# Patient Record
Sex: Male | Born: 1945 | Hispanic: No | Marital: Married | Smoking: Former smoker
Health system: Southern US, Community
[De-identification: ages and names within clinical notes are randomized; demographics above are authoritative.]

## PROBLEM LIST (undated history)

## (undated) DIAGNOSIS — Z8719 Personal history of other diseases of the digestive system: Secondary | ICD-10-CM

## (undated) DIAGNOSIS — R55 Syncope and collapse: Secondary | ICD-10-CM

## (undated) DIAGNOSIS — R509 Fever, unspecified: Secondary | ICD-10-CM

## (undated) DIAGNOSIS — N419 Inflammatory disease of prostate, unspecified: Secondary | ICD-10-CM

## (undated) DIAGNOSIS — Z973 Presence of spectacles and contact lenses: Secondary | ICD-10-CM

## (undated) DIAGNOSIS — R251 Tremor, unspecified: Secondary | ICD-10-CM

## (undated) DIAGNOSIS — I10 Essential (primary) hypertension: Secondary | ICD-10-CM

## (undated) DIAGNOSIS — W19XXXA Unspecified fall, initial encounter: Secondary | ICD-10-CM

## (undated) DIAGNOSIS — G4730 Sleep apnea, unspecified: Secondary | ICD-10-CM

## (undated) DIAGNOSIS — K219 Gastro-esophageal reflux disease without esophagitis: Secondary | ICD-10-CM

## (undated) HISTORY — PX: HIATAL HERNIA REPAIR: SHX195

## (undated) HISTORY — DX: Inflammatory disease of prostate, unspecified: N41.9

## (undated) HISTORY — DX: Fever, unspecified: R50.9

## (undated) HISTORY — PX: EYE SURGERY: SHX253

## (undated) HISTORY — PX: BACK SURGERY: SHX140

## (undated) HISTORY — PX: KNEE ARTHROSCOPY: SHX127

## (undated) HISTORY — PX: PATELLA FRACTURE SURGERY: SHX735

---

## 2001-07-16 ENCOUNTER — Encounter: Admission: RE | Admit: 2001-07-16 | Discharge: 2001-07-16 | Payer: Self-pay | Admitting: Orthopaedic Surgery

## 2001-07-16 ENCOUNTER — Encounter: Payer: Self-pay | Admitting: Orthopaedic Surgery

## 2002-05-24 ENCOUNTER — Encounter: Payer: Self-pay | Admitting: Cardiology

## 2002-05-24 ENCOUNTER — Inpatient Hospital Stay (HOSPITAL_COMMUNITY): Admission: EM | Admit: 2002-05-24 | Discharge: 2002-05-27 | Payer: Self-pay

## 2002-05-27 ENCOUNTER — Encounter: Payer: Self-pay | Admitting: Cardiology

## 2002-09-03 ENCOUNTER — Ambulatory Visit (HOSPITAL_COMMUNITY): Admission: RE | Admit: 2002-09-03 | Discharge: 2002-09-03 | Payer: Self-pay | Admitting: Gastroenterology

## 2004-03-23 ENCOUNTER — Encounter: Admission: RE | Admit: 2004-03-23 | Discharge: 2004-03-23 | Payer: Self-pay | Admitting: Family Medicine

## 2004-06-13 ENCOUNTER — Ambulatory Visit (HOSPITAL_COMMUNITY): Admission: RE | Admit: 2004-06-13 | Discharge: 2004-06-13 | Payer: Self-pay | Admitting: Internal Medicine

## 2005-01-24 ENCOUNTER — Ambulatory Visit: Payer: Self-pay | Admitting: Internal Medicine

## 2005-01-31 ENCOUNTER — Ambulatory Visit (HOSPITAL_COMMUNITY): Admission: RE | Admit: 2005-01-31 | Discharge: 2005-01-31 | Payer: Self-pay | Admitting: Internal Medicine

## 2005-02-08 ENCOUNTER — Ambulatory Visit: Payer: Self-pay | Admitting: Internal Medicine

## 2005-03-22 ENCOUNTER — Ambulatory Visit: Payer: Self-pay | Admitting: Internal Medicine

## 2005-08-12 ENCOUNTER — Encounter: Admission: RE | Admit: 2005-08-12 | Discharge: 2005-08-12 | Payer: Self-pay | Admitting: Internal Medicine

## 2005-10-05 ENCOUNTER — Ambulatory Visit: Payer: Self-pay | Admitting: Internal Medicine

## 2008-07-14 ENCOUNTER — Encounter: Payer: Self-pay | Admitting: Internal Medicine

## 2008-09-03 DIAGNOSIS — I712 Thoracic aortic aneurysm, without rupture, unspecified: Secondary | ICD-10-CM | POA: Insufficient documentation

## 2008-09-03 DIAGNOSIS — E119 Type 2 diabetes mellitus without complications: Secondary | ICD-10-CM

## 2008-09-03 DIAGNOSIS — M129 Arthropathy, unspecified: Secondary | ICD-10-CM | POA: Insufficient documentation

## 2008-09-03 DIAGNOSIS — E1165 Type 2 diabetes mellitus with hyperglycemia: Secondary | ICD-10-CM | POA: Insufficient documentation

## 2008-09-03 DIAGNOSIS — E669 Obesity, unspecified: Secondary | ICD-10-CM | POA: Insufficient documentation

## 2008-09-03 DIAGNOSIS — E785 Hyperlipidemia, unspecified: Secondary | ICD-10-CM | POA: Insufficient documentation

## 2008-09-04 ENCOUNTER — Ambulatory Visit: Payer: Self-pay | Admitting: Internal Medicine

## 2008-09-04 DIAGNOSIS — J309 Allergic rhinitis, unspecified: Secondary | ICD-10-CM | POA: Insufficient documentation

## 2008-09-04 LAB — CONVERTED CEMR LAB
Basophils Absolute: 0 10*3/uL (ref 0.0–0.1)
Basophils Relative: 0.3 % (ref 0.0–3.0)
Eosinophils Absolute: 0.2 10*3/uL (ref 0.0–0.7)
Eosinophils Relative: 4.3 % (ref 0.0–5.0)
HCT: 40.9 % (ref 39.0–52.0)
Hemoglobin: 13.4 g/dL (ref 13.0–17.0)
IgE (Immunoglobulin E), Serum: 523.6 intl units/mL — ABNORMAL HIGH (ref 0.0–180.0)
Lymphocytes Relative: 26.3 % (ref 12.0–46.0)
MCHC: 32.9 g/dL (ref 30.0–36.0)
MCV: 85.1 fL (ref 78.0–100.0)
Monocytes Absolute: 0.4 10*3/uL (ref 0.1–1.0)
Monocytes Relative: 7 % (ref 3.0–12.0)
Neutro Abs: 3.2 10*3/uL (ref 1.4–7.7)
Neutrophils Relative %: 62.1 % (ref 43.0–77.0)
Platelets: 227 10*3/uL (ref 150–400)
RBC: 4.8 M/uL (ref 4.22–5.81)
RDW: 15.1 % — ABNORMAL HIGH (ref 11.5–14.6)
WBC: 5.1 10*3/uL (ref 4.5–10.5)

## 2008-09-12 DIAGNOSIS — I10 Essential (primary) hypertension: Secondary | ICD-10-CM | POA: Insufficient documentation

## 2008-09-12 DIAGNOSIS — G4730 Sleep apnea, unspecified: Secondary | ICD-10-CM | POA: Insufficient documentation

## 2008-09-12 DIAGNOSIS — R05 Cough: Secondary | ICD-10-CM

## 2008-09-12 DIAGNOSIS — R059 Cough, unspecified: Secondary | ICD-10-CM | POA: Insufficient documentation

## 2008-09-18 ENCOUNTER — Encounter: Payer: Self-pay | Admitting: Internal Medicine

## 2008-09-30 ENCOUNTER — Ambulatory Visit: Payer: Self-pay | Admitting: Internal Medicine

## 2008-10-09 DIAGNOSIS — J45991 Cough variant asthma: Secondary | ICD-10-CM | POA: Insufficient documentation

## 2008-10-23 ENCOUNTER — Ambulatory Visit: Payer: Self-pay | Admitting: Internal Medicine

## 2009-11-15 ENCOUNTER — Encounter: Admission: RE | Admit: 2009-11-15 | Discharge: 2009-11-15 | Payer: Self-pay | Admitting: Internal Medicine

## 2009-11-29 ENCOUNTER — Ambulatory Visit (HOSPITAL_COMMUNITY): Admission: RE | Admit: 2009-11-29 | Discharge: 2009-11-30 | Payer: Self-pay | Admitting: General Surgery

## 2009-12-25 HISTORY — PX: OTHER SURGICAL HISTORY: SHX169

## 2010-03-17 ENCOUNTER — Ambulatory Visit (HOSPITAL_COMMUNITY): Admission: RE | Admit: 2010-03-17 | Discharge: 2010-03-17 | Payer: Self-pay | Admitting: Gastroenterology

## 2010-12-20 ENCOUNTER — Observation Stay (HOSPITAL_COMMUNITY)
Admission: RE | Admit: 2010-12-20 | Discharge: 2010-12-21 | Payer: Self-pay | Source: Home / Self Care | Attending: General Surgery | Admitting: General Surgery

## 2011-02-13 NOTE — Discharge Summary (Signed)
  NAME:  Kevin [name], HANNIS NO.:  [id number]  MEDICAL RECORD NO.:  [id number]          PATIENT TYPE:  OBV  LOCATION:  5126                         FACILITY:  [facility]  PHYSICIAN:  Cherylynn Ridges, M.D.    DATE OF BIRTH:  [birth year]  DATE OF ADMISSION:  12/20/2010 DATE OF DISCHARGE:  12/21/2010                              DISCHARGE SUMMARY   DISCHARGE DIAGNOSES:  Ventral hernia, comorbidities, non-insulin- dependent diabetes mellitus.  PRINCIPAL PROCEDURE:  Laparoscopic ventral hernia repair with Physiomesh.  SURGEON:  Cherylynn Ridges, MD.  ASSISTANT:  Dr. Janee Morn.  ANESTHESIA:  General endotracheal anesthesia.  He was discharged to home in care of his family.  DIET ON DISCHARGE:  Carbohydrate modified.  CONDITION:  Stable.  He had a postoperative med reconciliation sheet performed.  BRIEF SUMMARY AND HOSPITAL COURSE:  The patient was admitted just overnight after a laparoscopic ventral hernia repair, which went very well.  A piece of artificial mesh and Physiomesh was used to cover the defect.  He was secured in with a SecureStrap stapler.  Postoperatively, he did well with a binder in place, and was discharged home on a binder, and tolerating diabetic modified diet.  He will return to see me in 2 weeks.     Cherylynn Ridges, M.D.     JOW/MEDQ  D:  02/01/2011  T:  02/02/2011  Job:  [id number]  Electronically Signed by Jimmye Norman M.D. on 02/13/2011 12:44:13 PM

## 2011-03-06 LAB — DIFFERENTIAL
Basophils Absolute: 0.1 10*3/uL (ref 0.0–0.1)
Basophils Relative: 1 % (ref 0–1)
Eosinophils Absolute: 0.3 10*3/uL (ref 0.0–0.7)
Eosinophils Relative: 4 % (ref 0–5)
Lymphocytes Relative: 30 % (ref 12–46)
Lymphs Abs: 1.8 10*3/uL (ref 0.7–4.0)
Monocytes Absolute: 0.5 10*3/uL (ref 0.1–1.0)
Monocytes Relative: 7 % (ref 3–12)
Neutro Abs: 3.5 10*3/uL (ref 1.7–7.7)
Neutrophils Relative %: 57 % (ref 43–77)

## 2011-03-06 LAB — CBC
HCT: 40.8 % (ref 39.0–52.0)
Hemoglobin: 13 g/dL (ref 13.0–17.0)
MCH: 26.6 pg (ref 26.0–34.0)
MCHC: 31.9 g/dL (ref 30.0–36.0)
MCV: 83.6 fL (ref 78.0–100.0)
Platelets: 218 10*3/uL (ref 150–400)
RBC: 4.88 MIL/uL (ref 4.22–5.81)
RDW: 15.6 % — ABNORMAL HIGH (ref 11.5–15.5)
WBC: 6.1 10*3/uL (ref 4.0–10.5)

## 2011-03-06 LAB — GLUCOSE, CAPILLARY
Glucose-Capillary: 147 mg/dL — ABNORMAL HIGH (ref 70–99)
Glucose-Capillary: 184 mg/dL — ABNORMAL HIGH (ref 70–99)
Glucose-Capillary: 210 mg/dL — ABNORMAL HIGH (ref 70–99)
Glucose-Capillary: 253 mg/dL — ABNORMAL HIGH (ref 70–99)
Glucose-Capillary: 257 mg/dL — ABNORMAL HIGH (ref 70–99)
Glucose-Capillary: 257 mg/dL — ABNORMAL HIGH (ref 70–99)

## 2011-03-06 LAB — BASIC METABOLIC PANEL
BUN: 9 mg/dL (ref 6–23)
CO2: 28 mEq/L (ref 19–32)
Calcium: 9.4 mg/dL (ref 8.4–10.5)
Chloride: 103 mEq/L (ref 96–112)
Creatinine, Ser: 1.05 mg/dL (ref 0.4–1.5)
GFR calc Af Amer: >60 mL/min (ref >60)
GFR calc non Af Amer: >60 mL/min (ref >60)
Glucose, Bld: 177 mg/dL — ABNORMAL HIGH (ref 70–99)
Potassium: 4.5 mEq/L (ref 3.5–5.1)
Sodium: 138 mEq/L (ref 135–145)

## 2011-03-06 LAB — SURGICAL PCR SCREEN
MRSA, PCR: NEGATIVE
Staphylococcus aureus: POSITIVE — AB

## 2011-03-20 ENCOUNTER — Other Ambulatory Visit: Payer: Self-pay | Admitting: Orthopaedic Surgery

## 2011-03-20 DIAGNOSIS — M545 Low back pain, unspecified: Secondary | ICD-10-CM

## 2011-03-20 LAB — GLUCOSE, CAPILLARY: Glucose-Capillary: 124 mg/dL — ABNORMAL HIGH (ref 70–99)

## 2011-03-27 ENCOUNTER — Ambulatory Visit
Admission: RE | Admit: 2011-03-27 | Discharge: 2011-03-27 | Disposition: A | Discharge status: Home / Self Care | Payer: Medicare Other | Source: Ambulatory Visit | Attending: Orthopaedic Surgery | Admitting: Orthopaedic Surgery

## 2011-03-27 DIAGNOSIS — M545 Low back pain, unspecified: Secondary | ICD-10-CM

## 2011-03-28 LAB — DIFFERENTIAL
Basophils Absolute: 0.1 10*3/uL (ref 0.0–0.1)
Basophils Relative: 1 % (ref 0–1)
Eosinophils Absolute: 0.3 10*3/uL (ref 0.0–0.7)
Eosinophils Relative: 5 % (ref 0–5)
Lymphocytes Relative: 24 % (ref 12–46)
Lymphs Abs: 1.4 10*3/uL (ref 0.7–4.0)
Monocytes Absolute: 0.6 10*3/uL (ref 0.1–1.0)
Monocytes Relative: 10 % (ref 3–12)
Neutro Abs: 3.4 10*3/uL (ref 1.7–7.7)
Neutrophils Relative %: 60 % (ref 43–77)

## 2011-03-28 LAB — GLUCOSE, CAPILLARY
Glucose-Capillary: 126 mg/dL — ABNORMAL HIGH (ref 70–99)
Glucose-Capillary: 138 mg/dL — ABNORMAL HIGH (ref 70–99)
Glucose-Capillary: 147 mg/dL — ABNORMAL HIGH (ref 70–99)
Glucose-Capillary: 148 mg/dL — ABNORMAL HIGH (ref 70–99)
Glucose-Capillary: 151 mg/dL — ABNORMAL HIGH (ref 70–99)
Glucose-Capillary: 98 mg/dL (ref 70–99)

## 2011-03-28 LAB — COMPREHENSIVE METABOLIC PANEL
ALT: 22 U/L (ref 0–53)
AST: 21 U/L (ref 0–37)
Albumin: 3.4 g/dL — ABNORMAL LOW (ref 3.5–5.2)
Alkaline Phosphatase: 61 U/L (ref 39–117)
BUN: 11 mg/dL (ref 6–23)
CO2: 29 mEq/L (ref 19–32)
Calcium: 9.1 mg/dL (ref 8.4–10.5)
Chloride: 104 mEq/L (ref 96–112)
Creatinine, Ser: 1.02 mg/dL (ref 0.4–1.5)
GFR calc Af Amer: >60 mL/min (ref >60)
GFR calc non Af Amer: >60 mL/min (ref >60)
Glucose, Bld: 165 mg/dL — ABNORMAL HIGH (ref 70–99)
Potassium: 4.5 mEq/L (ref 3.5–5.1)
Sodium: 139 mEq/L (ref 135–145)
Total Bilirubin: 0.6 mg/dL (ref 0.3–1.2)
Total Protein: 7.2 g/dL (ref 6.0–8.3)

## 2011-03-28 LAB — CBC
HCT: 35.9 % — ABNORMAL LOW (ref 39.0–52.0)
Hemoglobin: 11.7 g/dL — ABNORMAL LOW (ref 13.0–17.0)
MCHC: 32.6 g/dL (ref 30.0–36.0)
MCV: 78.4 fL (ref 78.0–100.0)
Platelets: 281 10*3/uL (ref 150–400)
RBC: 4.58 MIL/uL (ref 4.22–5.81)
RDW: 16.7 % — ABNORMAL HIGH (ref 11.5–15.5)
WBC: 5.7 10*3/uL (ref 4.0–10.5)

## 2011-05-12 NOTE — H&P (Signed)
[place]  Patient:    Kevin [name], Kevin [name] Visit Number: [id number] MRN: [id number]          Service Type: MED Location: 5500 5524 02 Attending Physician:  Mirian Mo Dictated by:   Jesse Sans Wall, M.D. [facility] Admit Date:  05/24/2002 Discharge Date: 05/27/2002   CC:         Thora Lance, M.D.   History and Physical  CHIEF COMPLAINT:  "When I first got up this morning, I felt some tightness across my chest and some left arm numbness."  HISTORY OF PRESENT ILLNESS:  Mr. Kevin [name] is a delightful 65 year old African-American married male, who used to be a Producer, television/film/video, but now works for [employer].  He has been having episodic discomfort over the past week.  This morning, he noted the above complaint while working on a vacuum cleaner. It lasted for about 30 minutes.  He came to the emergency room where he was found to be mildly hypertensive, but no acute distress.  His EKG is essentially normal.  Initial enzymes were negative.  PAST MEDICAL HISTORY:  He has multiple cardiac risk factors including age, sex, type 2 diabetes, hypertension, and hyperlipidemia.  These are all being treated.  He does not smoke.  He is also overweight.  ALLERGIES:  He has no known drug allergies.  CURRENT MEDICATIONS: 1. Glucophage 1000 mg p.o. b.i.d. 2. Actos 45 mg p.o. q.d. 3. Amaryl 4 mg p.o. q.d. 4. Lotrel 5/20 mg one p.o. q.d. 5. Pravachol 20 mg p.o. q.h.s. 6. Aspirin 325 a day.  PAST SURGICAL HISTORY:  He has had previous right knee arthroscopy in September 2002.  SOCIAL HISTORY:  He lives in [place] with his wife.  He works at [employer].  He has not smoked in 30 years.  He rarely drinks alcohol.  He does not exercise regularly.  FAMILY HISTORY:  His mother had coronary disease and angina and died of cancer at age 71.  His father had coronary disease with an MI and died at age 59.  He has no siblings.  REVIEW OF  SYSTEMS:  Other than the above HPI is unremarkable.  PHYSICAL EXAMINATION:  GENERAL:  He is a very pleasant and intelligent male in no acute distress.  VITAL SIGNS:  Blood pressure 143/93, his pulse was 91 and regular.  He is in sinus rhythm.  Temperature is 99.3, his respiratory rate is 20 and unlabored. O2 saturations are adequate.  SKIN:  Warm and dry.  Exam is unremarkable.  HEENT:  Reveals him to be normocephalic, atraumatic.  Pupils equally responsive, reactive to light and accommodation.  Extraocular movements are intact.  Sclerae clear.  He has good dentition.  NECK:  Shows no JVD.  Carotid upstrokes are equal bilaterally without bruits. There is no thyromegaly.  Trachea is midline.  LUNGS:  Clear to auscultation and percussion.  HEART:  Reveals regular rate and rhythm without gallop, rub, or murmur.  ABDOMEN:  Soft, good bowel sounds.  There is no midline bruit.  There is no obvious tenderness or hepatomegaly.  EXTREMITIES:  There is no cyanosis, clubbing, or edema.  Pulses were brisk bilaterally.  LABORATORY:  His chest x-ray shows mild cardiomegaly, but is a portable film. He has a questionable left lower lobe nodule.  Two view chest is recommended.  EKG shows normal sinus rhythm and no ST segment changes.  Remarkable laboratory data shows negative CPK-MB and troponin x1.  His blood  sugar is 103, his creatinine is 1.0.  ASSESSMENT/PLAN: 1. Chest discomfort with left arm numbness, worrisome for angina or ischemic    symptoms.  The patient has multiple cardiac risk factors, and I suspect he    has obstructive coronary artery disease. 2. Type 2 diabetes. 3. Hypertension. 4. Hyperlipidemia. 5. Family history of coronary disease. 6. Questionable lung nodule on chest x-ray.  PLAN: 1. Admit to telemetry. 2. Lovenox and continue his current medications.  Will also add a beta    blocker. 3. Stop Glucophage the evening before the catheterization on Monday. 4. Two  view chest x-ray to rule out left lower lobe nodule.  Indications, risks, potential benefits of the procedure, being catheterization on Monday, have been discussed with him and his wife.  They are in agreement to proceed. Dictated by:   Jesse Sans Wall, M.D. [facility] Attending Physician:  Mirian Mo DD:  05/24/02 TD:  05/25/02 Job: [id number]

## 2011-05-12 NOTE — Discharge Summary (Signed)
[place]. [facility]  Patient:    Kevin [name], Kevin [name] Visit Number: [id number] MRN: [id number]          Service Type: MED Location: 5500 5524 02 Attending Physician:  Mirian Mo Dictated by:   Jacolyn Reedy, P.A.C. Admit Date:  05/24/2002 Disc. Date: 05/27/02                    Referring Physician Discharge [place]  ADMITTING DIAGNOSIS:  Chest pain.  DISCHARGE DIAGNOSES: 1. Chest pain, question etiology.  Cardiac catheterization on May 26, 2002    revealed 40-50% proximal left anterior descending artery, normal left    ventricular function. 2. Mildly elevated D-dimer.  CT scan negative for pulmonary embolus. 3. Diabetes mellitus. 4. Hypertension. 5. Hyperlipidemia. 6. Family history of coronary artery disease with father died at 71 of a    myocardial infarction.  BRIEF HISTORY AND PHYSICAL/HOSPITAL COURSE:  Please see dictated H&P for details.  This is a 65 year old African-American married male patient who presented with chest tightness associated with left arm numbness while working on a vacuum cleaner.  The pain lasted approximately 30 minutes.  On arrival to the emergency room he was mildly hypotensive and EKG was normal with initial enzymes being normal.  He was admitted to rule out MI and cardiac catheterization was recommended because of his chest pain and multiple cardiac risk factors.  Chest x-ray also questioned a lung nodule but two-view chest x-ray said it was atelectasis.  Cardiac catheterization was performed on May 26, 2002 by Dr. Bonnee Quin, revealed a 40-50% LAD, 40% diagonal, normal RCA, normal LV function.  The patient tolerated the procedure well.  Right groin was stable without hematoma or hemorrhage.  Dr. 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  The patient was discharged home on  May 27, 2002 in stable condition.  LABORATORY VALUES:  INR 1.0.  D-dimer was slightly elevated at 0.67. Hemoglobin 13.4, hematocrit 40, white count 4.5, platelets 182.  Sodium 139, potassium 4.0, chloride 105, CO2 28, BUN 12, creatinine 1.1.  CK-MB and troponin negative.  Cholesterol 130, triglycerides 111, HDL 40, LDL 68.  TSH 1.489.  Initial chest x-ray showed question of a nodular density in the left base laterally, recommend two-view; no acute infiltrate or edema; cardiomegaly. Two-view showed low volume chest film with vascular crowding and bibasilar atelectasis.  A vague nodular density at the left lung on the previous portable film was not seen on the two-view study.  CT scan of his chest showed no evidence of pulmonary embolus with mild aneurysmal dilatation of the ascending thoracic aorta at 5.1 cm.  EKG showed normal sinus rhythm, nonspecific ST wave changes, no acute change.  DISPOSITION:  The patient is discharged home in stable condition on the following medications.  MEDICATIONS: 1. Glucophage 1000 mg b.i.d. 2. Actos 45 mg q.d. 3. Amaryl 4 mg q.d. 4. Lotrel 5/20 mg q.d. 5. Pravachol 20 mg q.h.s. 6. Aspirin once a day. 7. Nitroglycerin p.r.n.  ACTIVITY:  He is to do no heavy lifting or strenuous activity for two to three days.  DIET:  He is to follow a low salt/low fat diet.  FOLLOW-UP:  He has an appointment to see Dr. Daleen Squibb back in approximately two weeks and should see Dr. Kirby Funk - his primary M.D. - back as needed. Dictated by:  Jacolyn Reedy, P.A.C. Attending Physician:  Mirian Mo DD:  05/27/02 TD:  05/27/02 Job: [id number]

## 2011-05-12 NOTE — Op Note (Signed)
   Kevin [name], Kevin [name]                         ACCOUNT NO.:  [id number]   MEDICAL RECORD NO.:  [id number]                   PATIENT TYPE:  AMB   LOCATION:  ENDO                                 FACILITY:  [facility]   PHYSICIAN:  Charolett Bumpers, M.D.             DATE OF BIRTH:  09-12-1946   DATE OF PROCEDURE:  09/03/2002  DATE OF DISCHARGE:                                 OPERATIVE REPORT   PROCEDURE:  Screening colonoscopy.   PROCEDURE INDICATION:  The patient is a 65 year old male, born December 06, 1946.  The patient is scheduled to undergo his first screening colonoscopy  with polypectomy to prevent colon cancer.  I discussed with the patient the  complications associated with colonoscopy and polypectomy including a 15 per  1000 risk of bleeding and 1 per 1000 risk of colon perforation requiring  surgical repair.  The patient has signed the operative permit.   ENDOSCOPIST:  Charolett Bumpers, M.D.   PREMEDICATION:  Versed 10 mg, Demerol 75 mg.   ENDOSCOPE:  Olympus pediatric colonoscope.   DESCRIPTION OF PROCEDURE:  After obtaining informed consent, the patient was  placed in the left lateral decubitus position.  I administered intravenous  Demerol and intravenous Versed to achieve conscious sedation for the  procedure.  The patient's blood pressure, oxygen saturation, and cardiac  rhythm were monitored throughout the procedure and documented in the medical  record.   Anal inspection was normal.  Digital rectal exam revealed a nonnodular  prostate.  The Olympus pediatric video colonoscope was introduced into the  rectum and advanced to the cecum.  Colonic preparation for the exam today  was excellent.   RECTUM:  Normal.  SIGMOID COLON AND DESCENDING COLON:  Extensive left colonic diverticulosis.  SPLENIC FLEXURE:  Normal.  TRANSVERSE COLON:  Normal.  HEPATIC FLEXURE:  Normal.  ASCENDING COLON:  Normal.  CECUM AND ILEOCECAL VALVE:  Normal.    ASSESSMENT:  Left  colonic diverticulosis; otherwise normal proctocolonoscopy  to the cecum.  No endoscopic evidence for the presence of colorectal  neoplasia.                                                 Charolett Bumpers, M.D.    MKJ/MEDQ  D:  09/03/2002  T:  09/03/2002  Job:  [id number]   cc:   Thora Lance, M.D.

## 2011-05-12 NOTE — Cardiovascular Report (Signed)
[place]. [facility]  Patient:    Kevin [name], Kevin [name] Visit Number: [id number] MRN: [id number]          Service Type: MED Location: 5500 5524 02 Attending Physician:  Mirian Mo Dictated by:   Arturo Morton Riley Kill, M.D. [facility] Proc. Date: 05/26/02 Admit Date:  05/24/2002   CC:         Thomas C. Daleen Squibb, M.D. [facility]  Thora Lance, M.D.  CV Laboratory   Cardiac Catheterization  INDICATIONS: The patient is a 65 year old who presented with chest pain. He has multiple cardiac risk factors for coronary artery disease.  PROCEDURES: 1. Left heart catheterization. 2. Selective coronary arteriography. 3. Selective left ventriculography. 4. Proximal root aortography.  DESCRIPTION OF PROCEDURE: The procedure was performed from the right femoral artery using 6 French catheters.  He tolerated the procedure without complication.  HEMODYNAMICS: 1. Central aorta 117/82. 2. Left ventricular 110/17. 3. No gradient on pullback across the aortic valve.  ANGIOGRAPHIC DATA: 1. On plane fluoroscopy there was no obvious calcification of the coronary    arteries. 2. Ventriculography was performed in the RAO projection. Overall systolic    function was well preserved and no segmental abnormalities contraction    were identified. Ejection fraction was felt to be in excess of 55%. 3. Central aortography revealed aortic root aortography with no evidence of    significant aortic regurgitation. There was mild aortic dilatation. 4. The left main coronary artery was free of critical disease. 5. The left anterior descending artery demonstrates a slightly segmental    smooth area of narrowing that measures about 40% to possibly 50%    luminal reduction. This is just beyond the septal perforator and diagonal    branch in the LAD. The area is relatively smooth and appears to be about    a 3 mm MLD when compared to the diagnostic catheter. The remainder of the    distal LAD is  without critical narrowing. 6. The circumflex provides three marginal branches and appears to be free    of critical disease. 7. The right coronary artery is a large dominant vessel. There is minimal, if    any luminal irregularity and no focal areas of stenosis.  CONCLUSIONS: 1. Preserved overall left ventricular function. 2. A 40-50% mid left anterior descending stenosis as described in the above    test.  DISPOSITION: Continued medical therapy with lipid-lowering therapy would be recommended. Followup will be with Tom Wall. There may be a benefit in the future of exercise scintigraphy to better assess the LAD. Dictated by:   Arturo Morton Riley Kill, M.D. [facility] Attending Physician:  Mirian Mo DD:  05/26/02 TD:  05/27/02 Job: [id number]

## 2011-12-26 HISTORY — PX: LUMBAR FUSION: SHX111

## 2012-02-22 ENCOUNTER — Other Ambulatory Visit: Payer: Self-pay | Admitting: Neurosurgery

## 2012-02-22 DIAGNOSIS — M48061 Spinal stenosis, lumbar region without neurogenic claudication: Secondary | ICD-10-CM

## 2012-02-29 ENCOUNTER — Ambulatory Visit
Admission: RE | Admit: 2012-02-29 | Discharge: 2012-02-29 | Disposition: A | Discharge status: Home / Self Care | Payer: Medicare Other | Source: Ambulatory Visit | Attending: Neurosurgery | Admitting: Neurosurgery

## 2012-02-29 DIAGNOSIS — M48061 Spinal stenosis, lumbar region without neurogenic claudication: Secondary | ICD-10-CM

## 2012-08-28 ENCOUNTER — Other Ambulatory Visit: Payer: Self-pay | Admitting: Neurosurgery

## 2012-08-28 ENCOUNTER — Encounter (HOSPITAL_COMMUNITY): Payer: Self-pay | Admitting: Pharmacy Technician

## 2012-08-29 ENCOUNTER — Encounter (HOSPITAL_COMMUNITY): Payer: Self-pay

## 2012-08-29 ENCOUNTER — Ambulatory Visit (HOSPITAL_COMMUNITY)
Admission: RE | Admit: 2012-08-29 | Discharge: 2012-08-29 | Disposition: A | Discharge status: Home / Self Care | Payer: Medicare Other | Source: Ambulatory Visit | Attending: Neurosurgery | Admitting: Neurosurgery

## 2012-08-29 ENCOUNTER — Encounter (HOSPITAL_COMMUNITY)
Admission: RE | Admit: 2012-08-29 | Discharge: 2012-08-29 | Disposition: A | Discharge status: Home / Self Care | Payer: Medicare Other | Source: Ambulatory Visit | Attending: Neurosurgery | Admitting: Neurosurgery

## 2012-08-29 DIAGNOSIS — M47817 Spondylosis without myelopathy or radiculopathy, lumbosacral region: Secondary | ICD-10-CM | POA: Insufficient documentation

## 2012-08-29 DIAGNOSIS — Z01810 Encounter for preprocedural cardiovascular examination: Secondary | ICD-10-CM | POA: Insufficient documentation

## 2012-08-29 DIAGNOSIS — I517 Cardiomegaly: Secondary | ICD-10-CM | POA: Insufficient documentation

## 2012-08-29 DIAGNOSIS — G4730 Sleep apnea, unspecified: Secondary | ICD-10-CM | POA: Insufficient documentation

## 2012-08-29 DIAGNOSIS — Z01812 Encounter for preprocedural laboratory examination: Secondary | ICD-10-CM | POA: Insufficient documentation

## 2012-08-29 DIAGNOSIS — Z01818 Encounter for other preprocedural examination: Secondary | ICD-10-CM | POA: Insufficient documentation

## 2012-08-29 DIAGNOSIS — E119 Type 2 diabetes mellitus without complications: Secondary | ICD-10-CM | POA: Insufficient documentation

## 2012-08-29 DIAGNOSIS — I10 Essential (primary) hypertension: Secondary | ICD-10-CM | POA: Insufficient documentation

## 2012-08-29 HISTORY — DX: Essential (primary) hypertension: I10

## 2012-08-29 HISTORY — DX: Gastro-esophageal reflux disease without esophagitis: K21.9

## 2012-08-29 HISTORY — DX: Sleep apnea, unspecified: G47.30

## 2012-08-29 LAB — CBC
HCT: 41.5 % (ref 39.0–52.0)
Hemoglobin: 13.2 g/dL (ref 13.0–17.0)
MCH: 26 pg (ref 26.0–34.0)
MCHC: 31.8 g/dL (ref 30.0–36.0)
MCV: 81.9 fL (ref 78.0–100.0)
Platelets: 261 10*3/uL (ref 150–400)
RBC: 5.07 MIL/uL (ref 4.22–5.81)
RDW: 15.4 % (ref 11.5–15.5)
WBC: 7.1 10*3/uL (ref 4.0–10.5)

## 2012-08-29 LAB — TYPE AND SCREEN
ABO/RH(D): O POS
Antibody Screen: NEGATIVE

## 2012-08-29 LAB — BASIC METABOLIC PANEL
BUN: 9 mg/dL (ref 6–23)
CO2: 26 mEq/L (ref 19–32)
Calcium: 9.9 mg/dL (ref 8.4–10.5)
Chloride: 101 mEq/L (ref 96–112)
Creatinine, Ser: 0.83 mg/dL (ref 0.50–1.35)
GFR calc Af Amer: >90 mL/min (ref >90)
GFR calc non Af Amer: 90 mL/min — ABNORMAL LOW (ref >90)
Glucose, Bld: 47 mg/dL — ABNORMAL LOW (ref 70–99)
Potassium: 3.6 mEq/L (ref 3.5–5.1)
Sodium: 139 mEq/L (ref 135–145)

## 2012-08-29 LAB — ABO/RH: ABO/RH(D): O POS

## 2012-08-29 LAB — SURGICAL PCR SCREEN
MRSA, PCR: NEGATIVE
Staphylococcus aureus: POSITIVE — AB

## 2012-08-29 MED ORDER — DEXTROSE 5 % IV SOLN
3.0000 g | INTRAVENOUS | Status: AC
  Administered 2012-08-30 (×2): 3 g via INTRAVENOUS
  Filled 2012-08-29: qty 3000

## 2012-08-29 NOTE — Progress Notes (Addendum)
Pt was seen today for a pre op visit.  Pt had a cardiac cath in 2003, has not seen a cardiologist since then.  50%  narrowing in LAD , otherwise clear cath. He is  followed by Dr Kirby Funk medically.  Pt said he didn't need to see a cardiologist.  Pt denies chest pain , shortness of breathe.  Pt has hx of HTN, DM.  I informed Dr Krista Blue of history, no new orders were given.  I received last office notes from Dr Jone Baseman office, I called back to see if there were any EKGs on his record- none were found.

## 2012-08-29 NOTE — Pre-Procedure Instructions (Signed)
20 DOVBER ERNEST  08/29/2012   Your procedure is scheduled on: Friday, September 6th.  Report to [facility] at 5:30AM.   Call this number if you have problems the morning of surgery: [phone]   Remember:   Do not eat food or anything to drink:After Midnight    Take these medicines the morning of surgery with A SIP OF WATER: Amolodipine (Norvasc), Omeprazole (Prilosec).   Do not wear jewelry, make-up or nail polish.  Do not wear lotions, powders, or perfumes. You may wear deodorant.  Do not shave 48 hours prior to surgery. Men may shave face and neck.  Do not bring valuables to the hospital.  Contacts, dentures or bridgework may not be worn into surgery.  Leave suitcase in the car. After surgery it may be brought to your room.  For patients admitted to the hospital, checkout time is 11:00 AM the day of discharge.   Patients discharged the day of surgery will not be allowed to drive home.  Name and phone number of your driver: NA  Special Instructions: CHG Shower Use Special Wash: 1/2 bottle night before surgery and 1/2 bottle morning of surgery.   Please read over the following fact sheets that you were given: Pain Booklet, Coughing and Deep Breathing, Blood Transfusion Information and Surgical Site Infection Prevention

## 2012-08-29 NOTE — Progress Notes (Signed)
I notified Dr Krista Blue of todays XRay results- NSR with incomplete Right BBB and also about hx of Thoracic aneurysm.  No new orders.

## 2012-08-30 ENCOUNTER — Encounter (HOSPITAL_COMMUNITY): Payer: Self-pay | Admitting: Anesthesiology

## 2012-08-30 ENCOUNTER — Inpatient Hospital Stay (HOSPITAL_COMMUNITY): Payer: Medicare Other

## 2012-08-30 ENCOUNTER — Encounter (HOSPITAL_COMMUNITY): Payer: Self-pay | Admitting: *Deleted

## 2012-08-30 ENCOUNTER — Inpatient Hospital Stay (HOSPITAL_COMMUNITY): Payer: Medicare Other | Admitting: Anesthesiology

## 2012-08-30 ENCOUNTER — Inpatient Hospital Stay (HOSPITAL_COMMUNITY)
Admission: RE | Admit: 2012-08-30 | Discharge: 2012-09-04 | DRG: 460 | Disposition: A | Discharge status: Home / Self Care | Payer: Medicare Other | Source: Ambulatory Visit | Attending: Neurosurgery | Admitting: Neurosurgery

## 2012-08-30 ENCOUNTER — Encounter (HOSPITAL_COMMUNITY)
Admission: RE | Disposition: A | Discharge status: Home / Self Care | Payer: Self-pay | Source: Ambulatory Visit | Attending: Neurosurgery

## 2012-08-30 DIAGNOSIS — Z79899 Other long term (current) drug therapy: Secondary | ICD-10-CM

## 2012-08-30 DIAGNOSIS — Z794 Long term (current) use of insulin: Secondary | ICD-10-CM

## 2012-08-30 DIAGNOSIS — E119 Type 2 diabetes mellitus without complications: Secondary | ICD-10-CM | POA: Diagnosis present

## 2012-08-30 DIAGNOSIS — Z7982 Long term (current) use of aspirin: Secondary | ICD-10-CM

## 2012-08-30 DIAGNOSIS — M4316 Spondylolisthesis, lumbar region: Secondary | ICD-10-CM

## 2012-08-30 DIAGNOSIS — M51379 Other intervertebral disc degeneration, lumbosacral region without mention of lumbar back pain or lower extremity pain: Secondary | ICD-10-CM | POA: Diagnosis present

## 2012-08-30 DIAGNOSIS — M4310 Spondylolisthesis, site unspecified: Principal | ICD-10-CM | POA: Diagnosis present

## 2012-08-30 DIAGNOSIS — M5137 Other intervertebral disc degeneration, lumbosacral region: Secondary | ICD-10-CM | POA: Diagnosis present

## 2012-08-30 DIAGNOSIS — M48061 Spinal stenosis, lumbar region without neurogenic claudication: Secondary | ICD-10-CM | POA: Diagnosis present

## 2012-08-30 LAB — POCT I-STAT 4, (NA,K, GLUC, HGB,HCT)
Glucose, Bld: 103 mg/dL — ABNORMAL HIGH (ref 70–99)
Glucose, Bld: 156 mg/dL — ABNORMAL HIGH (ref 70–99)
HCT: 40 % (ref 39.0–52.0)
HCT: 39 % (ref 39.0–52.0)
Hemoglobin: 13.6 g/dL (ref 13.0–17.0)
Hemoglobin: 13.3 g/dL (ref 13.0–17.0)
Potassium: 3.8 mEq/L (ref 3.5–5.1)
Potassium: 4.6 mEq/L (ref 3.5–5.1)
Sodium: 141 mEq/L (ref 135–145)
Sodium: 140 mEq/L (ref 135–145)

## 2012-08-30 LAB — GLUCOSE, CAPILLARY
Glucose-Capillary: 167 mg/dL — ABNORMAL HIGH (ref 70–99)
Glucose-Capillary: 134 mg/dL — ABNORMAL HIGH (ref 70–99)

## 2012-08-30 SURGERY — POSTERIOR LUMBAR FUSION 2 LEVEL
Anesthesia: General | Site: Spine Lumbar | Wound class: Clean

## 2012-08-30 MED ORDER — DIAZEPAM 5 MG PO TABS
5.0000 mg | ORAL_TABLET | Freq: Four times a day (QID) | ORAL | Status: DC | PRN
  Administered 2012-09-01 – 2012-09-03 (×3): 5 mg via ORAL
  Filled 2012-08-30 (×3): qty 1

## 2012-08-30 MED ORDER — LACTATED RINGERS IV SOLN
INTRAVENOUS | Status: DC | PRN
  Administered 2012-08-30 (×3): via INTRAVENOUS

## 2012-08-30 MED ORDER — SODIUM CHLORIDE 0.9 % IJ SOLN: 3.0000 mL | INTRAMUSCULAR | Status: DC | PRN

## 2012-08-30 MED ORDER — MIDAZOLAM HCL 5 MG/5ML IJ SOLN
INTRAMUSCULAR | Status: DC | PRN
  Administered 2012-08-30: 2 mg via INTRAVENOUS

## 2012-08-30 MED ORDER — VECURONIUM BROMIDE 10 MG IV SOLR
INTRAVENOUS | Status: DC | PRN
  Administered 2012-08-30: 10 mg via INTRAVENOUS
  Administered 2012-08-30: 1 mg via INTRAVENOUS
  Administered 2012-08-30: 2 mg via INTRAVENOUS
  Administered 2012-08-30 (×2): 1 mg via INTRAVENOUS
  Administered 2012-08-30 (×3): 2 mg via INTRAVENOUS
  Administered 2012-08-30: 1 mg via INTRAVENOUS

## 2012-08-30 MED ORDER — ALBUMIN HUMAN 5 % IV SOLN
INTRAVENOUS | Status: DC | PRN
  Administered 2012-08-30: 14:00:00 via INTRAVENOUS

## 2012-08-30 MED ORDER — THROMBIN 20000 UNITS EX KIT
PACK | CUTANEOUS | Status: DC | PRN
  Administered 2012-08-30: 09:00:00 via TOPICAL

## 2012-08-30 MED ORDER — BUPIVACAINE LIPOSOME 1.3 % IJ SUSP
20.0000 mL | INTRAMUSCULAR | Status: DC
  Filled 2012-08-30: qty 20

## 2012-08-30 MED ORDER — HEPARIN SODIUM (PORCINE) 1000 UNIT/ML IJ SOLN
INTRAMUSCULAR | Status: AC
  Filled 2012-08-30: qty 1

## 2012-08-30 MED ORDER — ACETAMINOPHEN 10 MG/ML IV SOLN
INTRAVENOUS | Status: DC | PRN
  Administered 2012-08-30: 1000 mg via INTRAVENOUS

## 2012-08-30 MED ORDER — NEOSTIGMINE METHYLSULFATE 1 MG/ML IJ SOLN
INTRAMUSCULAR | Status: DC | PRN
  Administered 2012-08-30: 4 mg via INTRAVENOUS

## 2012-08-30 MED ORDER — SUFENTANIL CITRATE 50 MCG/ML IV SOLN
INTRAVENOUS | Status: DC | PRN
  Administered 2012-08-30 (×5): 10 ug via INTRAVENOUS

## 2012-08-30 MED ORDER — HYDROMORPHONE HCL PF 1 MG/ML IJ SOLN
0.2500 mg | INTRAMUSCULAR | Status: DC | PRN
  Administered 2012-08-30 (×3): 0.5 mg via INTRAVENOUS

## 2012-08-30 MED ORDER — IRBESARTAN 150 MG PO TABS
150.0000 mg | ORAL_TABLET | Freq: Every day | ORAL | Status: DC
  Administered 2012-08-31 – 2012-09-03 (×4): 150 mg via ORAL
  Filled 2012-08-30 (×6): qty 1

## 2012-08-30 MED ORDER — INSULIN GLARGINE 100 UNIT/ML ~~LOC~~ SOLN
100.0000 [IU] | Freq: Every day | SUBCUTANEOUS | Status: DC
  Administered 2012-08-30: 100 [IU] via SUBCUTANEOUS

## 2012-08-30 MED ORDER — SODIUM CHLORIDE 0.9 % IJ SOLN: 9.0000 mL | INTRAMUSCULAR | Status: DC | PRN

## 2012-08-30 MED ORDER — ACETAMINOPHEN 10 MG/ML IV SOLN
1000.0000 mg | Freq: Four times a day (QID) | INTRAVENOUS | Status: AC
  Administered 2012-08-31 (×4): 1000 mg via INTRAVENOUS
  Filled 2012-08-30 (×4): qty 100

## 2012-08-30 MED ORDER — MENTHOL 3 MG MT LOZG: 1.0000 | LOZENGE | OROMUCOSAL | Status: DC | PRN

## 2012-08-30 MED ORDER — INSULIN ASPART 100 UNIT/ML ~~LOC~~ SOLN
40.0000 [IU] | Freq: Three times a day (TID) | SUBCUTANEOUS | Status: DC
Start: 2012-08-31 — End: 2012-08-31

## 2012-08-30 MED ORDER — OXYCODONE HCL 5 MG/5ML PO SOLN: 5.0000 mg | Freq: Once | ORAL | Status: DC | PRN

## 2012-08-30 MED ORDER — POTASSIUM CHLORIDE IN NACL 20-0.9 MEQ/L-% IV SOLN
INTRAVENOUS | Status: DC
  Administered 2012-08-31 – 2012-09-01 (×2): via INTRAVENOUS
  Administered 2012-09-01 – 2012-09-02 (×2): 1000 mL via INTRAVENOUS
  Filled 2012-08-30 (×13): qty 1000

## 2012-08-30 MED ORDER — FUROSEMIDE 40 MG PO TABS
40.0000 mg | ORAL_TABLET | Freq: Every day | ORAL | Status: DC
  Administered 2012-08-30 – 2012-09-03 (×5): 40 mg via ORAL
  Filled 2012-08-30 (×6): qty 1

## 2012-08-30 MED ORDER — DIPHENHYDRAMINE HCL 12.5 MG/5ML PO ELIX: 12.5000 mg | ORAL_SOLUTION | Freq: Four times a day (QID) | ORAL | Status: DC | PRN

## 2012-08-30 MED ORDER — METFORMIN HCL 500 MG PO TABS
1000.0000 mg | ORAL_TABLET | Freq: Every day | ORAL | Status: DC
  Administered 2012-08-31 – 2012-09-04 (×5): 1000 mg via ORAL
  Filled 2012-08-30 (×6): qty 2

## 2012-08-30 MED ORDER — LIDOCAINE HCL (CARDIAC) 20 MG/ML IV SOLN
INTRAVENOUS | Status: DC | PRN
  Administered 2012-08-30: 80 mg via INTRAVENOUS

## 2012-08-30 MED ORDER — ADULT MULTIVITAMIN W/MINERALS CH
1.0000 | ORAL_TABLET | Freq: Every day | ORAL | Status: DC
  Administered 2012-08-30 – 2012-09-03 (×5): 1 via ORAL
  Filled 2012-08-30 (×6): qty 1

## 2012-08-30 MED ORDER — SIMVASTATIN 20 MG PO TABS
20.0000 mg | ORAL_TABLET | Freq: Every evening | ORAL | Status: DC
  Administered 2012-08-30 – 2012-09-03 (×5): 20 mg via ORAL
  Filled 2012-08-30 (×6): qty 1

## 2012-08-30 MED ORDER — CEFAZOLIN SODIUM 1-5 GM-% IV SOLN
INTRAVENOUS | Status: AC
  Filled 2012-08-30: qty 50

## 2012-08-30 MED ORDER — MUPIROCIN 2 % EX OINT
TOPICAL_OINTMENT | Freq: Two times a day (BID) | CUTANEOUS | Status: DC
  Administered 2012-08-30 – 2012-09-03 (×8): via NASAL
  Filled 2012-08-30 (×3): qty 22

## 2012-08-30 MED ORDER — ACETAMINOPHEN 325 MG PO TABS
650.0000 mg | ORAL_TABLET | ORAL | Status: DC | PRN
  Administered 2012-09-03 (×2): 650 mg via ORAL
  Filled 2012-08-30 (×2): qty 2

## 2012-08-30 MED ORDER — PHENOL 1.4 % MT LIQD: 1.0000 | OROMUCOSAL | Status: DC | PRN

## 2012-08-30 MED ORDER — ACETAMINOPHEN 650 MG RE SUPP: 650.0000 mg | RECTAL | Status: DC | PRN

## 2012-08-30 MED ORDER — PROPOFOL 10 MG/ML IV EMUL
INTRAVENOUS | Status: DC | PRN
  Administered 2012-08-30: 200 mg via INTRAVENOUS

## 2012-08-30 MED ORDER — DROPERIDOL 2.5 MG/ML IJ SOLN: 0.6250 mg | INTRAMUSCULAR | Status: DC | PRN

## 2012-08-30 MED ORDER — CEFAZOLIN SODIUM-DEXTROSE 2-3 GM-% IV SOLR
INTRAVENOUS | Status: AC
  Filled 2012-08-30: qty 50

## 2012-08-30 MED ORDER — LIDOCAINE-EPINEPHRINE 0.5 %-1:200000 IJ SOLN
INTRAMUSCULAR | Status: DC | PRN
  Administered 2012-08-30: 40 mL

## 2012-08-30 MED ORDER — ACETAMINOPHEN 10 MG/ML IV SOLN
INTRAVENOUS | Status: AC
  Filled 2012-08-30: qty 100

## 2012-08-30 MED ORDER — POTASSIUM CHLORIDE CRYS ER 20 MEQ PO TBCR
20.0000 meq | EXTENDED_RELEASE_TABLET | Freq: Two times a day (BID) | ORAL | Status: DC
  Administered 2012-08-30 – 2012-09-03 (×8): 20 meq via ORAL
  Filled 2012-08-30 (×11): qty 1

## 2012-08-30 MED ORDER — DIPHENHYDRAMINE HCL 50 MG/ML IJ SOLN: 12.5000 mg | Freq: Four times a day (QID) | INTRAMUSCULAR | Status: DC | PRN

## 2012-08-30 MED ORDER — ONDANSETRON HCL 4 MG/2ML IJ SOLN: 4.0000 mg | Freq: Four times a day (QID) | INTRAMUSCULAR | Status: DC | PRN

## 2012-08-30 MED ORDER — ONDANSETRON HCL 4 MG/2ML IJ SOLN
4.0000 mg | INTRAMUSCULAR | Status: DC | PRN
  Administered 2012-08-31 (×2): 4 mg via INTRAVENOUS
  Filled 2012-08-30 (×2): qty 2

## 2012-08-30 MED ORDER — OXYCODONE-ACETAMINOPHEN 5-325 MG PO TABS
1.0000 | ORAL_TABLET | ORAL | Status: DC | PRN
  Administered 2012-09-02 – 2012-09-03 (×7): 2 via ORAL
  Filled 2012-08-30 (×7): qty 2

## 2012-08-30 MED ORDER — LOSARTAN POTASSIUM 50 MG PO TABS: 100.0000 mg | ORAL_TABLET | Freq: Every day | ORAL | Status: DC

## 2012-08-30 MED ORDER — CEFAZOLIN SODIUM 1-5 GM-% IV SOLN
1.0000 g | Freq: Three times a day (TID) | INTRAVENOUS | Status: AC
  Administered 2012-08-31 (×2): 1 g via INTRAVENOUS
  Filled 2012-08-30 (×2): qty 50

## 2012-08-30 MED ORDER — HYDROMORPHONE HCL PF 1 MG/ML IJ SOLN
INTRAMUSCULAR | Status: AC
  Administered 2012-08-30: 0.5 mg via INTRAVENOUS
  Filled 2012-08-30: qty 1

## 2012-08-30 MED ORDER — HYDROMORPHONE 0.3 MG/ML IV SOLN
INTRAVENOUS | Status: DC
  Administered 2012-08-30: 23:00:00 via INTRAVENOUS
  Administered 2012-08-31: 2.1 mg via INTRAVENOUS
  Administered 2012-08-31: 10:00:00 via INTRAVENOUS
  Administered 2012-08-31: 0.9 mg via INTRAVENOUS
  Administered 2012-08-31: 0.4 mg via INTRAVENOUS
  Administered 2012-09-01: 2.02 mg via INTRAVENOUS
  Administered 2012-09-01: 0.3 mg via INTRAVENOUS
  Administered 2012-09-01: 0.9 mg via INTRAVENOUS
  Administered 2012-09-01: 0.5 mg via INTRAVENOUS
  Administered 2012-09-01: 13:00:00 via INTRAVENOUS
  Administered 2012-09-01: 1.42 mg via INTRAVENOUS
  Administered 2012-09-02: 1.5 mg via INTRAVENOUS
  Administered 2012-09-02: 0.9 mg via INTRAVENOUS
  Filled 2012-08-30 (×4): qty 25

## 2012-08-30 MED ORDER — MUPIROCIN 2 % EX OINT
TOPICAL_OINTMENT | CUTANEOUS | Status: AC
  Filled 2012-08-30: qty 22

## 2012-08-30 MED ORDER — HYDROCODONE-ACETAMINOPHEN 5-325 MG PO TABS: 1.0000 | ORAL_TABLET | ORAL | Status: DC | PRN

## 2012-08-30 MED ORDER — EPHEDRINE SULFATE 50 MG/ML IJ SOLN
INTRAMUSCULAR | Status: DC | PRN
  Administered 2012-08-30: 10 mg via INTRAVENOUS

## 2012-08-30 MED ORDER — AMLODIPINE BESYLATE 5 MG PO TABS
5.0000 mg | ORAL_TABLET | Freq: Every day | ORAL | Status: DC
  Administered 2012-08-30 – 2012-09-03 (×5): 5 mg via ORAL
  Filled 2012-08-30 (×6): qty 1

## 2012-08-30 MED ORDER — DEXTROSE 5 % IV SOLN
10.0000 mg | INTRAVENOUS | Status: DC | PRN
  Administered 2012-08-30: 10 ug/min via INTRAVENOUS

## 2012-08-30 MED ORDER — METFORMIN HCL ER 750 MG PO TB24
1500.0000 mg | ORAL_TABLET | Freq: Every evening | ORAL | Status: DC
  Administered 2012-08-30 – 2012-09-03 (×5): 1500 mg via ORAL
  Filled 2012-08-30 (×6): qty 2

## 2012-08-30 MED ORDER — PANTOPRAZOLE SODIUM 40 MG PO TBEC
80.0000 mg | DELAYED_RELEASE_TABLET | Freq: Every day | ORAL | Status: DC
  Administered 2012-08-31 – 2012-09-02 (×3): 80 mg via ORAL
  Administered 2012-09-03: 40 mg via ORAL
  Filled 2012-08-30 (×2): qty 2
  Filled 2012-08-30: qty 1

## 2012-08-30 MED ORDER — ONDANSETRON HCL 4 MG/2ML IJ SOLN
INTRAMUSCULAR | Status: DC | PRN
  Administered 2012-08-30: 4 mg via INTRAVENOUS

## 2012-08-30 MED ORDER — OXYCODONE HCL 5 MG PO TABS: 5.0000 mg | ORAL_TABLET | Freq: Once | ORAL | Status: DC | PRN

## 2012-08-30 MED ORDER — PHENYLEPHRINE HCL 10 MG/ML IJ SOLN
INTRAMUSCULAR | Status: DC | PRN
  Administered 2012-08-30: 80 ug via INTRAVENOUS

## 2012-08-30 MED ORDER — FENTANYL CITRATE 0.05 MG/ML IJ SOLN
INTRAMUSCULAR | Status: DC | PRN
  Administered 2012-08-30: 50 ug via INTRAVENOUS
  Administered 2012-08-30: 100 ug via INTRAVENOUS
  Administered 2012-08-30: 50 ug via INTRAVENOUS
  Administered 2012-08-30: 100 ug via INTRAVENOUS

## 2012-08-30 MED ORDER — SODIUM CHLORIDE 0.9 % IV SOLN
250.0000 mL | INTRAVENOUS | Status: DC
  Administered 2012-08-30: 250 mL via INTRAVENOUS

## 2012-08-30 MED ORDER — 0.9 % SODIUM CHLORIDE (POUR BTL) OPTIME
TOPICAL | Status: DC | PRN
  Administered 2012-08-30 (×3): 1000 mL

## 2012-08-30 MED ORDER — SODIUM CHLORIDE 0.9 % IV SOLN
INTRAVENOUS | Status: DC | PRN
  Administered 2012-08-30: 11:00:00 via INTRAVENOUS

## 2012-08-30 MED ORDER — SODIUM CHLORIDE 0.9 % IJ SOLN
3.0000 mL | Freq: Two times a day (BID) | INTRAMUSCULAR | Status: DC
  Administered 2012-09-02 – 2012-09-03 (×4): 3 mL via INTRAVENOUS

## 2012-08-30 MED ORDER — GLYCOPYRROLATE 0.2 MG/ML IJ SOLN
INTRAMUSCULAR | Status: DC | PRN
  Administered 2012-08-30: .8 mg via INTRAVENOUS

## 2012-08-30 MED ORDER — NALOXONE HCL 0.4 MG/ML IJ SOLN: 0.4000 mg | INTRAMUSCULAR | Status: DC | PRN

## 2012-08-30 SURGICAL SUPPLY — 84 items
ADH SKN CLS APL DERMABOND .7 (GAUZE/BANDAGES/DRESSINGS) ×1
ADH SKN CLS LQ APL DERMABOND (GAUZE/BANDAGES/DRESSINGS) ×1
APL SKNCLS STERI-STRIP NONHPOA (GAUZE/BANDAGES/DRESSINGS)
BAG DECANTER FOR FLEXI CONT (MISCELLANEOUS) ×2 IMPLANT
BENZOIN TINCTURE PRP APPL 2/3 (GAUZE/BANDAGES/DRESSINGS) IMPLANT
BLADE SURG ROTATE 9660 (MISCELLANEOUS) ×1 IMPLANT
BONE MATRIX OSTEOCEL PLUS 10CC (Bone Implant) ×1 IMPLANT
BUR MATCHSTICK NEURO 3.0 LAGG (BURR) ×2 IMPLANT
CAGE COROENT 13X9X23-4 (Cage) ×2 IMPLANT
CAGE COROENT 13X9X28-4 (Cage) ×2 IMPLANT
CANISTER SUCTION 2500CC (MISCELLANEOUS) ×2 IMPLANT
CLOTH BEACON ORANGE TIMEOUT ST (SAFETY) ×2 IMPLANT
CONT SPEC 4OZ CLIKSEAL STRL BL (MISCELLANEOUS) ×3 IMPLANT
COVER BACK TABLE 24X17X13 BIG (DRAPES) IMPLANT
DECANTER SPIKE VIAL GLASS SM (MISCELLANEOUS) ×2 IMPLANT
DERMABOND ADHESIVE PROPEN (GAUZE/BANDAGES/DRESSINGS) ×1
DERMABOND ADVANCED (GAUZE/BANDAGES/DRESSINGS) ×1
DERMABOND ADVANCED .7 DNX12 (GAUZE/BANDAGES/DRESSINGS) ×1 IMPLANT
DERMABOND ADVANCED .7 DNX6 (GAUZE/BANDAGES/DRESSINGS) IMPLANT
DRAPE C-ARM 42X72 X-RAY (DRAPES) ×4 IMPLANT
DRAPE C-ARMOR (DRAPES) ×1 IMPLANT
DRAPE LAPAROTOMY 100X72X124 (DRAPES) ×2 IMPLANT
DRAPE POUCH INSTRU U-SHP 10X18 (DRAPES) ×2 IMPLANT
DRAPE SURG 17X23 STRL (DRAPES) ×2 IMPLANT
DRESSING TELFA 8X3 (GAUZE/BANDAGES/DRESSINGS) IMPLANT
DURAPREP 26ML APPLICATOR (WOUND CARE) ×2 IMPLANT
ELECT BLADE 4.0 EZ CLEAN MEGAD (MISCELLANEOUS) ×2
ELECT CAUTERY BLADE 6.4 (BLADE) ×1 IMPLANT
ELECT REM PT RETURN 9FT ADLT (ELECTROSURGICAL) ×2
ELECTRODE BLDE 4.0 EZ CLN MEGD (MISCELLANEOUS) IMPLANT
ELECTRODE REM PT RTRN 9FT ADLT (ELECTROSURGICAL) ×1 IMPLANT
GAUZE SPONGE 4X4 16PLY XRAY LF (GAUZE/BANDAGES/DRESSINGS) ×2 IMPLANT
GLOVE BIO SURGEON STRL SZ7 (GLOVE) ×1 IMPLANT
GLOVE BIOGEL M 8.0 STRL (GLOVE) ×1 IMPLANT
GLOVE BIOGEL PI IND STRL 7.0 (GLOVE) IMPLANT
GLOVE BIOGEL PI IND STRL 7.5 (GLOVE) IMPLANT
GLOVE BIOGEL PI INDICATOR 7.0 (GLOVE) ×4
GLOVE BIOGEL PI INDICATOR 7.5 (GLOVE) ×1
GLOVE ECLIPSE 6.5 STRL STRAW (GLOVE) ×5 IMPLANT
GLOVE ECLIPSE 7.5 STRL STRAW (GLOVE) ×2 IMPLANT
GLOVE EXAM NITRILE LRG STRL (GLOVE) IMPLANT
GLOVE EXAM NITRILE MD LF STRL (GLOVE) IMPLANT
GLOVE EXAM NITRILE XL STR (GLOVE) IMPLANT
GLOVE EXAM NITRILE XS STR PU (GLOVE) IMPLANT
GLOVE SURG SS PI 6.5 STRL IVOR (GLOVE) ×7 IMPLANT
GOWN BRE IMP SLV AUR LG STRL (GOWN DISPOSABLE) ×13 IMPLANT
GOWN BRE IMP SLV AUR XL STRL (GOWN DISPOSABLE) ×3 IMPLANT
GOWN STRL REIN 2XL LVL4 (GOWN DISPOSABLE) IMPLANT
KIT BASIN OR (CUSTOM PROCEDURE TRAY) ×2 IMPLANT
KIT POSITION SURG JACKSON T1 (MISCELLANEOUS) ×2 IMPLANT
KIT ROOM TURNOVER OR (KITS) ×2 IMPLANT
LIGHT SOURCE ANGLE TIP STR 7FT (MISCELLANEOUS) ×1 IMPLANT
MILL MEDIUM DISP (BLADE) ×1 IMPLANT
NDL HYPO 21X1.5 SAFETY (NEEDLE) IMPLANT
NDL HYPO 25X1 1.5 SAFETY (NEEDLE) ×1 IMPLANT
NDL SPNL 18GX3.5 QUINCKE PK (NEEDLE) IMPLANT
NEEDLE HYPO 21X1.5 SAFETY (NEEDLE) ×2 IMPLANT
NEEDLE HYPO 25X1 1.5 SAFETY (NEEDLE) ×2 IMPLANT
NEEDLE SPNL 18GX3.5 QUINCKE PK (NEEDLE) ×2 IMPLANT
NS IRRIG 1000ML POUR BTL (IV SOLUTION) ×2 IMPLANT
PACK LAMINECTOMY NEURO (CUSTOM PROCEDURE TRAY) ×2 IMPLANT
PAD ARMBOARD 7.5X6 YLW CONV (MISCELLANEOUS) ×6 IMPLANT
PENCIL BUTTON HOLSTER BLD 10FT (ELECTRODE) ×1 IMPLANT
ROD 70MM (Rod) ×4 IMPLANT
ROD SPNL 70XPREBNT NS MAS (Rod) IMPLANT
SCREW LOCK (Screw) ×12 IMPLANT
SCREW LOCK FXNS SPNE MAS PL (Screw) IMPLANT
SCREW SHANK 5.0X35 (Screw) ×1 IMPLANT
SCREW SHANK 5.0X40MM (Screw) ×5 IMPLANT
SCREW TULIP 5.5 (Screw) ×6 IMPLANT
SPONGE GAUZE 4X4 12PLY (GAUZE/BANDAGES/DRESSINGS) IMPLANT
SPONGE LAP 4X18 X RAY DECT (DISPOSABLE) ×1 IMPLANT
SPONGE SURGIFOAM ABS GEL 100 (HEMOSTASIS) ×2 IMPLANT
STRIP CLOSURE SKIN 1/2X4 (GAUZE/BANDAGES/DRESSINGS) IMPLANT
SUT PROLENE 6 0 BV (SUTURE) IMPLANT
SUT VIC AB 0 CT1 18XCR BRD8 (SUTURE) ×1 IMPLANT
SUT VIC AB 0 CT1 8-18 (SUTURE) ×4
SUT VIC AB 2-0 CT1 18 (SUTURE) ×3 IMPLANT
SUT VIC AB 3-0 SH 8-18 (SUTURE) ×3 IMPLANT
SYR 20CC LL (SYRINGE) ×1 IMPLANT
SYR 20ML ECCENTRIC (SYRINGE) ×2 IMPLANT
TOWEL OR 17X24 6PK STRL BLUE (TOWEL DISPOSABLE) ×2 IMPLANT
TOWEL OR 17X26 10 PK STRL BLUE (TOWEL DISPOSABLE) ×2 IMPLANT
WATER STERILE IRR 1000ML POUR (IV SOLUTION) ×2 IMPLANT

## 2012-08-30 NOTE — Anesthesia Preprocedure Evaluation (Signed)
Anesthesia Evaluation  Patient identified by MRN, date of birth, ID band Patient awake    Reviewed: Allergy & Precautions, H&P , NPO status , Patient's Chart, lab work & pertinent test results  History of Anesthesia Complications Negative for: history of anesthetic complications  Airway Mallampati: I TM Distance: >3 FB Neck ROM: Full    Dental  (+) Teeth Intact and Dental Advisory Given   Pulmonary sleep apnea and Continuous Positive Airway Pressure Ventilation ,  breath sounds clear to auscultation  Pulmonary exam normal       Cardiovascular hypertension, Pt. on medications Rhythm:Regular Rate:Normal     Neuro/Psych    GI/Hepatic negative GI ROS, Neg liver ROS, hiatal hernia, GERD-  ,  Endo/Other  diabetes, Type 1, Insulin Dependent  Renal/GU negative Renal ROS     Musculoskeletal   Abdominal   Peds  Hematology   Anesthesia Other Findings   Reproductive/Obstetrics                           Anesthesia Physical Anesthesia Plan  ASA: III  Anesthesia Plan: General   Post-op Pain Management:    Induction: Intravenous  Airway Management Planned: Oral ETT  Additional Equipment:   Intra-op Plan:   Post-operative Plan: Extubation in OR  Informed Consent: I have reviewed the patients History and Physical, chart, labs and discussed the procedure including the risks, benefits and alternatives for the proposed anesthesia with the patient or authorized representative who has indicated his/her understanding and acceptance.   Dental advisory given  Plan Discussed with: CRNA, Anesthesiologist and Surgeon  Anesthesia Plan Comments:         Anesthesia Quick Evaluation

## 2012-08-30 NOTE — Progress Notes (Signed)
Patient ID: Kevin [name], male   DOB: 1946-12-17, 67 y.o.   MRN: [id number] BP 124/81  Pulse 97  Temp 97.4 F (36.3 C) (Oral)  Resp 24  Ht 6\' 1"  (1.854 m)  Wt 61.372 kg (135 lb 4.8 oz)  BMI 17.85 kg/m2  SpO2 95% Alert and oriented x4 Moving all extremities well Blister on chin will watch carefully Wound is clean, no signs of infection

## 2012-08-30 NOTE — Progress Notes (Signed)
Pt. With redness noted to forehead and chin areas, Dr. Malen Gauze aware

## 2012-08-30 NOTE — Op Note (Signed)
08/30/2012  5:09 PM  PATIENT:  Kevin [name]  66 y.o. 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  Nuvasive Peek interbody cages 2 13x54mm cages L4/5,  Nuvasive Peek 2 13x77mm cages L3/4 autograft  LumbAr decompression L3,4,and 5 roots beyond what was necessary for Plif at those levels Posterolateral arthrodesis L3-5 morselized autograft  Segmental pedicle screw fixation L3-5 Nuvasive  SURGEON:  Surgeon(s): Carmela Hurt, MD  ASSISTANTS:[place] ANESTHESIA:   general  EBL:  Total I/O In: 2570 [I.V.:2200; Blood:120; IV Piggyback:250] Out: 730 [Urine:330; Blood:400]  BLOOD ADMINISTERED:120 CC CELLSAVER  CELL SAVER GIVEN:120  COUNT:per nursing DRAINS: none   SPECIMEN:  No Specimen  DICTATION: Kevin [name] was taken to the operating room intubated and placed under a general anesthetic without difficulty. A foley catheter was placed under sterile conditions. He was positioned prone on a Jackson table with all pressure points padded. His back was prepped and draped in a sterile fashion. I infiltrated 50cc lidocaine into the lumbar region. I opened the skin with a 10 blade and took the incision down to the thoracolumbar fascia. I exposed the lamina of L2, 3, 4, 5 bilaterally. With fluoroscopic guidance I placed 5.58mm pedicle screws in a medial to lateral trajectory. I placed 2 screws at each level first by drilling a pilot hole, then by tapping. I then placed the screws after inspecting each hole with a probe. I did not appreciate cutouts in the pilot holes.  I then turned to my decompression. I performed a complete laminectomy of L4, and hemi laminectomies of L3 and L5. I performed facetectomies to decompress the lateral recesses of L3/4 and L4/5  decompressing the L3, L4 and L5 roots bilaterally. With the spinal canal and nerve roots decompressed I prepared the disc spaces for the plif. I performed discetomies bilaterally at L3/4, and 4/5. I sized the spaces and believed that 13mm cages filled with autograft from the laminectomies would work best. I used longer cages at L4/5 and shorter cages at L3/4.  I placed more bone and osteocell into the disc spaces at both levels, and also completed Posterolateral arthrodesis at both levels.  I connected the rods to the screw heads and secured them.  Dr. Jeral Fruit assisted with the arthrodesis and final screw construct. I irrigated the wound then closed in a layered fashion.  I approximated the thoraclumbar fascia, subcutaneous, and subcuticular layers with vicryl sutures. I used dermabond for a sterile dressing.  PLAN OF CARE: Admit to inpatient   PATIENT DISPOSITION:  PACU - hemodynamically stable.   Delay start of Pharmacological VTE agent (>24hrs) due to surgical blood loss or risk of bleeding:  yes

## 2012-08-30 NOTE — Transfer of Care (Signed)
Immediate Anesthesia Transfer of Care Note  Patient: Kevin [name]  Procedure(s) Performed: Procedure(s) (LRB) with comments: POSTERIOR LUMBAR FUSION 2 LEVEL (N/A) - Lumbar three four, lumbar four five posterior lumbar interbody fusion with interbody prothesis posterolateral arthrodesis and posterior segmental instrumentation.   Patient Location: PACU  Anesthesia Type: General  Level of Consciousness: awake and alert   Airway & Oxygen Therapy: Patient Spontanous Breathing and Patient connected to face mask oxygen  Post-op Assessment: Report given to PACU RN and Post -op Vital signs reviewed and stable  Post vital signs: Reviewed and stable  Complications: No apparent anesthesia complications

## 2012-08-30 NOTE — Anesthesia Procedure Notes (Signed)
Procedure Name: Intubation Date/Time: 08/30/2012 8:05 AM Performed by: Gwenyth Allegra Pre-anesthesia Checklist: Patient identified, Timeout performed, Emergency Drugs available, Suction available and Patient being monitored Patient Re-evaluated:Patient Re-evaluated prior to inductionOxygen Delivery Method: Circle system utilized Preoxygenation: Pre-oxygenation with 100% oxygen Intubation Type: IV induction Ventilation: Mask ventilation without difficulty and Oral airway inserted - appropriate to patient size Laryngoscope Size: Mac and 4 Grade View: Grade I Tube type: Oral Tube size: 8.5 mm Number of attempts: 1 Placement Confirmation: ETT inserted through vocal cords under direct vision,  breath sounds checked- equal and bilateral and positive ETCO2 Secured at: 23 cm Tube secured with: Tape Dental Injury: Teeth and Oropharynx as per pre-operative assessment

## 2012-08-30 NOTE — Anesthesia Postprocedure Evaluation (Signed)
  Anesthesia Post-op Note  Patient: Kevin [name]  Procedure(s) Performed: Procedure(s) (LRB) with comments: POSTERIOR LUMBAR FUSION 2 LEVEL (N/A) - Lumbar three four, lumbar four five posterior lumbar interbody fusion with interbody prothesis posterolateral arthrodesis and posterior segmental instrumentation.   Patient Location: PACU  Anesthesia Type: General  Level of Consciousness: awake, alert  and oriented  Airway and Oxygen Therapy: Patient Spontanous Breathing and Patient connected to nasal cannula oxygen  Post-op Pain: mild  Post-op Assessment: Post-op Vital signs reviewed, Patient's Cardiovascular Status Stable, Respiratory Function Stable, Patent Airway, No signs of Nausea or vomiting and Pain level controlled  Post-op Vital Signs: Reviewed and stable  Complications: soft tissue trauma: pressure sore on chin

## 2012-08-30 NOTE — Preoperative (Signed)
Beta Blockers   Reason not to administer Beta Blockers:Not Applicable 

## 2012-08-30 NOTE — H&P (Signed)
BP 123/74  Pulse 85  Temp 99.4 F (37.4 C) (Oral)  Resp 20  SpO2 97% Today I saw Henrik Wojtas in the office.  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  He comes today secondary to pain that he has been  having in his back.  He had undergone an evaluation previously and was told that he had spinal stenosis.  He was given exercises which he says he did initially, but then stopped.  The pain has subsided, but has currently returned.  He recently had taken a trip and the pain did get much worse.  He gets a shooting pain down both legs whenever he stands or walks.  He finds that it is easier to walk if he bends over and pushes a cart.  If he stands for any period of time, it will be painful.  He has had physical therapy.  He has tried epidural steroid injections.  He still has pain.  The pain has gotten worse recently and that is one of the reasons why Mr. Nannini just wanted to seek another opinion about what was going on.  He has numbness and tingling in the upper thigh which is intermittent.   PAST MEDICAL HISTORY:  He is 66 years of age, right-handed and retired.  He does have a history of diabetes.    FAMILY HISTORY:    Mother and father are both deceased.  Hypertension, myocardial infarct and cancer present in the family history.    PAST SURGICAL HISTORY:  He has had inguinal herniorrhaphies on two occasions performed by Dr. Lindie Spruce.    DRUG ALLERGIES:    No known drug allergies.    SOCIAL HISTORY:    He does not smoke.  He does drink alcohol socially, less than once a month.  He does not have a history of illicit drug use.  He is on examination 184 cm. in height and 302 lbs. and has a pulse of 87.    MEDICATIONS:    He currently is taking Diovan, Norvasc, Omeprazole, a multivitamin tablet, Acai, Resveratrol, Pycnogenol, Metformin, Novolog Insulin, Lasix, Potassium Chloride, Insulin Lantus, Zocor, Aspirin once a day and Vitamin D and he says Cialis and  tongue and cheek wrote whenever the mood strikes.     REVIEW OF SYSTEMS:   Positive for cataracts, leg pain with walking, standing, back pain, diabetes.  He denies constitutional, ear, nose, throat, mouth, respiratory, gastrointestinal, genitourinary, skin, neurological, psychiatric, hematologic or allergic problems.    EXAMINATION:    On examination he is alert, oriented x 4 and answering all questions appropriately.  Memory, language, attention span and fund of knowledge are normal.  Well kempt and in no distress.  5/5 strength in both upper and lower extremities.  Reflexes 2+ at the biceps, triceps, brachioradialis, knees and ankles.  Intact proprioception in the upper and lower extremities.  Toes are downgoing to plantar stimulation.  Gait is otherwise normal.  No cervical masses or bruits.  Lungs clear.  Heart regular rhythm and rate.  No murmurs or rubs.  Pulse is good at the wrists and feet bilaterally.  Romberg test is negative.  Pupils are equal, round and reactive to light.  Full extraocular movements.  Full visual fields.  Hearing intact to finger rub.  Uvula elevates in the midline.  Shoulder shrug is normal.  Tongue protrudes in the midline.    DIAGNOSTIC STUDIES:   MRI lumbar spine is reviewed.  The conus the cauda are normal.  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  No abnormalities in the paraspinous soft tissue.    SUMMARY: Mr. Givan returns today.  Mr. Crisanto states that he has just reached the point where he doesn't think he can keep going.  He has had the injections, three of them, none of which have provided any lasting relief.  In the office we went over his films again and he does have significant facet arthropathy at 3-4 and 4-5.  I also had him undergo plain films with flexion and extension views and he has a listhesis of 3 on 4 and of 4 on 5.  He obviously is not stable at those levels as seen on the flexion and  extension films.  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  When he stands and walks, he just has a terribly difficult time with the pain.    We spoke at length about possible arthrodesis, lumbar fusion.  His disc spaces on the MRI look very well maintained and I am not sure that he needs to have cages placed.  He really just needs to be decompressed and have screws placed to augment and facilitate him fusing posterolaterally.  The cages might very well help in terms of how many points of fixation and possible arthrodesis there are, but it also prolongs the operation and incurs a decent amount of manipulation of the thecal sac and nerve roots.  That decision about whether or not cages are to be placed certainly can be done intraoperatively, but at this point in time with him having undergone therapy and every other conventional modality that he is interested in, then the only thing left is surgery. Risks and benefits including bleeding, infection, no pain relief, fusion failure, hardware failure.

## 2012-08-31 ENCOUNTER — Inpatient Hospital Stay (HOSPITAL_COMMUNITY): Payer: Medicare Other

## 2012-08-31 LAB — GLUCOSE, CAPILLARY
Glucose-Capillary: 76 mg/dL (ref 70–99)
Glucose-Capillary: 81 mg/dL (ref 70–99)
Glucose-Capillary: 141 mg/dL — ABNORMAL HIGH (ref 70–99)

## 2012-08-31 MED ORDER — INSULIN ASPART 100 UNIT/ML ~~LOC~~ SOLN
0.0000 [IU] | SUBCUTANEOUS | Status: DC
  Administered 2012-08-31: 2 [IU] via SUBCUTANEOUS
  Administered 2012-09-01 (×2): 3 [IU] via SUBCUTANEOUS
  Administered 2012-09-01: 2 [IU] via SUBCUTANEOUS
  Administered 2012-09-01: 3 [IU] via SUBCUTANEOUS
  Administered 2012-09-01 – 2012-09-02 (×3): 2 [IU] via SUBCUTANEOUS
  Administered 2012-09-02: 18:00:00 via SUBCUTANEOUS

## 2012-08-31 MED ORDER — WHITE PETROLATUM GEL
Status: AC
  Filled 2012-08-31: qty 5

## 2012-08-31 NOTE — Progress Notes (Signed)
Pt sat up on side of bed this a.m. Pt felt dizzy and had another episode of nausea and vomiting.

## 2012-08-31 NOTE — Progress Notes (Signed)
Pt had episode of nausea and vomiting.  Pt vomited around 150 cc of clear emesis.  Zofran given to relieve pts nausea.

## 2012-08-31 NOTE — Evaluation (Signed)
Physical Therapy Evaluation Patient Details Name: Kevin [name] MRN: [id number] DOB: Oct 04, 1946 Today's Date: 08/31/2012 Time:  -     PT Assessment / Plan / Recommendation Clinical Impression  Pt s/p PLF L3-5. Pt required max encouragement, although with good functional mobility. Pt will benefit from skilled PT in the acute care setting in order to maximize functional mobility and return to PLOF for a safe return home.     PT Assessment  Patient needs continued PT services    Follow Up Recommendations  No PT follow up;Supervision for mobility/OOB    Barriers to Discharge        Equipment Recommendations  3 in 1 bedside comode;Rolling walker with 5" wheels (3-in-1 with elongated seat)    Recommendations for Other Services     Frequency Min 5X/week    Precautions / Restrictions Precautions Precautions: Back Precaution Booklet Issued: Yes (comment) Precaution Comments: pt educated on 3/3 back precautions Restrictions Weight Bearing Restrictions: No   Pertinent Vitals/Pain Pt with 7-8/10 pain throughout session. RN aware. PCA encouraged      Mobility  Bed Mobility Bed Mobility: Rolling Right;Right Sidelying to Sit;Sitting - Scoot to [place] of Bed Rolling Right: 4: Min assist;With rail Right Sidelying to Sit: With rails;2: Max assist Sitting - Scoot to [place] of Bed: 4: Min assist;With rail Details for Bed Mobility Assistance: Requires assist for log rolling and cues for back precautions Transfers Transfers: Sit to Stand;Stand to Sit Sit to Stand: From bed;With upper extremity assist;1: +2 Total assist Sit to Stand: Patient Percentage: 50% Stand to Sit: 4: Min assist;To chair/3-in-1;With upper extremity assist Details for Transfer Assistance: Requires increased time and cues for back precautions.  Difficulty achieving full standing position Ambulation/Gait Ambulation/Gait Assistance: 4: Min assist Ambulation Distance (Feet): 50 Feet Assistive device: Rolling  walker Ambulation/Gait Assistance Details: VC for proper sequencing with RW as well as safe distance for safety. Pt required increased cueing for anterior pelvic tilt Gait Pattern: Step-to pattern;Trunk flexed;Decreased stride length Gait velocity: decreased gait speed    Exercises     PT Diagnosis: Difficulty walking;Acute pain  PT Problem List: Decreased activity tolerance;Decreased mobility;Decreased knowledge of use of DME;Decreased safety awareness;Decreased knowledge of precautions;Pain PT Treatment Interventions: DME instruction;Gait training;Stair training;Functional mobility training;Therapeutic activities;Patient/family education   PT Goals Acute Rehab PT Goals PT Goal Formulation: With patient Time For Goal Achievement: 09/07/12 Potential to Achieve Goals: Good Pt will Roll Supine to Right Side: with modified independence PT Goal: Rolling Supine to Right Side - Progress: Goal set today Pt will Roll Supine to Left Side: with modified independence PT Goal: Rolling Supine to Left Side - Progress: Goal set today Pt will go Supine/Side to Sit: with modified independence PT Goal: Supine/Side to Sit - Progress: Goal set today Pt will go Sit to Supine/Side: with modified independence PT Goal: Sit to Supine/Side - Progress: Goal set today Pt will go Sit to Stand: with supervision PT Goal: Sit to Stand - Progress: Goal set today Pt will go Stand to Sit: with supervision PT Goal: Stand to Sit - Progress: Goal set today Pt will Transfer Bed to Chair/Chair to Bed: with supervision PT Transfer Goal: Bed to Chair/Chair to Bed - Progress: Goal set today Pt will Ambulate: >150 feet;with modified independence;with least restrictive assistive device PT Goal: Ambulate - Progress: Goal set today Pt will Go Up / Down Stairs: 3-5 stairs;with supervision;with rail(s) PT Goal: Up/Down Stairs - Progress: Goal set today  Visit Information  Last PT Received On: 08/31/12  Assistance Needed:  +1 PT/OT Co-Evaluation/Treatment: Yes    Subjective Data      Prior Functioning  Home Living Lives With: Significant other Available Help at Discharge: Family;Available 24 hours/day Type of Home: House Home Access: Stairs to enter [employer] of Steps: 3 Entrance Stairs-Rails: Left Home Layout: Two level;Able to live on main level with bedroom/bathroom Bathroom Shower/Tub: Health visitor: Standard Bathroom Accessibility: Yes How Accessible: Accessible via walker Home Adaptive Equipment: Straight cane Prior Function Level of Independence: Independent Able to Take Stairs?: Yes Driving: Yes Vocation: Volunteer work Musician: No difficulties Dominant Hand: Right    Cognition  Orientation Level: Appears intact for tasks assessed Behavior During Session: [facility] for tasks performed    Extremity/Trunk Assessment Right Upper Extremity Assessment RUE ROM/Strength/Tone: Within functional levels RUE Coordination: WFL - gross/fine motor Left Upper Extremity Assessment LUE ROM/Strength/Tone: Within functional levels LUE Coordination: WFL - gross/fine motor Right Lower Extremity Assessment RLE ROM/Strength/Tone: Within functional levels RLE Sensation: WFL - Light Touch Left Lower Extremity Assessment LLE ROM/Strength/Tone: Within functional levels LLE Sensation: WFL - Light Touch   Balance Balance Balance Assessed: Yes  End of Session PT - End of Session Equipment Utilized During Treatment: Gait belt Activity Tolerance: Patient tolerated treatment well Patient left: in chair;with call bell/phone within reach Nurse Communication: Mobility status  GP     Milana Kidney 08/31/2012, 4:49 PM  08/31/2012 Milana Kidney DPT PAGER: [phone] OFFICE: [phone]

## 2012-08-31 NOTE — Progress Notes (Signed)
Patient ID: Kevin [name], male   DOB: 1946-02-15, 66 y.o.   MRN: [id number] C/p incisional pain,noweakness. Pressure sore in chin. Vs wnl. Sensory normal. Spoke with him and wife. Pt/ot to see

## 2012-08-31 NOTE — Evaluation (Signed)
Occupational Therapy Evaluation Patient Details Name: Kevin [name] MRN: [id number] DOB: [birth year] Today's Date: 08/31/2012 Time: 4098-1191 OT Time Calculation (min): 38 min  OT Assessment / Plan / Recommendation Clinical Impression  This 67 y.o. male admitted for L3-5 lumbar fusion.  Pt. demonstrates the below listed deficits and will benefit from OT to maximize safety and independence with BADLs to allow pt. to return home with supervision.  Pt will likely require AE for home use.    OT Assessment  Patient needs continued OT Services    Follow Up Recommendations  No OT follow up;Supervision/Assistance - 24 hour;Supervision - Intermittent    Barriers to Discharge None    Equipment Recommendations  3 in 1 bedside comode;Rolling walker with 5" wheels (3-in-1 with elongated seat)    Recommendations for Other Services    Frequency  Min 2X/week    Precautions / Restrictions Precautions Precautions: Back Precaution Booklet Issued: Yes (comment) Precaution Comments: pt educated on 3/3 back precautions Restrictions Weight Bearing Restrictions: No       ADL  Grooming: Simulated;Wash/dry hands;Wash/dry face;Minimal assistance Where Assessed - Grooming: Supported standing Upper Body Bathing: Simulated;Minimal assistance Where Assessed - Upper Body Bathing: Unsupported sitting Lower Body Bathing: Simulated;Maximal assistance Where Assessed - Lower Body Bathing: Supported sit to stand Upper Body Dressing: Simulated;Minimal assistance Where Assessed - Upper Body Dressing: Unsupported sitting Lower Body Dressing: Simulated;+1 Total assistance Where Assessed - Lower Body Dressing: Supported sit to Pharmacist, hospital: Mining engineer Method: Sit to Barista: Comfort height toilet Toileting - Clothing Manipulation and Hygiene: Performed;Minimal assistance Where Assessed - Engineer, mining and Hygiene:  Standing Equipment Used: Rolling walker Transfers/Ambulation Related to ADLs: Pt. ambulates with min A and RW ADL Comments: Pt. instructed in back precautions    OT Diagnosis: Generalized weakness;Acute pain  OT Problem List:   OT Treatment Interventions: Self-care/ADL training;DME and/or AE instruction;Therapeutic activities;Patient/family education   OT Goals Acute Rehab OT Goals OT Goal Formulation: With patient Time For Goal Achievement: 09/07/12 Potential to Achieve Goals: Good ADL Goals Pt Will Perform Grooming: with supervision;Standing at sink ADL Goal: Grooming - Progress: Goal set today Pt Will Perform Lower Body Bathing: with supervision;Sit to stand from chair;Sit to stand from bed ADL Goal: Lower Body Bathing - Progress: Goal set today Pt Will Perform Lower Body Dressing: with supervision;with adaptive equipment;Sit to stand from chair;Sit to stand from bed ADL Goal: Lower Body Dressing - Progress: Goal set today Pt Will Transfer to Toilet: with supervision;3-in-1 ADL Goal: Toilet Transfer - Progress: Goal set today Pt Will Perform Toileting - Clothing Manipulation: with supervision;Standing ADL Goal: Toileting - Clothing Manipulation - Progress: Goal set today Pt Will Perform Toileting - Hygiene: with supervision;with adaptive equipment;Sit to stand from 3-in-1/toilet ADL Goal: Toileting - Hygiene - Progress: Goal set today Pt Will Perform Tub/Shower Transfer: with supervision;Ambulation;Shower transfer;with DME ADL Goal: Web designer - Progress: Goal set today  Visit Information  Last OT Received On: 08/31/12 Assistance Needed: +1 PT/OT Co-Evaluation/Treatment: Yes    Subjective Data  Subjective: "I'[name] stuck at a 7" re: pain Patient Stated Goal: To regain independence   Prior Functioning  Vision/Perception  Home Living Lives With: Significant other Available Help at Discharge: Family;Available 24 hours/day Type of Home: House Home Access: Stairs to  enter [employer] of Steps: 3 Entrance Stairs-Rails: Left Home Layout: Two level;Able to live on main level with bedroom/bathroom Bathroom Shower/Tub: Health visitor: Standard Bathroom Accessibility: Yes How Accessible: Accessible via  walker Home Adaptive Equipment: Straight cane Prior Function Level of Independence: Independent Able to Take Stairs?: Yes Driving: Yes Vocation: Volunteer work Musician: No difficulties Dominant Hand: Right      Cognition  Orientation Level: Appears intact for tasks assessed Behavior During Session: [facility] for tasks performed    Extremity/Trunk Assessment Right Upper Extremity Assessment RUE ROM/Strength/Tone: Within functional levels RUE Coordination: WFL - gross/fine motor Left Upper Extremity Assessment LUE ROM/Strength/Tone: Within functional levels LUE Coordination: WFL - gross/fine motor Right Lower Extremity Assessment RLE ROM/Strength/Tone: Within functional levels RLE Sensation: WFL - Light Touch Left Lower Extremity Assessment LLE ROM/Strength/Tone: Within functional levels LLE Sensation: WFL - Light Touch   Mobility  Shoulder Instructions  Bed Mobility Bed Mobility: Rolling Right;Right Sidelying to Sit;Sitting - Scoot to [place] of Bed Rolling Right: 4: Min assist;With rail Right Sidelying to Sit: With rails;2: Max assist Sitting - Scoot to [place] of Bed: 4: Min assist;With rail Details for Bed Mobility Assistance: Requires assist for log rolling and cues for back precautions Transfers Transfers: Sit to Stand;Stand to Sit Sit to Stand: From bed;With upper extremity assist;1: +2 Total assist Sit to Stand: Patient Percentage: 50% Stand to Sit: 4: Min assist;To chair/3-in-1;With upper extremity assist Details for Transfer Assistance: Requires increased time and cues for back precautions.  Difficulty achieving full standing position       Exercise     Balance Balance Balance Assessed:  Yes   End of Session OT - End of Session Activity Tolerance: Patient tolerated treatment well Patient left: in chair;with call bell/phone within reach  GO     Kevin [name] 08/31/2012, 2:20 PM

## 2012-08-31 NOTE — Progress Notes (Signed)
Patient one assist to ambulate back to bed.  Cues provided and walker utilized.  Patient encouraged to cough and take deep breaths.  Shallow breathing noted.  O2 Saturation 88-90% on 2L.  Coughing and incentive spirometry completed. Education provided to patient and family.  Will continue to monitor.  Osvaldo Angst, RN--------

## 2012-09-01 LAB — GLUCOSE, CAPILLARY
Glucose-Capillary: 129 mg/dL — ABNORMAL HIGH (ref 70–99)
Glucose-Capillary: 151 mg/dL — ABNORMAL HIGH (ref 70–99)
Glucose-Capillary: 153 mg/dL — ABNORMAL HIGH (ref 70–99)
Glucose-Capillary: 158 mg/dL — ABNORMAL HIGH (ref 70–99)

## 2012-09-01 MED ORDER — FLEET ENEMA 7-19 GM/118ML RE ENEM: 1.0000 | ENEMA | Freq: Every day | RECTAL | Status: DC | PRN

## 2012-09-01 NOTE — Progress Notes (Signed)
Patient ID: Kevin [name], male   DOB: [birth year], 66 y.o.   MRN: [id number] Awake,c/o incisional pain. Temp 99.6. Atelectasis by xrays. Poor physical activity.on morphine .respiratory therapy helping

## 2012-09-01 NOTE — Progress Notes (Signed)
CSW consulted for SNF placement. PT with no f/u PT recommendations noted. CSW signing off as no other CSW needs identified. Please re-consult if SNF needed.  Dellie Burns, MSW, LCSWA [phone] (Weekends 8:00am-4:30pm)

## 2012-09-01 NOTE — Progress Notes (Signed)
Physical Therapy Treatment Patient Details Name: Kevin [name] MRN: [id number] DOB: Jan 02, 1946 Today's Date: 09/01/2012 Time: 9147-8295 PT Time Calculation (min): 26 min  PT Assessment / Plan / Recommendation Comments on Treatment Session  Pt in 10/10 pain this session and not feeling well limiting functional mobility. Pt with sats hovering around 87-877% on RA, up to 90-91% with 2L. Educated pt on pursed lip breathing and the importance of the incentive spirometer. Discussed with pt the need for increased mobility for increased GI motility as well as increased lung capacity and for a safe d/c home    Follow Up Recommendations  No PT follow up;Supervision for mobility/OOB    Barriers to Discharge        Equipment Recommendations  3 in 1 bedside comode;Rolling walker with 5" wheels    Recommendations for Other Services    Frequency Min 5X/week   Plan Discharge plan remains appropriate;Frequency remains appropriate    Precautions / Restrictions Precautions Precautions: Back Precaution Booklet Issued: Yes (comment) Precaution Comments: pt able to verbalize 2/3 back precautions Restrictions Weight Bearing Restrictions: No   Pertinent Vitals/Pain 10/10 with all mobility. PCA encouraged. Pt desatting at all times, incentive spirometer encouraged as well as deep breathing techniques.     Mobility  Bed Mobility Bed Mobility: Not assessed Transfers Transfers: Sit to Stand;Stand to Sit Sit to Stand: From toilet;With upper extremity assist;3: Mod assist Stand to Sit: 3: Mod assist;With upper extremity assist;To chair/3-in-1 Details for Transfer Assistance: VC for hand placement as pt reaching for RW. Pt with increased difficulty standing from low surface.  Ambulation/Gait Ambulation/Gait Assistance: 4: Min guard Ambulation Distance (Feet): 20 Feet Assistive device: Rolling walker Ambulation/Gait Assistance Details: Pt with increased pain this ambulation limiting distance. Pt required  only minguard assist with cueing for safety, although unable to ambulate an increased distance. Gait Pattern: Step-to pattern;Trunk flexed;Decreased stride length Gait velocity: decreased gait speed    Exercises     PT Diagnosis:    PT Problem List:   PT Treatment Interventions:     PT Goals Acute Rehab PT Goals PT Goal: Sit to Stand - Progress: Progressing toward goal PT Goal: Stand to Sit - Progress: Progressing toward goal PT Transfer Goal: Bed to Chair/Chair to Bed - Progress: Progressing toward goal PT Goal: Ambulate - Progress: Progressing toward goal  Visit Information  Last PT Received On: 09/01/12 Assistance Needed: +1    Subjective Data      Cognition  Overall Cognitive Status: Appears within functional limits for tasks assessed/performed Arousal/Alertness: Awake/alert Orientation Level: Appears intact for tasks assessed Behavior During Session: [facility] for tasks performed    Balance     End of Session PT - End of Session Equipment Utilized During Treatment: Gait belt Activity Tolerance: Patient limited by pain Patient left: in chair;with call bell/phone within reach Nurse Communication: Mobility status   GP     Milana Kidney 09/01/2012, 5:43 PM

## 2012-09-02 LAB — GLUCOSE, CAPILLARY
Glucose-Capillary: 138 mg/dL — ABNORMAL HIGH (ref 70–99)
Glucose-Capillary: 119 mg/dL — ABNORMAL HIGH (ref 70–99)
Glucose-Capillary: 139 mg/dL — ABNORMAL HIGH (ref 70–99)
Glucose-Capillary: 150 mg/dL — ABNORMAL HIGH (ref 70–99)
Glucose-Capillary: 195 mg/dL — ABNORMAL HIGH (ref 70–99)

## 2012-09-02 MED ORDER — INSULIN ASPART 100 UNIT/ML ~~LOC~~ SOLN
0.0000 [IU] | Freq: Three times a day (TID) | SUBCUTANEOUS | Status: DC
  Administered 2012-09-03: 3 [IU] via SUBCUTANEOUS
  Administered 2012-09-03 (×2): 2 [IU] via SUBCUTANEOUS
  Administered 2012-09-04: 3 [IU] via SUBCUTANEOUS

## 2012-09-02 NOTE — Care Management Note (Signed)
    Page 1 of 1   09/04/2012     11:09:47 AM   CARE MANAGEMENT NOTE 09/04/2012  Patient:  Kevin [name],Kevin [name]   Account Number:  [id number]  Date Initiated:  09/02/2012  Documentation initiated by:  Onnie Boer  Subjective/Objective Assessment:   PT WAS ADMITTED FOR BACK SURGERY     Action/Plan:   PROGRESSION OF CARE AND DISCHARGE PLANNING   Anticipated DC Date:  09/04/2012   Anticipated DC Plan:  HOME W HOME HEALTH SERVICES      DC Planning Services  CM consult      Choice offered to / List presented to:             Status of service:  Completed, signed off Medicare Important Message given?   (If response is "NO", the following Medicare IM given date fields will be blank) Date Medicare IM given:   Date Additional Medicare IM given:    Discharge Disposition:  HOME/SELF CARE  Per UR Regulation:  Reviewed for med. necessity/level of care/duration of stay  If discussed at Long Length of Stay Meetings, dates discussed:    Comments:  09/04/12 Onnie Boer, RN, BSN 1108 PT AND FAMILY READY TO GO HOME.  SCRIPTS WERE GIVEN FOR A 3N1 AND RW.  PT HAS BEEN GIVEN THE CHOICE TO GET IT FROM [facility] AFTER DC.  09/02/12 Onnie Boer, RN, BSN 1605 PT WAS ADMITTED FOR A BACK SURGERY.   PTA PT WAS AT HOME. WILL F/U ON DC NEEDS AND RECOMMENDATIONS.

## 2012-09-02 NOTE — Progress Notes (Signed)
Physical Therapy Treatment Patient Details Name: Kevin [name] MRN: [id number] DOB: Mar 23, 1946 Today's Date: 09/02/2012 Time: 8295-6213 PT Time Calculation (min): 28 min  PT Assessment / Plan / Recommendation Comments on Treatment Session  Patient progressing with ambulation this session. At the end of sessions patients O2 was 85-90% on RA. RN was made aware. Patient on CPAP at night but does not normally require O2 at home. Continue to increase mobilization. Patient should be able to DC home with his wife as long as he continues to progress with mobiilty.     Follow Up Recommendations  No PT follow up;Supervision for mobility/OOB    Barriers to Discharge        Equipment Recommendations  Rolling walker with 5" wheels;3 in 1 bedside comode;Other (comment) (wide RW)    Recommendations for Other Services    Frequency Min 5X/week   Plan Discharge plan remains appropriate;Frequency remains appropriate    Precautions / Restrictions Precautions Precautions: Back Precaution Comments: Patient able to verbalize all precautions   Pertinent Vitals/Pain Patients O2 was 88% after ambulation. RN made aware.     Mobility  Bed Mobility Rolling Right: 4: Min guard;With rail Right Sidelying to Sit: 4: Min guard;With rails Sitting - Scoot to Edge of Bed: 4: Min guard Details for Bed Mobility Assistance: Patient able to complete without physical assistance but with heavy relianace on rails. Cues for log roll technique and to bring LEs off prior to sitting up Transfers Sit to Stand: 4: Min guard;With upper extremity assist;From elevated surface;From bed Stand to Sit: 4: Min guard;With upper extremity assist;To chair/3-in-1 Details for Transfer Assistance: Cues for safe hand placement for sit <>stand. Cues not to flex as far forward with standing Ambulation/Gait Ambulation/Gait Assistance: 4: Min guard Ambulation Distance (Feet): 80 Feet Assistive device: Rolling walker Ambulation/Gait  Assistance Details: Cues for posture. Patient with some complaints of L leg pain with ambulation but able to increase distance this morning Gait Pattern: Step-through pattern;Decreased stride length    Exercises     PT Diagnosis:    PT Problem List:   PT Treatment Interventions:     PT Goals Acute Rehab PT Goals PT Goal: Rolling Supine to Right Side - Progress: Progressing toward goal PT Goal: Supine/Side to Sit - Progress: Progressing toward goal PT Goal: Sit to Supine/Side - Progress: Progressing toward goal PT Goal: Sit to Stand - Progress: Progressing toward goal PT Transfer Goal: Bed to Chair/Chair to Bed - Progress: Progressing toward goal PT Goal: Ambulate - Progress: Progressing toward goal  Visit Information  Last PT Received On: 09/02/12 Assistance Needed: +1    Subjective Data      Cognition  Overall Cognitive Status: Appears within functional limits for tasks assessed/performed Arousal/Alertness: Awake/alert Orientation Level: Appears intact for tasks assessed Behavior During Session: [facility] for tasks performed    Balance     End of Session PT - End of Session Activity Tolerance: Patient tolerated treatment well Patient left: in chair;with call bell/phone within reach;Other (comment) (with OT) Nurse Communication: Mobility status   GP     Fredrich Birks 09/02/2012, 8:48 AM  09/02/2012 Fredrich Birks PTA [phone] pager [phone] office

## 2012-09-02 NOTE — Progress Notes (Signed)
Patient ID: Kevin [name], male   DOB: 16-Sep-1946, 66 y.o.   MRN: [id number] BP 133/69  Pulse 90  Temp 98.4 F (36.9 C) (Oral)  Resp 18  Ht 6\' 1"  (1.854 m)  Wt 61.372 kg (135 lb 4.8 oz)  BMI 17.85 kg/m2  SpO2 91% Alert and oriented x 4 Speech clear And fluent Moving all extremities well Wound is clean and dry.  Doing well

## 2012-09-02 NOTE — Progress Notes (Signed)
Occupational Therapy Treatment Patient Details Name: Kevin [name] MRN: [id number] DOB: 18-May-1946 Today's Date: 09/02/2012 Time: 7829-5621 OT Time Calculation (min): 51 min  OT Assessment / Plan / Recommendation Comments on Treatment Session Pt overall still with significant pain in his back resulting in increased assistance for LB selfcare.  Also noted decreased OT sats on room air at 86% after returning to bedside chair.  At end of session sats were at 90 % on room air.  Continue OT on acute stay.  Will need 3:1 for home.    Follow Up Recommendations  No OT follow up;Supervision/Assistance - 24 hour       Equipment Recommendations  3 in 1 bedside comode       Frequency Min 2X/week   Plan Discharge plan remains appropriate    Precautions / Restrictions Precautions Precautions: Back Precaution Comments: Patient able to verbalize all precautions Restrictions Weight Bearing Restrictions: No   Pertinent Vitals/Pain O2 sats 86% to 90% on room air, pain 8/10, repositioned and meds given earlier prior to session    ADL  Grooming: Performed;Supervision/safety Where Assessed - Grooming: Supported sitting Upper Body Bathing: Performed;Chest;Right arm;Left arm;Abdomen;Set up Where Assessed - Upper Body Bathing: Supported sitting Lower Body Bathing: Performed;Moderate assistance Where Assessed - Lower Body Bathing: Supported sit to stand Upper Body Dressing: Performed;Set up Where Assessed - Upper Body Dressing: Supported sitting Lower Body Dressing: Performed;Supervision/safety;Other (comment) (Use of sockaide and reacher to donn and doff socks.) Where Assessed - Lower Body Dressing: Supported sit to stand Equipment Used: Rolling walker;Sock aid;Reacher Transfers/Ambulation Related to ADLs: Pt min assist for mobility and transfer using the Rw. ADL Comments: Educated pt on use of AE for bathing and dressing tasks.  Pt also exhibits decreased ability to perform peri washing or cleaning  during toileting.  Discussed use of toilet aide vs tongs to help with thoroughness.  Pt reports that his wife will likely help.  Also discussed use of 3:1 in the walk-in shower for safety.      OT Goals Acute Rehab OT Goals Potential to Achieve Goals: Good ADL Goals ADL Goal: Grooming - Progress: Progressing toward goals ADL Goal: Lower Body Bathing - Progress: Progressing toward goals ADL Goal: Lower Body Dressing - Progress: Progressing toward goals  Visit Information  Last OT Received On: 09/02/12 Assistance Needed: +1    Subjective Data  Subjective: "If I see someone needing back surgery I'm going to tell them not to do it." Patient Stated Goal: To get rid of his pain and increase his independence.      Cognition  Overall Cognitive Status: Appears within functional limits for tasks assessed/performed Arousal/Alertness: Awake/alert Orientation Level: Appears intact for tasks assessed Behavior During Session: [facility] for tasks performed    Mobility  Shoulder Instructions Bed Mobility Rolling Right: 4: Min guard;With rail Right Sidelying to Sit: 4: Min guard;With rails Sitting - Scoot to Edge of Bed: 4: Min guard Details for Bed Mobility Assistance: Patient able to complete without physical assistance but with heavy relianace on rails. Cues for log roll technique and to bring LEs off prior to sitting up Transfers Sit to Stand: 4: Min guard;With upper extremity assist;From elevated surface;From bed Stand to Sit: 4: Min guard;With upper extremity assist;To chair/3-in-1 Details for Transfer Assistance: Cues for safe hand placement for sit <>stand. Cues not to flex as far forward with standing          Balance Balance Balance Assessed: No   End of Session OT - End of  Session Activity Tolerance: Patient limited by pain Patient left: in chair;with call bell/phone within reach Nurse Communication: Mobility status    Giorgi Debruin OTR/L 09/02/2012, 9:36 AM

## 2012-09-03 LAB — GLUCOSE, CAPILLARY
Glucose-Capillary: 166 mg/dL — ABNORMAL HIGH (ref 70–99)
Glucose-Capillary: 145 mg/dL — ABNORMAL HIGH (ref 70–99)
Glucose-Capillary: 149 mg/dL — ABNORMAL HIGH (ref 70–99)
Glucose-Capillary: 177 mg/dL — ABNORMAL HIGH (ref 70–99)

## 2012-09-03 MED ORDER — OXYCODONE-ACETAMINOPHEN 5-325 MG PO TABS: 1.0000 | ORAL_TABLET | Freq: Four times a day (QID) | ORAL | Status: AC | PRN

## 2012-09-03 MED ORDER — CYCLOBENZAPRINE HCL 10 MG PO TABS: 10.0000 mg | ORAL_TABLET | Freq: Three times a day (TID) | ORAL | Status: AC | PRN

## 2012-09-03 MED FILL — Sodium Chloride IV Soln 0.9%: INTRAVENOUS | Qty: 1000 | Status: AC

## 2012-09-03 MED FILL — Sodium Chloride Irrigation Soln 0.9%: Qty: 3000 | Status: AC

## 2012-09-03 NOTE — Progress Notes (Signed)
Physical Therapy Treatment Patient Details Name: Kevin [name] MRN: [id number] DOB: 30-Oct-1946 Today's Date: 09/03/2012 Time: 4098-1191 PT Time Calculation (min): 23 min  PT Assessment / Plan / Recommendation Comments on Treatment Session  Patient progressing with ambulation this session. Will need to attempt bed mobility without rails next session    Follow Up Recommendations  No PT follow up;Supervision for mobility/OOB    Barriers to Discharge        Equipment Recommendations  3 in 1 bedside comode    Recommendations for Other Services    Frequency Min 5X/week   Plan Discharge plan remains appropriate;Frequency remains appropriate    Precautions / Restrictions Precautions Precautions: Shoulder;Back Precaution Comments: Patient able to recall precautins.    Pertinent Vitals/Pain     Mobility  Bed Mobility Rolling Right: 5: Supervision;With rail Right Sidelying to Sit: 5: Supervision;With rails Sitting - Scoot to Edge of Bed: 5: Supervision Details for Bed Mobility Assistance: Will attempt without railings next session Transfers Sit to Stand: 4: Min guard;With upper extremity assist;From bed Stand to Sit: With upper extremity assist;To chair/3-in-1;With armrests Details for Transfer Assistance: Cues for safe hand placement Ambulation/Gait Ambulation/Gait Assistance: 4: Min guard Ambulation Distance (Feet): 220 Feet Assistive device: Rolling walker Ambulation/Gait Assistance Details: Cues to stand tall and with upright posture Gait Pattern: Step-through pattern;Decreased stride length;Trunk flexed    Exercises     PT Diagnosis:    PT Problem List:   PT Treatment Interventions:     PT Goals Acute Rehab PT Goals PT Goal: Rolling Supine to Right Side - Progress: Progressing toward goal PT Goal: Supine/Side to Sit - Progress: Progressing toward goal PT Goal: Sit to Stand - Progress: Progressing toward goal PT Goal: Stand to Sit - Progress: Progressing toward  goal PT Transfer Goal: Bed to Chair/Chair to Bed - Progress: Progressing toward goal PT Goal: Ambulate - Progress: Progressing toward goal  Visit Information  Last PT Received On: 09/03/12 Assistance Needed: +1    Subjective Data      Cognition  Overall Cognitive Status: Appears within functional limits for tasks assessed/performed Arousal/Alertness: Awake/alert Orientation Level: Appears intact for tasks assessed Behavior During Session: [facility] for tasks performed    Balance     End of Session PT - End of Session Equipment Utilized During Treatment: Gait belt Activity Tolerance: Patient tolerated treatment well Patient left: in chair;with call bell/phone within reach Nurse Communication: Mobility status   GP     Fredrich Birks 09/03/2012, 1:47 PM 09/03/2012 Fredrich Birks PTA [phone] pager [phone] office

## 2012-09-03 NOTE — Progress Notes (Signed)
Patient ID: Kevin [name], male   DOB: 1946/12/08, 66 y.o.   MRN: [id number] BP 139/69  Pulse 92  Temp 100.9 F (38.3 C) (Oral)  Resp 20  Ht 6\' 1"  (1.854 m)  Wt 61.372 kg (135 lb 4.8 oz)  BMI 17.85 kg/m2  SpO2 91% Alert and oriented x 4 Speech clear and fluent Moving all extremities well Wound is clean and dry. D/c tomorrow if cleared by pt

## 2012-09-03 NOTE — Progress Notes (Signed)
Occupational Therapy Treatment Patient Details Name: Kevin [name] MRN: [id number] DOB: May 19, 1946 Today's Date: 09/03/2012 Time: 4098-1191 OT Time Calculation (min): 29 min  OT Assessment / Plan / Recommendation Comments on Treatment Session Pt. improving with ability to perform LB ADLs.  Continues to need min cues for back precautions.      Follow Up Recommendations  No OT follow up;Supervision/Assistance - 24 hour    Barriers to Discharge       Equipment Recommendations  3 in 1 bedside comode (with elongated seat)    Recommendations for Other Services    Frequency Min 2X/week   Plan Discharge plan remains appropriate    Precautions / Restrictions Precautions Precautions: Back Precaution Booklet Issued: Yes (comment) Precaution Comments: Patient able to recall precautins.    Pertinent Vitals/Pain     ADL  Grooming: Performed;Wash/dry hands;Min guard Where Assessed - Grooming: Supported standing Lower Body Bathing: Simulated;Min guard Where Assessed - Lower Body Bathing: Supported sit to stand Lower Body Dressing: Performed;Supervision/safety;Min guard Where Assessed - Lower Body Dressing: Supported sit to Pharmacist, hospital: Performed;Min Pension scheme manager Method: Sit to Barista: Comfort height toilet Toileting - Clothing Manipulation and Hygiene: Performed;Min guard Where Assessed - Engineer, mining and Hygiene: Standing Equipment Used: Rolling walker Transfers/Ambulation Related to ADLs: Pt. ambulates with min guard assist ADL Comments: Pt. requires min cues for back precautions.  Pt. able to return demonstration re: use of AE; however, pt. able to perform LB ADLs by crossing ankles over knees. Instructed pt. to place 3-in-1 in shower as a shower seat and he verbalized understanding.   (Discussed toileting aid again.  He reports wife will assist)    OT Diagnosis:    OT Problem List:   OT Treatment Interventions:      OT Goals ADL Goals ADL Goal: Grooming - Progress: Progressing toward goals ADL Goal: Lower Body Bathing - Progress: Progressing toward goals ADL Goal: Lower Body Dressing - Progress: Progressing toward goals ADL Goal: Toilet Transfer - Progress: Progressing toward goals ADL Goal: Toileting - Clothing Manipulation - Progress: Progressing toward goals ADL Goal: Toileting - Hygiene - Progress: Progressing toward goals  Visit Information  Last OT Received On: 09/03/12 Assistance Needed: +1    Subjective Data      Prior Functioning       Cognition  Overall Cognitive Status: Appears within functional limits for tasks assessed/performed Arousal/Alertness: Awake/alert Orientation Level: Appears intact for tasks assessed Behavior During Session: Kevin [name] for tasks performed    Mobility  Shoulder Instructions Bed Mobility Bed Mobility: Rolling Right;Right Sidelying to Sit;Sitting - Scoot to [place] of Bed;Sit to Sidelying Right;Scooting to [name] Rolling Right: 5: Supervision Right Sidelying to Sit: 5: Supervision;HOB flat Sitting - Scoot to Edge of Bed: 5: Supervision Sit to Sidelying Right: 5: Supervision;HOB flat Scooting to HOB: 5: Supervision Details for Bed Mobility Assistance: Pt. performs bed mobility with supervision without bed rail.  Requires cues not to twist Transfers Transfers: Sit to Stand;Stand to Sit Sit to Stand: 4: Min guard;With upper extremity assist;From bed Stand to Sit: 4: Min guard;To bed;With upper extremity assist Details for Transfer Assistance: Cues to avoid bending when moving sit to stand       Exercises      Balance     End of Session OT - End of Session Activity Tolerance: Patient tolerated treatment well Patient left: in bed;with call bell/phone within reach  GO     Keli Buehner M 09/03/2012, 2:27 PM

## 2012-09-03 NOTE — Progress Notes (Signed)
NT informed RN that patient had a temp of 100.9 at 1900. Patient with tylenol and incentive spirometry on board. Called MD Kritzer on call to see if he wanted any additional orders for patient's temperature. No new orders at this time.  Minor, Yvette Rack

## 2012-09-04 LAB — GLUCOSE, CAPILLARY: Glucose-Capillary: 177 mg/dL — ABNORMAL HIGH (ref 70–99)

## 2012-09-04 NOTE — Progress Notes (Signed)
Patient departed hospital in good spirits.

## 2012-09-04 NOTE — Progress Notes (Signed)
Physical Therapy Treatment Patient Details Name: Kevin [name] MRN: [id number] DOB: 1946/11/05 Today's Date: 09/04/2012 Time: 7846-9629 PT Time Calculation (min): 16 min  PT Assessment / Plan / Recommendation Comments on Treatment Session  Patient progressing well, plans for DC home today.     Follow Up Recommendations  No PT follow up;Supervision for mobility/OOB    Barriers to Discharge        Equipment Recommendations  3 in 1 bedside comode    Recommendations for Other Services    Frequency Min 5X/week   Plan Discharge plan remains appropriate;Frequency remains appropriate    Precautions / Restrictions Precautions Precautions: Back Precaution Comments: Patient able to recall all precautions   Pertinent Vitals/Pain     Mobility  Bed Mobility Bed Mobility: Not assessed Transfers Sit to Stand: 5: Supervision;With upper extremity assist;From chair/3-in-1 Stand to Sit: 5: Supervision;With upper extremity assist;To chair/3-in-1 Ambulation/Gait Ambulation/Gait Assistance: 5: Supervision Ambulation Distance (Feet): 250 Feet Assistive device: Rolling walker Gait Pattern: Step-through pattern Stairs: Yes Stairs Assistance: 4: Min guard Stair Management Technique: One rail Left;Sideways;Step to pattern Number of Stairs: 3     Exercises     PT Diagnosis:    PT Problem List:   PT Treatment Interventions:     PT Goals Acute Rehab PT Goals PT Goal: Sit to Stand - Progress: Met PT Goal: Stand to Sit - Progress: Met PT Goal: Ambulate - Progress: Progressing toward goal PT Goal: Up/Down Stairs - Progress: Progressing toward goal  Visit Information  Last PT Received On: 09/04/12 Assistance Needed: +1    Subjective Data      Cognition  Overall Cognitive Status: Appears within functional limits for tasks assessed/performed Arousal/Alertness: Awake/alert Orientation Level: Appears intact for tasks assessed Behavior During Session: [facility] for tasks performed      Balance     End of Session PT - End of Session Equipment Utilized During Treatment: Gait belt Activity Tolerance: Patient tolerated treatment well Patient left: in chair;with call bell/phone within reach Nurse Communication: Mobility status   GP     Fredrich Birks 09/04/2012, 9:54 AM  09/04/2012 Fredrich Birks PTA [phone] pager [phone] office

## 2012-09-04 NOTE — Progress Notes (Signed)
Occupational Therapy Treatment Patient Details Name: OVIE CORNELIO MRN: [id number] DOB: 04-04-1946 Today's Date: 09/04/2012 Time: 0865-7846 OT Time Calculation (min): 14 min  OT Assessment / Plan / Recommendation Comments on Treatment Session Pt ready to discharge.  He has no further questions.  Back precautions reviewed.    Follow Up Recommendations  No OT follow up;Supervision/Assistance - 24 hour    Barriers to Discharge       Equipment Recommendations  3 in 1 bedside comode    Recommendations for Other Services    Frequency     Plan Discharge plan remains appropriate    Precautions / Restrictions Precautions Precautions: Back Precaution Booklet Issued: Yes (comment) Precaution Comments: Patient able to recall all precautions   Pertinent Vitals/Pain     ADL  ADL Comments: Pt. ready for discharge with wife present.  Reviewed proper technique for LB ADLs.  Pt. sitting slumped down in recliner.  Re-inforced back precautions and no arching.  Pt. repositioned self.  Pt. instructed on acquisition of DME.  Pt. with no further questions    OT Diagnosis:    OT Problem List:   OT Treatment Interventions:     OT Goals    Visit Information  Last OT Received On: 09/04/12 Assistance Needed: +1    Subjective Data      Prior Functioning       Cognition  Overall Cognitive Status: Appears within functional limits for tasks assessed/performed Arousal/Alertness: Awake/alert Orientation Level: Appears intact for tasks assessed Behavior During Session: [facility] for tasks performed    Mobility  Shoulder Instructions Bed Mobility Bed Mobility: Not assessed Transfers Sit to Stand: 5: Supervision;With upper extremity assist;From chair/3-in-1 Stand to Sit: 5: Supervision;With upper extremity assist;To chair/3-in-1       Exercises      Balance     End of Session    GO     Jeani Hawking M 09/04/2012, 10:27 AM

## 2012-09-04 NOTE — Discharge Summary (Signed)
Physician Discharge Summary  Patient ID: Kevin [name] MRN: [id number] DOB/AGE: 1946/04/23 66 y.o.  Admit date: 08/30/2012 Discharge date: 09/04/2012  Admission Diagnoses:spondylolisthesis L3/4,4/5 Lumbar stenosis Diabetes mellitus  Discharge Diagnoses:  Principal Problem:  *Spondylolisthesis of lumbar region lumbar stenosis Diabetes mellitus  Discharged Condition: good  Hospital Course: Mr. Kevin [name] was admitted and taken to the operating room for an L3-5 lumbar decompression and fusion. Post op he has done well. At discharge he is ambulating and voiding. He is tolerating a regular diet without difficulty. Wound is clean dry and without signs of infection   Consults: None  Significant Diagnostic Studies: none  Treatments: surgery: Posterior lumbar interbody arthrodesis L3/4, 4/5.  Nuvasive Peek interbody cages 2 13x46mm cages L4/5, Nuvasive Peek 2 13x27mm cages L3/4 autograft  LumbAr decompression L3,4,and 5 roots beyond what was necessary for Plif at those levels  Posterolateral arthrodesis L3-5 morselized autograft   Discharge Exam: Blood pressure 157/73, pulse 81, temperature 98.3 F (36.8 C), temperature source Oral, resp. rate 19, height 6\' 1"  (1.854 m), weight 61.372 kg (135 lb 4.8 oz), SpO2 94.00%. General appearance: alert, cooperative, appears stated age and morbidly obese Neurologic: Alert and oriented X 3, normal strength and tone. Normal symmetric reflexes. Normal coordination and gait  Disposition:   Discharge Orders    Future Orders Please Complete By Expires   Walker rolling      For home use only DME Bedside commode          Medication List     As of 09/04/2012  8:32 AM    TAKE these medications         amLODipine 10 MG tablet   Commonly known as: NORVASC   Take 5 mg by mouth daily.      aspirin 325 MG tablet   Take 325 mg by mouth daily.      cyclobenzaprine 10 MG tablet   Commonly known as: FLEXERIL   Take 1 tablet (10 mg total) by mouth 3  (three) times daily as needed for muscle spasms.      furosemide 40 MG tablet   Commonly known as: LASIX   Take 40 mg by mouth daily.      insulin aspart 100 UNIT/ML injection   Commonly known as: novoLOG   Inject 40 Units into the skin 3 (three) times daily before meals.      insulin glargine 100 UNIT/ML injection   Commonly known as: LANTUS   Inject 100 Units into the skin at bedtime.      losartan 100 MG tablet   Commonly known as: COZAAR   Take 100 mg by mouth daily.      metFORMIN 500 MG tablet   Commonly known as: GLUCOPHAGE   Take 1,000 mg by mouth daily with breakfast. 1000mg  in the morning; 1500mg  in the evening      metFORMIN 1000 MG (MOD) 24 hr tablet   Commonly known as: GLUMETZA   Take 1,500 mg by mouth every evening.      multivitamin with minerals Tabs   Take 1 tablet by mouth daily.      omeprazole 40 MG capsule   Commonly known as: PRILOSEC   Take 40 mg by mouth 2 (two) times daily.      oxyCODONE-acetaminophen 5-325 MG per tablet   Commonly known as: PERCOCET/ROXICET   Take 1-2 tablets by mouth every 6 (six) hours as needed for pain.      potassium chloride SA 20 MEQ tablet  Commonly known as: K-DUR,KLOR-CON   Take 20 mEq by mouth 2 (two) times daily.      simvastatin 20 MG tablet   Commonly known as: ZOCOR   Take 20 mg by mouth every evening.      valsartan 160 MG tablet   Commonly known as: DIOVAN   Take 160 mg by mouth daily.           Follow-up Information    Follow up with Kevin Madara L, MD in 3 weeks. (call to make appt)    Contact information:   [address] [place] [address] [phone]          Signed: Carmela [name] 09/04/2012, 8:32 AM

## 2012-11-05 ENCOUNTER — Ambulatory Visit: Payer: Medicare Other | Attending: Neurosurgery | Admitting: Rehabilitative and Restorative Service Providers"

## 2012-11-05 DIAGNOSIS — IMO0001 Reserved for inherently not codable concepts without codable children: Secondary | ICD-10-CM | POA: Insufficient documentation

## 2012-11-05 DIAGNOSIS — M545 Low back pain, unspecified: Secondary | ICD-10-CM | POA: Insufficient documentation

## 2012-11-05 DIAGNOSIS — R269 Unspecified abnormalities of gait and mobility: Secondary | ICD-10-CM | POA: Insufficient documentation

## 2012-11-12 ENCOUNTER — Ambulatory Visit: Payer: Medicare Other | Admitting: Rehabilitative and Restorative Service Providers"

## 2012-11-19 ENCOUNTER — Encounter: Payer: Medicare Other | Admitting: Rehabilitative and Restorative Service Providers"

## 2012-11-26 ENCOUNTER — Ambulatory Visit: Payer: Medicare Other | Attending: Neurosurgery | Admitting: Rehabilitative and Restorative Service Providers"

## 2012-11-26 DIAGNOSIS — IMO0001 Reserved for inherently not codable concepts without codable children: Secondary | ICD-10-CM | POA: Insufficient documentation

## 2012-11-26 DIAGNOSIS — M545 Low back pain, unspecified: Secondary | ICD-10-CM | POA: Insufficient documentation

## 2012-11-26 DIAGNOSIS — R269 Unspecified abnormalities of gait and mobility: Secondary | ICD-10-CM | POA: Insufficient documentation

## 2012-12-03 ENCOUNTER — Ambulatory Visit: Payer: Medicare Other | Admitting: Rehabilitative and Restorative Service Providers"

## 2012-12-10 ENCOUNTER — Encounter: Payer: Medicare Other | Admitting: Rehabilitative and Restorative Service Providers"

## 2012-12-28 ENCOUNTER — Emergency Department (HOSPITAL_COMMUNITY): Payer: Medicare Other

## 2012-12-28 ENCOUNTER — Inpatient Hospital Stay (HOSPITAL_COMMUNITY)
Admission: EM | Admit: 2012-12-28 | Discharge: 2013-01-07 | DRG: 981 | Disposition: A | Discharge status: Home Health | Payer: Medicare Other | Attending: Internal Medicine | Admitting: Internal Medicine

## 2012-12-28 ENCOUNTER — Encounter (HOSPITAL_COMMUNITY): Payer: Self-pay | Admitting: *Deleted

## 2012-12-28 DIAGNOSIS — R4182 Altered mental status, unspecified: Secondary | ICD-10-CM

## 2012-12-28 DIAGNOSIS — E669 Obesity, unspecified: Secondary | ICD-10-CM | POA: Diagnosis present

## 2012-12-28 DIAGNOSIS — I739 Peripheral vascular disease, unspecified: Secondary | ICD-10-CM | POA: Diagnosis present

## 2012-12-28 DIAGNOSIS — G9340 Encephalopathy, unspecified: Secondary | ICD-10-CM

## 2012-12-28 DIAGNOSIS — E119 Type 2 diabetes mellitus without complications: Secondary | ICD-10-CM | POA: Diagnosis present

## 2012-12-28 DIAGNOSIS — D509 Iron deficiency anemia, unspecified: Secondary | ICD-10-CM | POA: Diagnosis present

## 2012-12-28 DIAGNOSIS — Z794 Long term (current) use of insulin: Secondary | ICD-10-CM

## 2012-12-28 DIAGNOSIS — Z7982 Long term (current) use of aspirin: Secondary | ICD-10-CM

## 2012-12-28 DIAGNOSIS — B952 Enterococcus as the cause of diseases classified elsewhere: Secondary | ICD-10-CM | POA: Diagnosis present

## 2012-12-28 DIAGNOSIS — K219 Gastro-esophageal reflux disease without esophagitis: Secondary | ICD-10-CM | POA: Diagnosis present

## 2012-12-28 DIAGNOSIS — K449 Diaphragmatic hernia without obstruction or gangrene: Secondary | ICD-10-CM | POA: Diagnosis present

## 2012-12-28 DIAGNOSIS — G4730 Sleep apnea, unspecified: Secondary | ICD-10-CM | POA: Diagnosis present

## 2012-12-28 DIAGNOSIS — B965 Pseudomonas (aeruginosa) (mallei) (pseudomallei) as the cause of diseases classified elsewhere: Secondary | ICD-10-CM | POA: Diagnosis present

## 2012-12-28 DIAGNOSIS — Z87891 Personal history of nicotine dependence: Secondary | ICD-10-CM

## 2012-12-28 DIAGNOSIS — R6510 Systemic inflammatory response syndrome (SIRS) of non-infectious origin without acute organ dysfunction: Secondary | ICD-10-CM

## 2012-12-28 DIAGNOSIS — E1169 Type 2 diabetes mellitus with other specified complication: Principal | ICD-10-CM | POA: Diagnosis present

## 2012-12-28 DIAGNOSIS — E66811 Obesity, class 1: Secondary | ICD-10-CM | POA: Diagnosis present

## 2012-12-28 DIAGNOSIS — T84498A Other mechanical complication of other internal orthopedic devices, implants and grafts, initial encounter: Secondary | ICD-10-CM | POA: Diagnosis present

## 2012-12-28 DIAGNOSIS — R509 Fever, unspecified: Secondary | ICD-10-CM

## 2012-12-28 DIAGNOSIS — E1165 Type 2 diabetes mellitus with hyperglycemia: Secondary | ICD-10-CM | POA: Diagnosis present

## 2012-12-28 DIAGNOSIS — R109 Unspecified abdominal pain: Secondary | ICD-10-CM

## 2012-12-28 DIAGNOSIS — M129 Arthropathy, unspecified: Secondary | ICD-10-CM | POA: Diagnosis present

## 2012-12-28 DIAGNOSIS — Z79899 Other long term (current) drug therapy: Secondary | ICD-10-CM

## 2012-12-28 DIAGNOSIS — M9080 Osteopathy in diseases classified elsewhere, unspecified site: Secondary | ICD-10-CM | POA: Diagnosis present

## 2012-12-28 DIAGNOSIS — Y831 Surgical operation with implant of artificial internal device as the cause of abnormal reaction of the patient, or of later complication, without mention of misadventure at the time of the procedure: Secondary | ICD-10-CM | POA: Diagnosis present

## 2012-12-28 DIAGNOSIS — I10 Essential (primary) hypertension: Secondary | ICD-10-CM | POA: Diagnosis present

## 2012-12-28 DIAGNOSIS — N390 Urinary tract infection, site not specified: Secondary | ICD-10-CM

## 2012-12-28 DIAGNOSIS — M869 Osteomyelitis, unspecified: Secondary | ICD-10-CM | POA: Diagnosis present

## 2012-12-28 DIAGNOSIS — E785 Hyperlipidemia, unspecified: Secondary | ICD-10-CM | POA: Diagnosis present

## 2012-12-28 LAB — URINE MICROSCOPIC-ADD ON

## 2012-12-28 LAB — CREATININE, SERUM
Creatinine, Ser: 0.63 mg/dL (ref 0.50–1.35)
GFR calc Af Amer: >90 mL/min (ref >90)
GFR calc non Af Amer: >90 mL/min (ref >90)

## 2012-12-28 LAB — CBC WITH DIFFERENTIAL/PLATELET
Basophils Absolute: 0 10*3/uL (ref 0.0–0.1)
Basophils Relative: 0 % (ref 0–1)
Eosinophils Absolute: 0 10*3/uL (ref 0.0–0.7)
Eosinophils Relative: 0 % (ref 0–5)
HCT: 35 % — ABNORMAL LOW (ref 39.0–52.0)
Hemoglobin: 10.8 g/dL — ABNORMAL LOW (ref 13.0–17.0)
Lymphocytes Relative: 11 % — ABNORMAL LOW (ref 12–46)
Lymphs Abs: 0.9 10*3/uL (ref 0.7–4.0)
MCH: 23.6 pg — ABNORMAL LOW (ref 26.0–34.0)
MCHC: 30.9 g/dL (ref 30.0–36.0)
MCV: 76.4 fL — ABNORMAL LOW (ref 78.0–100.0)
Monocytes Absolute: 0.8 10*3/uL (ref 0.1–1.0)
Monocytes Relative: 10 % (ref 3–12)
Neutro Abs: 6.4 10*3/uL (ref 1.7–7.7)
Neutrophils Relative %: 79 % — ABNORMAL HIGH (ref 43–77)
Platelets: 302 10*3/uL (ref 150–400)
RBC: 4.58 MIL/uL (ref 4.22–5.81)
RDW: 17.7 % — ABNORMAL HIGH (ref 11.5–15.5)
WBC: 8.1 10*3/uL (ref 4.0–10.5)

## 2012-12-28 LAB — COMPREHENSIVE METABOLIC PANEL
ALT: 8 U/L (ref 0–53)
AST: 13 U/L (ref 0–37)
Albumin: 2.4 g/dL — ABNORMAL LOW (ref 3.5–5.2)
Alkaline Phosphatase: 78 U/L (ref 39–117)
BUN: 12 mg/dL (ref 6–23)
CO2: 22 mEq/L (ref 19–32)
Calcium: 9.1 mg/dL (ref 8.4–10.5)
Chloride: 98 mEq/L (ref 96–112)
Creatinine, Ser: 0.76 mg/dL (ref 0.50–1.35)
GFR calc Af Amer: >90 mL/min (ref >90)
GFR calc non Af Amer: >90 mL/min (ref >90)
Glucose, Bld: 147 mg/dL — ABNORMAL HIGH (ref 70–99)
Potassium: 3.6 mEq/L (ref 3.5–5.1)
Sodium: 132 mEq/L — ABNORMAL LOW (ref 135–145)
Total Bilirubin: 0.7 mg/dL (ref 0.3–1.2)
Total Protein: 9 g/dL — ABNORMAL HIGH (ref 6.0–8.3)

## 2012-12-28 LAB — URINALYSIS, ROUTINE W REFLEX MICROSCOPIC
Glucose, UA: NEGATIVE mg/dL
Ketones, ur: 40 mg/dL — AB
Nitrite: NEGATIVE
Protein, ur: >300 mg/dL — AB
Specific Gravity, Urine: 1.024 (ref 1.005–1.030)
Urobilinogen, UA: 2 mg/dL — ABNORMAL HIGH (ref 0.0–1.0)
pH: 6 (ref 5.0–8.0)

## 2012-12-28 LAB — CBC
HCT: 33.4 % — ABNORMAL LOW (ref 39.0–52.0)
Hemoglobin: 10.4 g/dL — ABNORMAL LOW (ref 13.0–17.0)
MCH: 24 pg — ABNORMAL LOW (ref 26.0–34.0)
MCHC: 31.1 g/dL (ref 30.0–36.0)
MCV: 77 fL — ABNORMAL LOW (ref 78.0–100.0)
Platelets: 286 10*3/uL (ref 150–400)
RBC: 4.34 MIL/uL (ref 4.22–5.81)
RDW: 17.7 % — ABNORMAL HIGH (ref 11.5–15.5)
WBC: 8.2 10*3/uL (ref 4.0–10.5)

## 2012-12-28 LAB — ACETAMINOPHEN LEVEL: Acetaminophen (Tylenol), Serum: <15 ug/mL (ref 10–30)

## 2012-12-28 LAB — POCT I-STAT 3, VENOUS BLOOD GAS (G3P V)
Bicarbonate: 22.9 mEq/L (ref 20.0–24.0)
O2 Saturation: 82 %
TCO2: 24 mmol/L (ref 0–100)
pCO2, Ven: 30.1 mmHg — ABNORMAL LOW (ref 45.0–50.0)
pH, Ven: 7.488 — ABNORMAL HIGH (ref 7.250–7.300)
pO2, Ven: 41 mmHg (ref 30.0–45.0)

## 2012-12-28 LAB — GLUCOSE, CAPILLARY
Glucose-Capillary: 146 mg/dL — ABNORMAL HIGH (ref 70–99)
Glucose-Capillary: 119 mg/dL — ABNORMAL HIGH (ref 70–99)
Glucose-Capillary: 107 mg/dL — ABNORMAL HIGH (ref 70–99)
Glucose-Capillary: 115 mg/dL — ABNORMAL HIGH (ref 70–99)
Glucose-Capillary: 127 mg/dL — ABNORMAL HIGH (ref 70–99)
Glucose-Capillary: 164 mg/dL — ABNORMAL HIGH (ref 70–99)

## 2012-12-28 LAB — SALICYLATE LEVEL: Salicylate Lvl: <2 mg/dL — ABNORMAL LOW (ref 2.8–20.0)

## 2012-12-28 LAB — CG4 I-STAT (LACTIC ACID): Lactic Acid, Venous: 2.17 mmol/L (ref 0.5–2.2)

## 2012-12-28 MED ORDER — ONDANSETRON HCL 4 MG PO TABS: 4.0000 mg | ORAL_TABLET | Freq: Four times a day (QID) | ORAL | Status: DC | PRN

## 2012-12-28 MED ORDER — OXYCODONE-ACETAMINOPHEN 5-325 MG PO TABS
1.0000 | ORAL_TABLET | Freq: Once | ORAL | Status: AC
  Administered 2012-12-28: 1 via ORAL
  Filled 2012-12-28: qty 1

## 2012-12-28 MED ORDER — INSULIN ASPART 100 UNIT/ML ~~LOC~~ SOLN
0.0000 [IU] | Freq: Three times a day (TID) | SUBCUTANEOUS | Status: DC
  Administered 2012-12-28 – 2012-12-29 (×2): 2 [IU] via SUBCUTANEOUS
  Administered 2012-12-30 – 2013-01-03 (×4): 3 [IU] via SUBCUTANEOUS
  Administered 2013-01-04: 2 [IU] via SUBCUTANEOUS
  Administered 2013-01-05: 3 [IU] via SUBCUTANEOUS
  Administered 2013-01-05: 2 [IU] via SUBCUTANEOUS
  Administered 2013-01-06: 3 [IU] via SUBCUTANEOUS
  Administered 2013-01-06: 2 [IU] via SUBCUTANEOUS

## 2012-12-28 MED ORDER — LOSARTAN POTASSIUM 50 MG PO TABS
100.0000 mg | ORAL_TABLET | Freq: Every day | ORAL | Status: DC
  Administered 2012-12-28 – 2013-01-07 (×10): 100 mg via ORAL
  Filled 2012-12-28 (×11): qty 2

## 2012-12-28 MED ORDER — ONDANSETRON HCL 4 MG/2ML IJ SOLN: 4.0000 mg | Freq: Four times a day (QID) | INTRAMUSCULAR | Status: DC | PRN

## 2012-12-28 MED ORDER — INSULIN GLARGINE 100 UNIT/ML ~~LOC~~ SOLN
80.0000 [IU] | Freq: Every day | SUBCUTANEOUS | Status: DC
  Administered 2012-12-28 – 2013-01-06 (×8): 80 [IU] via SUBCUTANEOUS

## 2012-12-28 MED ORDER — ENOXAPARIN SODIUM 40 MG/0.4ML ~~LOC~~ SOLN
40.0000 mg | SUBCUTANEOUS | Status: DC
  Administered 2012-12-28 – 2012-12-31 (×4): 40 mg via SUBCUTANEOUS
  Filled 2012-12-28 (×5): qty 0.4

## 2012-12-28 MED ORDER — INSULIN ASPART 100 UNIT/ML ~~LOC~~ SOLN
0.0000 [IU] | Freq: Every day | SUBCUTANEOUS | Status: DC
  Administered 2013-01-02 – 2013-01-06 (×3): 2 [IU] via SUBCUTANEOUS

## 2012-12-28 MED ORDER — ACETAMINOPHEN 650 MG RE SUPP: 650.0000 mg | Freq: Four times a day (QID) | RECTAL | Status: DC | PRN

## 2012-12-28 MED ORDER — SODIUM CHLORIDE 0.9 % IV BOLUS (SEPSIS)
1000.0000 mL | Freq: Once | INTRAVENOUS | Status: AC
  Administered 2012-12-28: 1000 mL via INTRAVENOUS

## 2012-12-28 MED ORDER — PANTOPRAZOLE SODIUM 40 MG PO TBEC
40.0000 mg | DELAYED_RELEASE_TABLET | Freq: Every day | ORAL | Status: DC
  Administered 2012-12-28 – 2013-01-07 (×10): 40 mg via ORAL
  Filled 2012-12-28 (×5): qty 1

## 2012-12-28 MED ORDER — DEXTROSE 5 % IV SOLN
1.0000 g | Freq: Once | INTRAVENOUS | Status: AC
  Administered 2012-12-28: 1 g via INTRAVENOUS
  Filled 2012-12-28: qty 10

## 2012-12-28 MED ORDER — ASPIRIN 325 MG PO TABS
325.0000 mg | ORAL_TABLET | Freq: Every day | ORAL | Status: DC
  Administered 2012-12-28 – 2012-12-31 (×4): 325 mg via ORAL
  Filled 2012-12-28 (×5): qty 1

## 2012-12-28 MED ORDER — POTASSIUM CHLORIDE CRYS ER 20 MEQ PO TBCR
20.0000 meq | EXTENDED_RELEASE_TABLET | Freq: Two times a day (BID) | ORAL | Status: DC
  Administered 2012-12-28 – 2013-01-07 (×12): 20 meq via ORAL
  Filled 2012-12-28 (×22): qty 1

## 2012-12-28 MED ORDER — DEXTROSE 5 % IV SOLN
1.0000 g | INTRAVENOUS | Status: DC
  Administered 2012-12-28 – 2012-12-30 (×3): 1 g via INTRAVENOUS
  Filled 2012-12-28 (×4): qty 10

## 2012-12-28 MED ORDER — OXYCODONE-ACETAMINOPHEN 5-325 MG PO TABS
2.0000 | ORAL_TABLET | Freq: Four times a day (QID) | ORAL | Status: DC | PRN
  Administered 2012-12-28 – 2012-12-31 (×8): 2 via ORAL
  Filled 2012-12-28 (×9): qty 2

## 2012-12-28 MED ORDER — IOHEXOL 300 MG/ML  SOLN
20.0000 mL | INTRAMUSCULAR | Status: AC
  Administered 2012-12-28: 20 mL via ORAL

## 2012-12-28 MED ORDER — IOHEXOL 300 MG/ML  SOLN
100.0000 mL | Freq: Once | INTRAMUSCULAR | Status: AC | PRN
  Administered 2012-12-28: 100 mL via INTRAVENOUS

## 2012-12-28 MED ORDER — FUROSEMIDE 40 MG PO TABS
40.0000 mg | ORAL_TABLET | Freq: Every day | ORAL | Status: DC
  Administered 2012-12-28 – 2013-01-07 (×10): 40 mg via ORAL
  Filled 2012-12-28 (×11): qty 1

## 2012-12-28 MED ORDER — SODIUM CHLORIDE 0.9 % IJ SOLN: 3.0000 mL | INTRAMUSCULAR | Status: DC | PRN

## 2012-12-28 MED ORDER — SODIUM CHLORIDE 0.9 % IV SOLN
Freq: Once | INTRAVENOUS | Status: AC
  Administered 2012-12-28: 05:00:00 via INTRAVENOUS

## 2012-12-28 MED ORDER — AMLODIPINE BESYLATE 5 MG PO TABS
5.0000 mg | ORAL_TABLET | Freq: Every day | ORAL | Status: DC
  Administered 2012-12-28 – 2013-01-07 (×10): 5 mg via ORAL
  Filled 2012-12-28 (×11): qty 1

## 2012-12-28 MED ORDER — ACETAMINOPHEN 325 MG PO TABS: 650.0000 mg | ORAL_TABLET | Freq: Four times a day (QID) | ORAL | Status: DC | PRN

## 2012-12-28 MED ORDER — ACETAMINOPHEN 325 MG PO TABS
325.0000 mg | ORAL_TABLET | Freq: Once | ORAL | Status: AC
  Administered 2012-12-28: 325 mg via ORAL
  Filled 2012-12-28: qty 1

## 2012-12-28 MED ORDER — FENTANYL CITRATE 0.05 MG/ML IJ SOLN
50.0000 ug | Freq: Once | INTRAMUSCULAR | Status: AC
  Administered 2012-12-28: 50 ug via INTRAVENOUS
  Filled 2012-12-28: qty 2

## 2012-12-28 MED ORDER — ALUM & MAG HYDROXIDE-SIMETH 200-200-20 MG/5ML PO SUSP: 30.0000 mL | Freq: Four times a day (QID) | ORAL | Status: DC | PRN

## 2012-12-28 MED ORDER — SIMVASTATIN 20 MG PO TABS
20.0000 mg | ORAL_TABLET | Freq: Every evening | ORAL | Status: DC
  Administered 2012-12-28 – 2013-01-06 (×9): 20 mg via ORAL
  Filled 2012-12-28 (×11): qty 1

## 2012-12-28 NOTE — ED Notes (Signed)
Pt states that he had back surgery on L3-5 in sept 6 2013 and has been having back pain off and on since.  Pt states he seems to have urinary issues when the back pain is in full effect.  Pt states the percocet's he normally takes have been switched to generic and are not working for his pain as well.

## 2012-12-28 NOTE — H&P (Signed)
PCP:   Lillia Mountain, MD   Chief Complaint:  Confusion  HPI: This is a 67 year old gentleman who on Monday developed severe groin pain. He was in bed the last 2 days because of this pain. Last night at approximately 10 PM he became acutely confused, his wife brought him to the ER. At home he had no fever, nausea or vomiting, burning on urination, flank pain or hematuria. Here in the ER patient was aroused 102.9 and he was found to have urinary tract infection. He received antibiotics and antipyretics and he is improved. The hospitalist service was called and requested to admit. History obtained mainly from the wife who is at the bedside. Patient is alert and oriented but sleepy.  Review of Systems:  The patient denies anorexia, weight loss,, vision loss, decreased hearing, hoarseness, chest pain, syncope, dyspnea on exertion, peripheral edema, balance deficits, hemoptysis, abdominal pain, melena, hematochezia, severe indigestion/heartburn, hematuria, incontinence, genital sores, muscle weakness, suspicious skin lesions, transient blindness, difficulty walking, depression, unusual weight change, abnormal bleeding, enlarged lymph nodes, angioedema, and breast masses.  Past Medical History: Past Medical History  Diagnosis Date  . Hypertension   . Diabetes mellitus   . GERD (gastroesophageal reflux disease)   . H/O hiatal hernia   . Arthritis   . Sleep apnea     . Last test was before 2007 , not sure name of test site.   Past Surgical History  Procedure Date  . Colonoscopy 2011  . Hiatal hernia repair     x2  . Eye surgery     Cataract 2013  . Knee arthroscopy     bil  . Patella fracture surgery     Medications: Prior to Admission medications   Medication Sig Start Date End Date Taking? Authorizing Provider  amLODipine (NORVASC) 10 MG tablet Take 5 mg by mouth daily.     Historical Provider, MD  aspirin 325 MG tablet Take 325 mg by mouth daily.    Historical Provider, MD    furosemide (LASIX) 40 MG tablet Take 40 mg by mouth daily.    Historical Provider, MD  insulin aspart (NOVOLOG) 100 UNIT/ML injection Inject 40 Units into the skin 3 (three) times daily before meals.    Historical Provider, MD  insulin glargine (LANTUS) 100 UNIT/ML injection Inject 100 Units into the skin at bedtime.    Historical Provider, MD  losartan (COZAAR) 100 MG tablet Take 100 mg by mouth daily.    Historical Provider, MD  metFORMIN (GLUCOPHAGE) 500 MG tablet Take 1,000 mg by mouth daily with breakfast. 1000mg  in the morning; 1500mg  in the evening    Historical Provider, MD  metFORMIN (GLUMETZA) 1000 MG (MOD) 24 hr tablet Take 1,500 mg by mouth every evening.    Historical Provider, MD  Multiple Vitamin (MULTIVITAMIN WITH MINERALS) TABS Take 1 tablet by mouth daily.    Historical Provider, MD  omeprazole (PRILOSEC) 40 MG capsule Take 40 mg by mouth 2 (two) times daily.    Historical Provider, MD  potassium chloride SA (K-DUR,KLOR-CON) 20 MEQ tablet Take 20 mEq by mouth 2 (two) times daily.     Historical Provider, MD  simvastatin (ZOCOR) 20 MG tablet Take 20 mg by mouth every evening.    Historical Provider, MD  valsartan (DIOVAN) 160 MG tablet Take 160 mg by mouth daily.    Historical Provider, MD    Allergies:  No Known Allergies  Social History:  reports that he quit smoking about 32 years ago. He does  not have any smokeless tobacco history on file. He reports that he does not drink alcohol or use illicit drugs. uses a cane  Family History: CAD  Physical Exam: Filed Vitals:   12/28/12 0130 12/28/12 0144 12/28/12 0233 12/28/12 0435  BP: 148/81  160/79   Pulse: 99  96   Temp:  102.9 F (39.4 C) 100.4 F (38 C) 98.1 F (36.7 C)  TempSrc:  Rectal Oral Oral  Resp:   33   Height:      Weight:      SpO2: 93%  94%     General:  Alert and oriented times three, well developed and nourished, no acute distress Eyes: PERRLA, pink conjunctiva, no scleral icterus ENT: Moist oral  mucosa, neck supple, no thyromegaly Lungs: clear to ascultation, no wheeze, no crackles, no use of accessory muscles Cardiovascular: regular rate and rhythm, no regurgitation, no gallops, no murmurs. No carotid bruits, no JVD Abdomen: soft, positive BS, right lower quadrant tenderness/suprapubic, non-distended, no organomegaly, not an acute abdomen GU: not examined Neuro: CN II - XII grossly intact, sensation intact Musculoskeletal: strength 5/5 all extremities, no clubbing, cyanosis or edema Skin: no rash, no subcutaneous crepitation, no decubitus Psych: appropriate patient   Labs on Admission:   [facility] 12/28/12 0151  NA 132*  K 3.6  CL 98  CO2 22  GLUCOSE 147*  BUN 12  CREATININE 0.76  CALCIUM 9.1  MG --  PHOS --    Basename 12/28/12 0151  AST 13  ALT 8  ALKPHOS 78  BILITOT 0.7  PROT 9.0*  ALBUMIN 2.4*   No results found for this basename: LIPASE:2,AMYLASE:2 in the last 72 hours  Basename 12/28/12 0151  WBC 8.1  NEUTROABS 6.4  HGB 10.8*  HCT 35.0*  MCV 76.4*  PLT 302    Micro Results: Results for DAVON, ABDELAZIZ (MRN [id number]) as of 12/28/2012 06:14  Ref. Range 12/28/2012 01:10  Color, Urine Latest Range: YELLOW  AMBER (A)  APPearance Latest Range: CLEAR  CLOUDY (A)  Specific Gravity, Urine Latest Range: 1.005-1.030  1.024  pH Latest Range: 5.0-8.0  6.0  Glucose Latest Range: NEGATIVE mg/dL NEGATIVE  Bilirubin Urine Latest Range: NEGATIVE  SMALL (A)  Ketones, ur Latest Range: NEGATIVE mg/dL 40 (A)  Protein Latest Range: NEGATIVE mg/dL >119 (A)  Urobilinogen, UA Latest Range: 0.0-1.0 mg/dL 2.0 (H)  Nitrite Latest Range: NEGATIVE  NEGATIVE  Leukocytes, UA Latest Range: NEGATIVE  SMALL (A)  Hgb urine dipstick Latest Range: NEGATIVE  LARGE (A)  Urine-Other No range found MUCOUS PRESENT  WBC, UA Latest Range: <3 WBC/hpf 21-50  RBC / HPF Latest Range: <3 RBC/hpf 11-20  Squamous Epithelial / LPF Latest Range: RARE  RARE  Bacteria, UA Latest Range: RARE  FEW  (A)  Casts Latest Range: NEGATIVE  HYALINE CASTS (A)     Radiological Exams on Admission: Ct Abdomen Pelvis W Contrast  12/28/2012  *RADIOLOGY REPORT*  Clinical Data: Lower abdominal pain, back pain, prior lumbar surgery L3-L5 on 08/30/2012  CT ABDOMEN AND PELVIS WITH CONTRAST  Technique:  Multidetector CT imaging of the abdomen and pelvis was performed following the standard protocol during bolus administration of intravenous contrast. Sagittal and coronal MPR images reconstructed from axial data set.  Contrast: OMNIPAQUE IOHEXOL 300 MG/ML  SOLN Dilute oral contrast.  Comparison: None Correlation:  MRI lumbar spine 02/29/2012  Findings: Bibasilar atelectasis greater on the right. Tiny left pleural effusion. Moderate sized hiatal hernia. Liver, spleen, pancreas, and adrenal glands normal  appearance. Symmetric nephrograms with bilateral nonobstructing renal calculi largest 9 mm diameter right kidney. Small ventral hernia containing fat. Additional ventral hernia versus prior lax ventral hernia repair, containing nonobstructed bowel loop.  Retroaortic left renal vein. Anterior displacement of aorta and IVC from the spine by abnormal prevertebral soft tissue. This could represent a hematoma from prior surgery, developing fibrosis, or infection. No discrete enhancing collections to confirm abscess is identified.  Prior L3-L5 posterior fusion with disc prostheses at these levels. Mild lucency surrounding the left pedicle screw at L5. Endplates at inferior L4/superior L5 are slightly indistinct, could related to prior surgery but infection is not excluded. Diverticulosis of the descending and sigmoid colon without definite evidence of acute diverticulitis. No mass, adenopathy, free fluid or additional inflammatory process.  IMPRESSION: Nonobstructing renal calculi bilaterally. Ventral hernias. Descending sigmoid colonic diverticulosis without evidence of acute diverticulitis. Interval L3-L5 fusion with  abnormal soft tissue prevertebral at the ankle levels, question related to hemorrhage or fibrosis though infection is not completely excluded. Indistinct endplates at L4-L5 as well as lucency surrounding the left pedicle screw at L5, cannot exclude infection.   Original Report Authenticated By: Ulyses Southward, M.D.     Assessment/Plan Present on Admission:  Acute encephalopathy  Urinary tract infection Admit to medical floor Urine cultures and blood cultures ordered IV antibiotics and antipyretics ordered  . DIABETES MELLITUS . HYPERLIPIDEMIA . OBESITY, MORBID . HYPERTENSION . SLEEP APNEA Stable resume home medication ADA diet and sliding scale insulin CPAP machine family to use their own Medications Family unclear of what medications, wife to bring in prescription bottles later Lumbar interbody arthrodesis L3/L4 abnormal soft tissue prevertebral at the ankle levels Dr. Norlene Campbell spoke to the neurosurgeon on call Dr. Lovell Sheehan, who states this is likely an incidental finding. Neurosurgeon aware  Franky Macho is to see patient in a.m.   Full code DVT prophylaxis   Lada Fulbright 12/28/2012, 6:14 AM

## 2012-12-28 NOTE — ED Provider Notes (Signed)
History     CSN: [id number]  Arrival date & time 12/28/12  0047   First MD Initiated Contact with Patient 12/28/12 [phone]      Chief Complaint  Patient presents with  . Back Pain  . Urinary Frequency    (Consider location/radiation/quality/duration/timing/severity/associated sxs/prior treatment) HPI 67 year old male presents to the emergency apartment with complaint of groin pain and increased urination since Thursday. Most of the history comes from wife as patient is confused. Patient reports the reason he is here is "I can't get any Percocet, and my wife made me come here". Patient with history of back surgery in September, has been doing well since that time. He is chronically on Percocet. Patient developed lower abdominal pain earlier in the week, worse and yesterday. Wife reports patient has stayed in the bed for the last 2 days, hasn't eaten or drank very little.  He should have history of hypertension, diabetes. Wife reports his blood sugars have been good. They deny any fevers. No vomiting diarrhea. No incontinence. They report that patient lifted his grandson about a week ago, but did not have any symptoms until the last 48 hours.  Past Medical History  Diagnosis Date  . Hypertension   . Diabetes mellitus   . GERD (gastroesophageal reflux disease)   . H/O hiatal hernia   . Arthritis   . Sleep apnea     . Last test was before 2007 , not sure name of test site.    Past Surgical History  Procedure Date  . Colonoscopy 2011  . Hiatal hernia repair     x2  . Eye surgery     Cataract 2013  . Knee arthroscopy     bil  . Patella fracture surgery     No family history on file.  History  Substance Use Topics  . Smoking status: Former Smoker -- 16 years    Quit date: 09/04/1980  . Smokeless tobacco: Not on file  . Alcohol Use: No     Comment: rarely      Review of Systems  Unable to perform ROS: Mental status change    Allergies  Review of patient's allergies  indicates no known allergies.  Home Medications   Current Outpatient Rx  Name  Route  Sig  Dispense  Refill  . AMLODIPINE BESYLATE 10 MG PO TABS   Oral   Take 5 mg by mouth daily.          . ASPIRIN 325 MG PO TABS   Oral   Take 325 mg by mouth daily.         . FUROSEMIDE 40 MG PO TABS   Oral   Take 40 mg by mouth daily.         . INSULIN ASPART 100 UNIT/ML [place] SOLN   Subcutaneous   Inject 40 Units into the skin 3 (three) times daily before meals.         . INSULIN GLARGINE 100 UNIT/ML [place] SOLN   Subcutaneous   Inject 100 Units into the skin at bedtime.         Marland Kitchen LOSARTAN POTASSIUM 100 MG PO TABS   Oral   Take 100 mg by mouth daily.         Marland Kitchen METFORMIN HCL 500 MG PO TABS   Oral   Take 1,000 mg by mouth daily with breakfast. 1000mg  in the morning; 1500mg  in the evening         . METFORMIN HCL ER (MOD)  1000 MG PO TB24   Oral   Take 1,500 mg by mouth every evening.         . ADULT MULTIVITAMIN W/MINERALS CH   Oral   Take 1 tablet by mouth daily.         Marland Kitchen OMEPRAZOLE 40 MG PO CPDR   Oral   Take 40 mg by mouth 2 (two) times daily.         Marland Kitchen POTASSIUM CHLORIDE CRYS ER 20 MEQ PO TBCR   Oral   Take 20 mEq by mouth 2 (two) times daily.          Marland Kitchen SIMVASTATIN 20 MG PO TABS   Oral   Take 20 mg by mouth every evening.         Marland Kitchen VALSARTAN 160 MG PO TABS   Oral   Take 160 mg by mouth daily.           BP 160/79  Pulse 96  Temp 100.4 F (38 C) (Oral)  Resp 33  Ht 6\' 2"  (1.88 m)  Wt 250 lb (113.399 kg)  BMI 32.10 kg/m2  SpO2 94%  Physical Exam  Nursing note and vitals reviewed. Constitutional: He appears distressed (ill-appearing, tachypneic, confused).  HENT:  Head: Normocephalic and atraumatic.       Dry mucous membranes  Eyes: Conjunctivae normal and EOM are normal. Pupils are equal, round, and reactive to light.  Neck: Normal range of motion. Neck supple. No JVD present. No tracheal deviation present. No thyromegaly present.    Cardiovascular: Regular rhythm, normal heart sounds and intact distal pulses.  Exam reveals no gallop and no friction rub.   No murmur heard.      Mild tachycardia  Pulmonary/Chest: No stridor. He is in respiratory distress (patient is tachypneic). He has no wheezes. He has no rales. He exhibits no tenderness.  Abdominal: Soft. There is tenderness (diffuse tenderness across lower abdomen worse in left lower quadrant). There is no rebound and no guarding.  Musculoskeletal: Normal range of motion. He exhibits no edema and no tenderness.  Lymphadenopathy:    He has no cervical adenopathy.  Neurological: He exhibits normal muscle tone. Coordination normal.       Patient falls asleep often during evaluation. He is oriented to self and that is at the hospital. He is overall confused and repeats himself often.  Skin: Skin is dry. No rash noted. No erythema. No pallor.       Patient is hot to touch    ED Course  Procedures (including critical care time)  CRITICAL CARE Performed by: Olivia Mackie   Total critical care time: 60  Critical care time was exclusive of separately billable procedures and treating other patients.  Critical care was necessary to treat or prevent imminent or life-threatening deterioration.  Critical care was time spent personally by me on the following activities: development of treatment plan with patient and/or surrogate as well as nursing, discussions with consultants, evaluation of patient's response to treatment, examination of patient, obtaining history from patient or surrogate, ordering and performing treatments and interventions, ordering and review of laboratory studies, ordering and review of radiographic studies, pulse oximetry and re-evaluation of patient's condition.   Labs Reviewed  URINALYSIS, ROUTINE W REFLEX MICROSCOPIC - Abnormal; Notable for the following:    Color, Urine AMBER (*)  BIOCHEMICALS MAY BE AFFECTED BY COLOR   APPearance CLOUDY (*)      Hgb urine dipstick LARGE (*)     Bilirubin Urine SMALL (*)  Ketones, ur 40 (*)     Protein, ur >300 (*)     Urobilinogen, UA 2.0 (*)     Leukocytes, UA SMALL (*)     All other components within normal limits  COMPREHENSIVE METABOLIC PANEL - Abnormal; Notable for the following:    Sodium 132 (*)     Glucose, Bld 147 (*)     Total Protein 9.0 (*)     Albumin 2.4 (*)     All other components within normal limits  CBC WITH DIFFERENTIAL - Abnormal; Notable for the following:    Hemoglobin 10.8 (*)     HCT 35.0 (*)     MCV 76.4 (*)     MCH 23.6 (*)     RDW 17.7 (*)     Neutrophils Relative 79 (*)     Lymphocytes Relative 11 (*)     All other components within normal limits  SALICYLATE LEVEL - Abnormal; Notable for the following:    Salicylate Lvl <2.0 (*)     All other components within normal limits  GLUCOSE, CAPILLARY - Abnormal; Notable for the following:    Glucose-Capillary 146 (*)     All other components within normal limits  URINE MICROSCOPIC-ADD ON - Abnormal; Notable for the following:    Bacteria, UA FEW (*)     Casts HYALINE CASTS (*)     All other components within normal limits  POCT I-STAT 3, BLOOD GAS (G3P V) - Abnormal; Notable for the following:    pH, Ven 7.488 (*)     pCO2, Ven 30.1 (*)     All other components within normal limits  ACETAMINOPHEN LEVEL  CG4 I-STAT (LACTIC ACID)  BLOOD GAS, VENOUS  CULTURE, BLOOD (ROUTINE X 2)  CULTURE, BLOOD (ROUTINE X 2)  URINE CULTURE   Ct Abdomen Pelvis W Contrast  12/28/2012  *RADIOLOGY REPORT*  Clinical Data: Lower abdominal pain, back pain, prior lumbar surgery L3-L5 on 08/30/2012  CT ABDOMEN AND PELVIS WITH CONTRAST  Technique:  Multidetector CT imaging of the abdomen and pelvis was performed following the standard protocol during bolus administration of intravenous contrast. Sagittal and coronal MPR images reconstructed from axial data set.  Contrast: OMNIPAQUE IOHEXOL 300 MG/ML  SOLN Dilute oral contrast.   Comparison: None Correlation:  MRI lumbar spine 02/29/2012  Findings: Bibasilar atelectasis greater on the right. Tiny left pleural effusion. Moderate sized hiatal hernia. Liver, spleen, pancreas, and adrenal glands normal appearance. Symmetric nephrograms with bilateral nonobstructing renal calculi largest 9 mm diameter right kidney. Small ventral hernia containing fat. Additional ventral hernia versus prior lax ventral hernia repair, containing nonobstructed bowel loop.  Retroaortic left renal vein. Anterior displacement of aorta and IVC from the spine by abnormal prevertebral soft tissue. This could represent a hematoma from prior surgery, developing fibrosis, or infection. No discrete enhancing collections to confirm abscess is identified.  Prior L3-L5 posterior fusion with disc prostheses at these levels. Mild lucency surrounding the left pedicle screw at L5. Endplates at inferior L4/superior L5 are slightly indistinct, could related to prior surgery but infection is not excluded. Diverticulosis of the descending and sigmoid colon without definite evidence of acute diverticulitis. No mass, adenopathy, free fluid or additional inflammatory process.  IMPRESSION: Nonobstructing renal calculi bilaterally. Ventral hernias. Descending sigmoid colonic diverticulosis without evidence of acute diverticulitis. Interval L3-L5 fusion with abnormal soft tissue prevertebral at the ankle levels, question related to hemorrhage or fibrosis though infection is not completely excluded. Indistinct endplates at L4-L5 as well as  lucency surrounding the left pedicle screw at L5, cannot exclude infection.   Original Report Authenticated By: Ulyses Southward, M.D.      No diagnosis found.    MDM  67 year old male with confusion, lower abdominal pain. Concern for urinary tract infection causing altered mental status. Differential also includes diverticulitis, DKA, bacteremia/sepsis, aspirin overdose. We'll get labs, blood cultures  urine cultures lactate. Plan for CT abdomen pelvis. Expect patient will need admission   Discussed case with Dr Lovell Sheehan on call for Dr Franky Macho regarding the CT findings at site of prior surgery.  He does not feel that the soft tissue swelling is cause for current infection, and recommends f/u in the office with Dr Franky Macho after d/c home.     Olivia Mackie, MD 12/28/12 [phone]

## 2012-12-28 NOTE — Progress Notes (Signed)
Addendum to H&P done today 12/28/2012  Patient seen and examined at bedside. Agree with assessment and plan done by Dr. Mayme Genta. UTI - continue rocephin - continue current analgesia - will inform Dr. Valentina Lucks  Of the patient's admission  Manson Passey [facility] [phone]

## 2012-12-28 NOTE — ED Notes (Signed)
hospitaist in room with pt

## 2012-12-28 NOTE — ED Notes (Signed)
Patient transported to CT 

## 2012-12-29 DIAGNOSIS — R109 Unspecified abdominal pain: Secondary | ICD-10-CM

## 2012-12-29 LAB — SEDIMENTATION RATE: Sed Rate: 130 mm/hr — ABNORMAL HIGH (ref 0–16)

## 2012-12-29 LAB — CBC
HCT: 31.7 % — ABNORMAL LOW (ref 39.0–52.0)
Hemoglobin: 9.6 g/dL — ABNORMAL LOW (ref 13.0–17.0)
MCH: 23.8 pg — ABNORMAL LOW (ref 26.0–34.0)
MCHC: 30.3 g/dL (ref 30.0–36.0)
MCV: 78.7 fL (ref 78.0–100.0)
Platelets: 295 10*3/uL (ref 150–400)
RBC: 4.03 MIL/uL — ABNORMAL LOW (ref 4.22–5.81)
RDW: 17.7 % — ABNORMAL HIGH (ref 11.5–15.5)
WBC: 6.8 10*3/uL (ref 4.0–10.5)

## 2012-12-29 LAB — BASIC METABOLIC PANEL
BUN: 15 mg/dL (ref 6–23)
CO2: 24 mEq/L (ref 19–32)
Calcium: 8.9 mg/dL (ref 8.4–10.5)
Chloride: 101 mEq/L (ref 96–112)
Creatinine, Ser: 0.75 mg/dL (ref 0.50–1.35)
GFR calc Af Amer: >90 mL/min (ref >90)
GFR calc non Af Amer: >90 mL/min (ref >90)
Glucose, Bld: 126 mg/dL — ABNORMAL HIGH (ref 70–99)
Potassium: 3.6 mEq/L (ref 3.5–5.1)
Sodium: 136 mEq/L (ref 135–145)

## 2012-12-29 LAB — GLUCOSE, CAPILLARY
Glucose-Capillary: 118 mg/dL — ABNORMAL HIGH (ref 70–99)
Glucose-Capillary: 128 mg/dL — ABNORMAL HIGH (ref 70–99)
Glucose-Capillary: 142 mg/dL — ABNORMAL HIGH (ref 70–99)
Glucose-Capillary: 94 mg/dL (ref 70–99)

## 2012-12-29 NOTE — Progress Notes (Signed)
[facility] PROGRESS NOTE  EILAN MCINERNY RUE:[id number] DOB: March 03, 1946 DOA: 12/28/2012 PCP: Lillia Mountain, MD  Assessment/Plan: 1. Flank pain/ groin pain : UTI vs nephrolithiasis noted on CT. Tmax overnight 100.3. Will check daily CBC for WBC monitoring. Currently on rocephin pending urine culture. U/A showed few bacteria and some blood RBCs. Cont w/ percocet for pain management.   2.  Elevated protein w/ low albumin: will check SPEP, UPEP. Otherwise monitor w/ daily BMP.   3. Lumbar interbody arthrodesis L3/L4  abnormal soft tissue prevertebral at the ankle levels :  Dr. Norlene Campbell spoke to the neurosurgeon on call Dr. Lovell Sheehan, who states this is likely an incidental finding. Neurosurgeon aware Franky Macho is to see patient.   Active Problems:  DIABETES MELLITUS: home lantus + SSI   HYPERLIPIDEMIA: home statin   HYPERTENSION: home meds   SLEEP APNEA: family to use their own CPAP      Alysia Penna, MD  Internal Medicine Pager [phone].  If 7PM-7AM, please contact night-coverage at [email or web address], password [facility] 12/29/2012, 8:46 AM   LOS: 1 day   Brief narrative: 67 year old male presents to the emergency apartment with complaint of groin pain and increased urination since Thursday.   Consultants:  NSG   Procedures:  None   Antibiotics:  Rocephin 1/4 -->  ?  HPI/Subjective: Still having some groin pain   Objective: Filed Vitals:   12/28/12 1800 12/28/12 2200 12/29/12 0214 12/29/12 0601  BP: 134/72 122/62 123/68 110/66  Pulse: 87 88 82 76  Temp: 98.7 F (37.1 C) 100.3 F (37.9 C) 98.3 F (36.8 C) 98 F (36.7 C)  TempSrc: Oral Oral Oral Oral  Resp: 20 20 20 19   Height:      Weight:      SpO2: 98% 95% 97% 96%    Intake/Output Summary (Last 24 hours) at 12/29/12 0846 Last data filed at 12/28/12 2200  Gross per 24 hour  Intake   1739 ml  Output   1320 ml  Net    419 ml    Exam:   General:  AAM in mild distress   Cardiovascular: RRR no MRG     Respiratory: CTAB. No wheezes or rales   Abd: TTP in the lower quadrants, + BS, no masses  Skin: no rashs, lesions    Neuro : AAOx3   Data Reviewed: Basic Metabolic Panel:  Lab 12/28/12 6213 12/28/12 0151  NA -- 132*  K -- 3.6  CL -- 98  CO2 -- 22  GLUCOSE -- 147*  BUN -- 12  CREATININE 0.63 0.76  CALCIUM -- 9.1  MG -- --  PHOS -- --   Liver Function Tests:  Lab 12/28/12 0151  AST 13  ALT 8  ALKPHOS 78  BILITOT 0.7  PROT 9.0*  ALBUMIN 2.4*   No results found for this basename: LIPASE:5,AMYLASE:5 in the last 168 hours No results found for this basename: AMMONIA:5 in the last 168 hours CBC:  Lab 12/29/12 0749 12/28/12 0931 12/28/12 0151  WBC 6.8 8.2 8.1  NEUTROABS -- -- 6.4  HGB 9.6* 10.4* 10.8*  HCT 31.7* 33.4* 35.0*  MCV 78.7 77.0* 76.4*  PLT 295 286 302   Cardiac Enzymes: No results found for this basename: CKTOTAL:5,CKMB:5,CKMBINDEX:5,TROPONINI:5 in the last 168 hours BNP: No components found with this basename: POCBNP:5 CBG:  Lab 12/29/12 0653 12/28/12 2134 12/28/12 1645 12/28/12 1200 12/28/12 0852  GLUCAP 118* 164* 127* 115* 107*    No results found for this or any previous visit (from  the past 240 hour(s)).   Studies: Ct Abdomen Pelvis W Contrast  12/28/2012  *RADIOLOGY REPORT*  Clinical Data: Lower abdominal pain, back pain, prior lumbar surgery L3-L5 on 08/30/2012  CT ABDOMEN AND PELVIS WITH CONTRAST  Technique:  Multidetector CT imaging of the abdomen and pelvis was performed following the standard protocol during bolus administration of intravenous contrast. Sagittal and coronal MPR images reconstructed from axial data set.  Contrast: OMNIPAQUE IOHEXOL 300 MG/ML  SOLN Dilute oral contrast.  Comparison: None Correlation:  MRI lumbar spine 02/29/2012  Findings: Bibasilar atelectasis greater on the right. Tiny left pleural effusion. Moderate sized hiatal hernia. Liver, spleen, pancreas, and adrenal glands normal appearance. Symmetric  nephrograms with bilateral nonobstructing renal calculi largest 9 mm diameter right kidney. Small ventral hernia containing fat. Additional ventral hernia versus prior lax ventral hernia repair, containing nonobstructed bowel loop.  Retroaortic left renal vein. Anterior displacement of aorta and IVC from the spine by abnormal prevertebral soft tissue. This could represent a hematoma from prior surgery, developing fibrosis, or infection. No discrete enhancing collections to confirm abscess is identified.  Prior L3-L5 posterior fusion with disc prostheses at these levels. Mild lucency surrounding the left pedicle screw at L5. Endplates at inferior L4/superior L5 are slightly indistinct, could related to prior surgery but infection is not excluded. Diverticulosis of the descending and sigmoid colon without definite evidence of acute diverticulitis. No mass, adenopathy, free fluid or additional inflammatory process.  IMPRESSION: Nonobstructing renal calculi bilaterally. Ventral hernias. Descending sigmoid colonic diverticulosis without evidence of acute diverticulitis. Interval L3-L5 fusion with abnormal soft tissue prevertebral at the ankle levels, question related to hemorrhage or fibrosis though infection is not completely excluded. Indistinct endplates at L4-L5 as well as lucency surrounding the left pedicle screw at L5, cannot exclude infection.   Original Report Authenticated By: Ulyses Southward, M.D.     Scheduled Meds:   . amLODipine  5 mg Oral Daily  . aspirin  325 mg Oral Daily  . cefTRIAXone (ROCEPHIN)  IV  1 g Intravenous Q24H  . enoxaparin (LOVENOX) injection  40 mg Subcutaneous Q24H  . furosemide  40 mg Oral Daily  . insulin aspart  0-15 Units Subcutaneous TID WC  . insulin aspart  0-5 Units Subcutaneous QHS  . insulin glargine  80 Units Subcutaneous QHS  . losartan  100 mg Oral Daily  . pantoprazole  40 mg Oral Daily  . potassium chloride SA  20 mEq Oral BID  . simvastatin  20 mg Oral QPM    Continuous Infusions:    Time Spent: 45 minutes

## 2012-12-29 NOTE — Consult Note (Signed)
Reason for Consult: Back pain status post arthrodesis 3 months, recent febrile illness. Referring Physician: Drucie Ip is an 67 y.o. male.  HPI: Patient is a 67 year old individual who in September of this year underwent decompression and arthrodesis from L3-L5. Been having some significant degree of back pain and was requiring Percocet 09/26/2024 up to 4 tablets a day. Fairly acutely on New Year's Eve the patient developed severe back pain with a febrile episode is brought to the emergency department where was felt that he had a urinary tract infection. CT scan of lumbar spine was performed which demonstrated some soft tissue swelling in the lower lumbar spinal region in addition to evidence of halo formation around the hardware particularly on the left side at L5. Patient was started on Rocephin for presumed urinary tract infection and is febrile illness appears to have decreased substantially however he is having substantial difficulty with back pain particularly with turning he also experiences chronic pain of both lower extremities.  Past Medical History  Diagnosis Date  . Hypertension   . Diabetes mellitus   . GERD (gastroesophageal reflux disease)   . H/O hiatal hernia   . Arthritis   . Sleep apnea     . Last test was before 2007 , not sure name of test site.    Past Surgical History  Procedure Date  . Colonoscopy 2011  . Hiatal hernia repair     x2  . Eye surgery     Cataract 2013  . Knee arthroscopy     bil  . Patella fracture surgery     Family History  Problem Relation Age of Onset  . Coronary artery disease      Social History:  reports that he quit smoking about 32 years ago. He does not have any smokeless tobacco history on file. He reports that he does not drink alcohol or use illicit drugs.  Allergies: No Known Allergies  Medications: I have reviewed the patient's current medications.  Results for orders placed during the hospital encounter  of 12/28/12 (from the past 48 hour(s))  URINALYSIS, ROUTINE W REFLEX MICROSCOPIC     Status: Abnormal   Collection Time   12/28/12  1:10 AM      Component Value Range Comment   Color, Urine AMBER (*) YELLOW BIOCHEMICALS MAY BE AFFECTED BY COLOR   APPearance CLOUDY (*) CLEAR    Specific Gravity, Urine 1.024  1.005 - 1.030    pH 6.0  5.0 - 8.0    Glucose, UA NEGATIVE  NEGATIVE mg/dL    Hgb urine dipstick LARGE (*) NEGATIVE    Bilirubin Urine SMALL (*) NEGATIVE    Ketones, ur 40 (*) NEGATIVE mg/dL    Protein, ur >782 (*) NEGATIVE mg/dL    Urobilinogen, UA 2.0 (*) 0.0 - 1.0 mg/dL    Nitrite NEGATIVE  NEGATIVE    Leukocytes, UA SMALL (*) NEGATIVE   URINE MICROSCOPIC-ADD ON     Status: Abnormal   Collection Time   12/28/12  1:10 AM      Component Value Range Comment   Squamous Epithelial / LPF RARE  RARE    WBC, UA 21-50  <3 WBC/hpf    RBC / HPF 11-20  <3 RBC/hpf    Bacteria, UA FEW (*) RARE    Casts HYALINE CASTS (*) NEGATIVE    Urine-Other MUCOUS PRESENT     URINE CULTURE     Status: Normal (Preliminary result)   Collection Time  12/28/12  1:10 AM      Component Value Range Comment   Specimen Description URINE, CLEAN CATCH      Special Requests CX ADDED AT 0138 ON [id number]      Culture  Setup Time 12/28/2012 11:16      Colony Count >=100,000 COLONIES/ML      Culture ENTEROCOCCUS SPECIES      Report Status PENDING     GLUCOSE, CAPILLARY     Status: Abnormal   Collection Time   12/28/12  1:30 AM      Component Value Range Comment   Glucose-Capillary 146 (*) 70 - 99 mg/dL   CULTURE, BLOOD (ROUTINE X 2)     Status: Normal (Preliminary result)   Collection Time   12/28/12  1:35 AM      Component Value Range Comment   Specimen Description BLOOD RIGHT ARM      Special Requests BOTTLES DRAWN AEROBIC AND ANAEROBIC 3CC EACH      Culture  Setup Time 12/28/2012 11:19      Culture        Value:        BLOOD CULTURE RECEIVED NO GROWTH TO DATE CULTURE WILL BE HELD FOR 5 DAYS BEFORE ISSUING A  FINAL NEGATIVE REPORT   Report Status PENDING     COMPREHENSIVE METABOLIC PANEL     Status: Abnormal   Collection Time   12/28/12  1:51 AM      Component Value Range Comment   Sodium 132 (*) 135 - 145 mEq/L    Potassium 3.6  3.5 - 5.1 mEq/L    Chloride 98  96 - 112 mEq/L    CO2 22  19 - 32 mEq/L    Glucose, Bld 147 (*) 70 - 99 mg/dL    BUN 12  6 - 23 mg/dL    Creatinine, Ser 0.45  0.50 - 1.35 mg/dL    Calcium 9.1  8.4 - 40.9 mg/dL    Total Protein 9.0 (*) 6.0 - 8.3 g/dL    Albumin 2.4 (*) 3.5 - 5.2 g/dL    AST 13  0 - 37 U/L    ALT 8  0 - 53 U/L    Alkaline Phosphatase 78  39 - 117 U/L    Total Bilirubin 0.7  0.3 - 1.2 mg/dL    GFR calc non Af Amer >90  >90 mL/min    GFR calc Af Amer >90  >90 mL/min   CBC WITH DIFFERENTIAL     Status: Abnormal   Collection Time   12/28/12  1:51 AM      Component Value Range Comment   WBC 8.1  4.0 - 10.5 K/uL    RBC 4.58  4.22 - 5.81 MIL/uL    Hemoglobin 10.8 (*) 13.0 - 17.0 g/dL    HCT 81.1 (*) 91.4 - 52.0 %    MCV 76.4 (*) 78.0 - 100.0 fL    MCH 23.6 (*) 26.0 - 34.0 pg    MCHC 30.9  30.0 - 36.0 g/dL    RDW 78.2 (*) 95.6 - 15.5 %    Platelets 302  150 - 400 K/uL    Neutrophils Relative 79 (*) 43 - 77 %    Neutro Abs 6.4  1.7 - 7.7 K/uL    Lymphocytes Relative 11 (*) 12 - 46 %    Lymphs Abs 0.9  0.7 - 4.0 K/uL    Monocytes Relative 10  3 - 12 %    Monocytes Absolute  0.8  0.1 - 1.0 K/uL    Eosinophils Relative 0  0 - 5 %    Eosinophils Absolute 0.0  0.0 - 0.7 K/uL    Basophils Relative 0  0 - 1 %    Basophils Absolute 0.0  0.0 - 0.1 K/uL   SALICYLATE LEVEL     Status: Abnormal   Collection Time   12/28/12  1:51 AM      Component Value Range Comment   Salicylate Lvl <2.0 (*) 2.8 - 20.0 mg/dL   ACETAMINOPHEN LEVEL     Status: Normal   Collection Time   12/28/12  1:51 AM      Component Value Range Comment   Acetaminophen (Tylenol), Serum <15.0  10 - 30 ug/mL   POCT I-STAT 3, BLOOD GAS (G3P V)     Status: Abnormal   Collection Time   12/28/12   1:56 AM      Component Value Range Comment   pH, Ven 7.488 (*) 7.250 - 7.300    pCO2, Ven 30.1 (*) 45.0 - 50.0 mmHg    pO2, Ven 41.0  30.0 - 45.0 mmHg    Bicarbonate 22.9  20.0 - 24.0 mEq/L    TCO2 24  0 - 100 mmol/L    O2 Saturation 82.0      Sample type VENOUS     CG4 I-STAT (LACTIC ACID)     Status: Normal   Collection Time   12/28/12  3:22 AM      Component Value Range Comment   Lactic Acid, Venous 2.17  0.5 - 2.2 mmol/L   GLUCOSE, CAPILLARY     Status: Abnormal   Collection Time   12/28/12  7:29 AM      Component Value Range Comment   Glucose-Capillary 119 (*) 70 - 99 mg/dL   GLUCOSE, CAPILLARY     Status: Abnormal   Collection Time   12/28/12  8:52 AM      Component Value Range Comment   Glucose-Capillary 107 (*) 70 - 99 mg/dL   CBC     Status: Abnormal   Collection Time   12/28/12  9:31 AM      Component Value Range Comment   WBC 8.2  4.0 - 10.5 K/uL    RBC 4.34  4.22 - 5.81 MIL/uL    Hemoglobin 10.4 (*) 13.0 - 17.0 g/dL    HCT 45.4 (*) 09.8 - 52.0 %    MCV 77.0 (*) 78.0 - 100.0 fL    MCH 24.0 (*) 26.0 - 34.0 pg    MCHC 31.1  30.0 - 36.0 g/dL    RDW 11.9 (*) 14.7 - 15.5 %    Platelets 286  150 - 400 K/uL   CREATININE, SERUM     Status: Normal   Collection Time   12/28/12  9:31 AM      Component Value Range Comment   Creatinine, Ser 0.63  0.50 - 1.35 mg/dL    GFR calc non Af Amer >90  >90 mL/min    GFR calc Af Amer >90  >90 mL/min   GLUCOSE, CAPILLARY     Status: Abnormal   Collection Time   12/28/12 12:00 PM      Component Value Range Comment   Glucose-Capillary 115 (*) 70 - 99 mg/dL   GLUCOSE, CAPILLARY     Status: Abnormal   Collection Time   12/28/12  4:45 PM      Component Value Range Comment   Glucose-Capillary 127 (*) 70 -  99 mg/dL    Comment 1 Notify RN     GLUCOSE, CAPILLARY     Status: Abnormal   Collection Time   12/28/12  9:34 PM      Component Value Range Comment   Glucose-Capillary 164 (*) 70 - 99 mg/dL   GLUCOSE, CAPILLARY     Status: Abnormal    Collection Time   12/29/12  6:53 AM      Component Value Range Comment   Glucose-Capillary 118 (*) 70 - 99 mg/dL    Comment 1 Documented in Chart      Comment 2 Notify RN     BASIC METABOLIC PANEL     Status: Abnormal   Collection Time   12/29/12  7:49 AM      Component Value Range Comment   Sodium 136  135 - 145 mEq/L    Potassium 3.6  3.5 - 5.1 mEq/L    Chloride 101  96 - 112 mEq/L    CO2 24  19 - 32 mEq/L    Glucose, Bld 126 (*) 70 - 99 mg/dL    BUN 15  6 - 23 mg/dL    Creatinine, Ser 4.78  0.50 - 1.35 mg/dL    Calcium 8.9  8.4 - 29.5 mg/dL    GFR calc non Af Amer >90  >90 mL/min    GFR calc Af Amer >90  >90 mL/min   CBC     Status: Abnormal   Collection Time   12/29/12  7:49 AM      Component Value Range Comment   WBC 6.8  4.0 - 10.5 K/uL    RBC 4.03 (*) 4.22 - 5.81 MIL/uL    Hemoglobin 9.6 (*) 13.0 - 17.0 g/dL    HCT 62.1 (*) 30.8 - 52.0 %    MCV 78.7  78.0 - 100.0 fL    MCH 23.8 (*) 26.0 - 34.0 pg    MCHC 30.3  30.0 - 36.0 g/dL    RDW 65.7 (*) 84.6 - 15.5 %    Platelets 295  150 - 400 K/uL   GLUCOSE, CAPILLARY     Status: Abnormal   Collection Time   12/29/12 11:46 AM      Component Value Range Comment   Glucose-Capillary 128 (*) 70 - 99 mg/dL    Comment 1 Notify RN      Comment 2 Documented in Chart     GLUCOSE, CAPILLARY     Status: Abnormal   Collection Time   12/29/12  5:07 PM      Component Value Range Comment   Glucose-Capillary 142 (*) 70 - 99 mg/dL    Comment 1 Notify RN      Comment 2 Documented in Chart       Ct Abdomen Pelvis W Contrast  12/28/2012  *RADIOLOGY REPORT*  Clinical Data: Lower abdominal pain, back pain, prior lumbar surgery L3-L5 on 08/30/2012  CT ABDOMEN AND PELVIS WITH CONTRAST  Technique:  Multidetector CT imaging of the abdomen and pelvis was performed following the standard protocol during bolus administration of intravenous contrast. Sagittal and coronal MPR images reconstructed from axial data set.  Contrast: OMNIPAQUE IOHEXOL 300  MG/ML  SOLN Dilute oral contrast.  Comparison: None Correlation:  MRI lumbar spine 02/29/2012  Findings: Bibasilar atelectasis greater on the right. Tiny left pleural effusion. Moderate sized hiatal hernia. Liver, spleen, pancreas, and adrenal glands normal appearance. Symmetric nephrograms with bilateral nonobstructing renal calculi largest 9 mm diameter right kidney. Small ventral  hernia containing fat. Additional ventral hernia versus prior lax ventral hernia repair, containing nonobstructed bowel loop.  Retroaortic left renal vein. Anterior displacement of aorta and IVC from the spine by abnormal prevertebral soft tissue. This could represent a hematoma from prior surgery, developing fibrosis, or infection. No discrete enhancing collections to confirm abscess is identified.  Prior L3-L5 posterior fusion with disc prostheses at these levels. Mild lucency surrounding the left pedicle screw at L5. Endplates at inferior L4/superior L5 are slightly indistinct, could related to prior surgery but infection is not excluded. Diverticulosis of the descending and sigmoid colon without definite evidence of acute diverticulitis. No mass, adenopathy, free fluid or additional inflammatory process.  IMPRESSION: Nonobstructing renal calculi bilaterally. Ventral hernias. Descending sigmoid colonic diverticulosis without evidence of acute diverticulitis. Interval L3-L5 fusion with abnormal soft tissue prevertebral at the ankle levels, question related to hemorrhage or fibrosis though infection is not completely excluded. Indistinct endplates at L4-L5 as well as lucency surrounding the left pedicle screw at L5, cannot exclude infection.   Original Report Authenticated By: Ulyses Southward, M.D.     Review of Systems  Constitutional: Positive for fever, malaise/fatigue and diaphoresis.  HENT: Negative.   Eyes: Negative.   Respiratory: Negative.   Cardiovascular: Negative.   Gastrointestinal: Negative.   Genitourinary: Positive  for dysuria.  Musculoskeletal: Positive for back pain.  Skin: Negative.   Neurological: Positive for weakness.  Endo/Heme/Allergies: Negative.   Psychiatric/Behavioral: Negative.    Blood pressure 124/68, pulse 76, temperature 98.5 F (36.9 C), temperature source Oral, resp. rate 20, height 6\' 2"  (1.88 m), weight 113.399 kg (250 lb), SpO2 98.00%. Physical Exam  Constitutional: He appears well-developed and well-nourished.  Musculoskeletal:       Well healed incision on his back. Is minimal tenderness to palpation along the area of the skin.  Neurological:       Motor function demonstrates good function in iliopsoas quadricep tibialis anterior and gastrocs rated at least 4-5 however complicated by substantial back pain when motion is attempted with substantial resistance.  Skin: Skin is warm and dry.  Psychiatric: He has a normal mood and affect. His behavior is normal. Judgment and thought content normal.    Assessment/Plan: Possible deep space infection with either osteomyelitis or discitis status post arthrodesis L3-L5. Evidence for hardware loosening I. CT scan.  Plan: Patient will have a sedimentation rate and C-reactive protein added to his blood draws at this time. If these are elevated consideration of appropriate antibiotic treatment and/or surgical debridement will begin. I'll discuss this with Dr. Mikal Plane.  Aariona Momon J 12/29/2012, 7:33 PM

## 2012-12-29 NOTE — Progress Notes (Signed)
Subjective: 67 year old male who developed lower abdominal/groin pain for several days prior to admission.  Developed high fever to about 103 and became confused.  Was brought to the emergency room and found to have a possible UTI though urinalysis is not convincing. He does have a soft tissue mass prevertebral he in the lower lumbar area which may be a focus of infection.  Several months ago he had a rather extensive lumbar surgery per Dr. Franky Macho.  He has never had prostatitis and rectal exam today is benign.  He does have diabetes and hypertension and GERD.  Feeling much better with antibiotic therapy.     Objective: Weight change:   Intake/Output Summary (Last 24 hours) at 12/29/12 1139 Last data filed at 12/29/12 0933  Gross per 24 hour  Intake   1739 ml  Output   1320 ml  Net    419 ml   Filed Vitals:   12/28/12 1800 12/28/12 2200 12/29/12 0214 12/29/12 0601  BP: 134/72 122/62 123/68 110/66  Pulse: 87 88 82 76  Temp: 98.7 F (37.1 C) 100.3 F (37.9 C) 98.3 F (36.8 C) 98 F (36.7 C)  TempSrc: Oral Oral Oral Oral  Resp: 20 20 20 19   Height:      Weight:      SpO2: 98% 95% 97% 96%    General Appearance: Alert, cooperative, no distress, appears stated age Head: Normocephalic, without obvious abnormality, atraumatic Neck: Supple, symmetrical Lungs: Clear to auscultation bilaterally, respirations unlabored Heart: Regular rate and rhythm, S1 and S2 normal, no murmur, rub or gallop Abdomen: Soft, non-tender, bowel sounds active all four quadrants, no masses, no organomegaly Rectal: Prostate is nontender and non-boggy and not enlarged to palpation digitally  Extremities: Extremities normal, atraumatic, no cyanosis or edema Pulses: 2+ and symmetric all extremities Skin: Skin color, texture, turgor normal, no rashes or lesions Neuro: CNII-XII intact. Normal strength, sensation and reflexes throughout   Lab Results:  Basename 12/29/12 0749 12/28/12 0931 12/28/12 0151  NA 136  -- 132*  K 3.6 -- 3.6  CL 101 -- 98  CO2 24 -- 22  GLUCOSE 126* -- 147*  BUN 15 -- 12  CREATININE 0.75 0.63 --  CALCIUM 8.9 -- 9.1  MG -- -- --  PHOS -- -- --    Basename 12/28/12 0151  AST 13  ALT 8  ALKPHOS 78  BILITOT 0.7  PROT 9.0*  ALBUMIN 2.4*   No results found for this basename: LIPASE:2,AMYLASE:2 in the last 72 hours  Basename 12/29/12 0749 12/28/12 0931 12/28/12 0151  WBC 6.8 8.2 --  NEUTROABS -- -- 6.4  HGB 9.6* 10.4* --  HCT 31.7* 33.4* --  MCV 78.7 77.0* --  PLT 295 286 --   No results found for this basename: CKTOTAL:3,CKMB:3,CKMBINDEX:3,TROPONINI:3 in the last 72 hours No components found with this basename: POCBNP:3 No results found for this basename: DDIMER:2 in the last 72 hours No results found for this basename: HGBA1C:2 in the last 72 hours No results found for this basename: CHOL:2,HDL:2,LDLCALC:2,TRIG:2,CHOLHDL:2,LDLDIRECT:2 in the last 72 hours No results found for this basename: TSH,T4TOTAL,FREET3,T3FREE,THYROIDAB in the last 72 hours No results found for this basename: VITAMINB12:2,FOLATE:2,FERRITIN:2,TIBC:2,IRON:2,RETICCTPCT:2 in the last 72 hours  Studies/Results: Ct Abdomen Pelvis W Contrast  12/28/2012  *RADIOLOGY REPORT*  Clinical Data: Lower abdominal pain, back pain, prior lumbar surgery L3-L5 on 08/30/2012  CT ABDOMEN AND PELVIS WITH CONTRAST  Technique:  Multidetector CT imaging of the abdomen and pelvis was performed following the standard protocol during bolus  administration of intravenous contrast. Sagittal and coronal MPR images reconstructed from axial data set.  Contrast: OMNIPAQUE IOHEXOL 300 MG/ML  SOLN Dilute oral contrast.  Comparison: None Correlation:  MRI lumbar spine 02/29/2012  Findings: Bibasilar atelectasis greater on the right. Tiny left pleural effusion. Moderate sized hiatal hernia. Liver, spleen, pancreas, and adrenal glands normal appearance. Symmetric nephrograms with bilateral nonobstructing renal calculi largest  9 mm diameter right kidney. Small ventral hernia containing fat. Additional ventral hernia versus prior lax ventral hernia repair, containing nonobstructed bowel loop.  Retroaortic left renal vein. Anterior displacement of aorta and IVC from the spine by abnormal prevertebral soft tissue. This could represent a hematoma from prior surgery, developing fibrosis, or infection. No discrete enhancing collections to confirm abscess is identified.  Prior L3-L5 posterior fusion with disc prostheses at these levels. Mild lucency surrounding the left pedicle screw at L5. Endplates at inferior L4/superior L5 are slightly indistinct, could related to prior surgery but infection is not excluded. Diverticulosis of the descending and sigmoid colon without definite evidence of acute diverticulitis. No mass, adenopathy, free fluid or additional inflammatory process.  IMPRESSION: Nonobstructing renal calculi bilaterally. Ventral hernias. Descending sigmoid colonic diverticulosis without evidence of acute diverticulitis. Interval L3-L5 fusion with abnormal soft tissue prevertebral at the ankle levels, question related to hemorrhage or fibrosis though infection is not completely excluded. Indistinct endplates at L4-L5 as well as lucency surrounding the left pedicle screw at L5, cannot exclude infection.   Original Report Authenticated By: Ulyses Southward, M.D.    Medications: Scheduled Meds:   . amLODipine  5 mg Oral Daily  . aspirin  325 mg Oral Daily  . cefTRIAXone (ROCEPHIN)  IV  1 g Intravenous Q24H  . enoxaparin (LOVENOX) injection  40 mg Subcutaneous Q24H  . furosemide  40 mg Oral Daily  . insulin aspart  0-15 Units Subcutaneous TID WC  . insulin aspart  0-5 Units Subcutaneous QHS  . insulin glargine  80 Units Subcutaneous QHS  . losartan  100 mg Oral Daily  . pantoprazole  40 mg Oral Daily  . potassium chloride SA  20 mEq Oral BID  . simvastatin  20 mg Oral QPM   Continuous Infusions:  PRN Meds:.acetaminophen,  acetaminophen, alum & mag hydroxide-simeth, ondansetron (ZOFRAN) IV, ondansetron, oxyCODONE-acetaminophen, sodium chloride  Assessment/Plan:  Present on Admission:   Acute encephalopathy - resolved, likely secondary to treatment of infection Urinary tract infection - urinalysis revealed a few bacteria.  May not be a true UTI.  Blood cultures x2 are pending and urine culture is pending.  Continue Rocephin  . DIABETES MELLITUS - aware - ADA diet and sliding scale insulin  . HYPERLIPIDEMIA - aware  . OBESITY, MORBID - history of  . HYPERTENSION - history of  . SLEEP APNEA - continue CPAP - CPAP machine family to use their own   Lumbar interbody arthrodesis L3/L5 with abnormal soft tissue prevertebrally actually lifting the aorta and IVC.   Dr. Norlene Campbell spoke to the neurosurgeon on call Dr. Lovell Sheehan, who states this is likely an incidental finding. Neurosurgeon aware     and   Dr. Franky Macho is to see patient in a.m. I will call Dr. Lovell Sheehan to review the films and make sure   LOS: 1 day   Kevin [name] 12/29/2012, 11:39 AM

## 2012-12-30 LAB — BASIC METABOLIC PANEL
BUN: 11 mg/dL (ref 6–23)
CO2: 22 mEq/L (ref 19–32)
Calcium: 8.8 mg/dL (ref 8.4–10.5)
Chloride: 99 mEq/L (ref 96–112)
Creatinine, Ser: 0.63 mg/dL (ref 0.50–1.35)
GFR calc Af Amer: >90 mL/min (ref >90)
GFR calc non Af Amer: >90 mL/min (ref >90)
Glucose, Bld: 126 mg/dL — ABNORMAL HIGH (ref 70–99)
Potassium: 3.5 mEq/L (ref 3.5–5.1)
Sodium: 134 mEq/L — ABNORMAL LOW (ref 135–145)

## 2012-12-30 LAB — GLUCOSE, CAPILLARY
Glucose-Capillary: 103 mg/dL — ABNORMAL HIGH (ref 70–99)
Glucose-Capillary: 119 mg/dL — ABNORMAL HIGH (ref 70–99)
Glucose-Capillary: 136 mg/dL — ABNORMAL HIGH (ref 70–99)
Glucose-Capillary: 137 mg/dL — ABNORMAL HIGH (ref 70–99)
Glucose-Capillary: 179 mg/dL — ABNORMAL HIGH (ref 70–99)

## 2012-12-30 LAB — URINE CULTURE: Colony Count: >=100000

## 2012-12-30 LAB — CBC WITH DIFFERENTIAL/PLATELET
Basophils Absolute: 0 10*3/uL (ref 0.0–0.1)
Basophils Relative: 1 % (ref 0–1)
Eosinophils Absolute: 0.3 10*3/uL (ref 0.0–0.7)
Eosinophils Relative: 5 % (ref 0–5)
HCT: 30 % — ABNORMAL LOW (ref 39.0–52.0)
Hemoglobin: 9.3 g/dL — ABNORMAL LOW (ref 13.0–17.0)
Lymphocytes Relative: 21 % (ref 12–46)
Lymphs Abs: 1.4 10*3/uL (ref 0.7–4.0)
MCH: 24.2 pg — ABNORMAL LOW (ref 26.0–34.0)
MCHC: 31 g/dL (ref 30.0–36.0)
MCV: 77.9 fL — ABNORMAL LOW (ref 78.0–100.0)
Monocytes Absolute: 0.9 10*3/uL (ref 0.1–1.0)
Monocytes Relative: 14 % — ABNORMAL HIGH (ref 3–12)
Neutro Abs: 4.1 10*3/uL (ref 1.7–7.7)
Neutrophils Relative %: 60 % (ref 43–77)
Platelets: 300 10*3/uL (ref 150–400)
RBC: 3.85 MIL/uL — ABNORMAL LOW (ref 4.22–5.81)
RDW: 17.5 % — ABNORMAL HIGH (ref 11.5–15.5)
WBC: 6.8 10*3/uL (ref 4.0–10.5)

## 2012-12-30 LAB — C-REACTIVE PROTEIN: CRP: 13.1 mg/dL — ABNORMAL HIGH (ref <0.60)

## 2012-12-30 MED ORDER — AMOXICILLIN 500 MG PO CAPS
500.0000 mg | ORAL_CAPSULE | Freq: Three times a day (TID) | ORAL | Status: DC
  Administered 2012-12-30 – 2013-01-05 (×17): 500 mg via ORAL
  Filled 2012-12-30 (×22): qty 1

## 2012-12-30 NOTE — Progress Notes (Signed)
Patient ID: Kevin [name], male   DOB: 1946/02/02, 66 y.o.   MRN: [id number] Alert and oriented x 4 Sed rate is >100 Will speak with medical service, but do believe the l5 screw should be removed. Fairly clear that there is osteomyelitis at L5.  Do not believe MRI needed due to appearance on CT and sed rate combined. The area needs to be explored when hardware removed.

## 2012-12-30 NOTE — Progress Notes (Signed)
Subjective: Comfortable when at rest  Objective: Vital signs in last 24 hours: Temp:  [98.2 F (36.8 C)-98.5 F (36.9 C)] 98.3 F (36.8 C) (01/06 0551) Pulse Rate:  [74-78] 76  (01/06 0551) Resp:  [18-20] 20  (01/06 0551) BP: (112-136)/(61-68) 112/61 mmHg (01/06 0551) SpO2:  [97 %-100 %] 98 % (01/06 0551) Weight change:  Last BM Date: 12/27/12  Intake/Output from previous day: 01/05 0701 - 01/06 0700 In: 110 [P.O.:60; IV Piggyback:50] Out: 350 [Urine:350] Intake/Output this shift:    General appearance: alert and cooperative Resp: clear to auscultation bilaterally Cardio: regular rate and rhythm, S1, S2 normal, no murmur, click, rub or gallop GI: soft, non-tender; bowel sounds normal; no masses,  no organomegaly  Lab Results:  [facility] 12/29/12 0749 12/28/12 0931  WBC 6.8 8.2  HGB 9.6* 10.4*  HCT 31.7* 33.4*  PLT 295 286   BMET  Basename 12/29/12 0749 12/28/12 0931 12/28/12 0151  NA 136 -- 132*  K 3.6 -- 3.6  CL 101 -- 98  CO2 24 -- 22  GLUCOSE 126* -- 147*  BUN 15 -- 12  CREATININE 0.75 0.63 --  CALCIUM 8.9 -- 9.1    Studies/Results: No results found.  Medications: I have reviewed the patient's current medications.  Assessment/Plan: Present on Admission:  Acute encephalopathy - resolved, likely secondary fever  Urinary tract infection - enterococcus.. Blood cultures x2 NG so far. Continue Rocephin and await sensitivities . DIABETES MELLITUS -controlled - ADA diet and sliding scale insulin  . HYPERTENSION - controlled  . SLEEP APNEA - continue CPAP - CPAP machine family to use their own  Lumbar interbody arthrodesis L3/L5 with abnormal soft tissue prevertebrally, appreciate NS input.     ESR 130.  Possible MRI today, await further input from neurosurgery. Anemia,microcytic,  check iron studies, ferritin, may be chronic disease     LOS: 2 days   Andree Golphin JOSEPH    12/30/2012, 7:38 AM

## 2012-12-31 ENCOUNTER — Other Ambulatory Visit: Payer: Self-pay | Admitting: Neurosurgery

## 2012-12-31 DIAGNOSIS — E119 Type 2 diabetes mellitus without complications: Secondary | ICD-10-CM

## 2012-12-31 DIAGNOSIS — M869 Osteomyelitis, unspecified: Secondary | ICD-10-CM

## 2012-12-31 DIAGNOSIS — N390 Urinary tract infection, site not specified: Secondary | ICD-10-CM

## 2012-12-31 LAB — CBC WITH DIFFERENTIAL/PLATELET
Basophils Absolute: 0.1 10*3/uL (ref 0.0–0.1)
Basophils Relative: 1 % (ref 0–1)
Eosinophils Absolute: 0.3 10*3/uL (ref 0.0–0.7)
Eosinophils Relative: 4 % (ref 0–5)
HCT: 30.1 % — ABNORMAL LOW (ref 39.0–52.0)
Hemoglobin: 9.4 g/dL — ABNORMAL LOW (ref 13.0–17.0)
Lymphocytes Relative: 26 % (ref 12–46)
Lymphs Abs: 2 10*3/uL (ref 0.7–4.0)
MCH: 24.2 pg — ABNORMAL LOW (ref 26.0–34.0)
MCHC: 31.2 g/dL (ref 30.0–36.0)
MCV: 77.6 fL — ABNORMAL LOW (ref 78.0–100.0)
Monocytes Absolute: 0.9 10*3/uL (ref 0.1–1.0)
Monocytes Relative: 12 % (ref 3–12)
Neutro Abs: 4.4 10*3/uL (ref 1.7–7.7)
Neutrophils Relative %: 57 % (ref 43–77)
Platelets: 323 10*3/uL (ref 150–400)
RBC: 3.88 MIL/uL — ABNORMAL LOW (ref 4.22–5.81)
RDW: 17.7 % — ABNORMAL HIGH (ref 11.5–15.5)
WBC: 7.6 10*3/uL (ref 4.0–10.5)

## 2012-12-31 LAB — PROTEIN ELECTROPHORESIS, SERUM
Albumin ELP: 31.7 % — ABNORMAL LOW (ref 55.8–66.1)
Alpha-1-Globulin: 7.9 % — ABNORMAL HIGH (ref 2.9–4.9)
Alpha-2-Globulin: 17.5 % — ABNORMAL HIGH (ref 7.1–11.8)
Beta 2: 9.5 % — ABNORMAL HIGH (ref 3.2–6.5)
Beta Globulin: 6.6 % (ref 4.7–7.2)
Gamma Globulin: 26.8 % — ABNORMAL HIGH (ref 11.1–18.8)
M-Spike, %: 0.6 g/dL
Total Protein ELP: 7.6 g/dL (ref 6.0–8.3)

## 2012-12-31 LAB — IRON AND TIBC
Iron: 11 ug/dL — ABNORMAL LOW (ref 42–135)
Saturation Ratios: 5 % — ABNORMAL LOW (ref 20–55)
TIBC: 222 ug/dL (ref 215–435)
UIBC: 211 ug/dL (ref 125–400)

## 2012-12-31 LAB — BASIC METABOLIC PANEL
BUN: 9 mg/dL (ref 6–23)
CO2: 26 mEq/L (ref 19–32)
Calcium: 9.1 mg/dL (ref 8.4–10.5)
Chloride: 99 mEq/L (ref 96–112)
Creatinine, Ser: 0.61 mg/dL (ref 0.50–1.35)
GFR calc Af Amer: >90 mL/min (ref >90)
GFR calc non Af Amer: >90 mL/min (ref >90)
Glucose, Bld: 85 mg/dL (ref 70–99)
Potassium: 3.7 mEq/L (ref 3.5–5.1)
Sodium: 136 mEq/L (ref 135–145)

## 2012-12-31 LAB — GLUCOSE, CAPILLARY
Glucose-Capillary: 86 mg/dL (ref 70–99)
Glucose-Capillary: 125 mg/dL — ABNORMAL HIGH (ref 70–99)
Glucose-Capillary: 143 mg/dL — ABNORMAL HIGH (ref 70–99)

## 2012-12-31 LAB — FERRITIN: Ferritin: 136 ng/mL (ref 22–322)

## 2012-12-31 MED ORDER — WHITE PETROLATUM GEL
Status: AC
  Administered 2012-12-31: 0.2
  Filled 2012-12-31: qty 5

## 2012-12-31 MED ORDER — OXYCODONE-ACETAMINOPHEN 5-325 MG PO TABS
2.0000 | ORAL_TABLET | ORAL | Status: DC | PRN
  Administered 2012-12-31 – 2013-01-07 (×25): 2 via ORAL
  Filled 2012-12-31 (×25): qty 2

## 2012-12-31 NOTE — Consult Note (Signed)
INFECTIOUS DISEASE CONSULT NOTE  Date of Admission:  12/28/2012  Date of Consult:  12/31/2012  Reason for Consult: Osteomyelitis Referring Physician: Roseanne Reno  Impression/Recommendation Osteomyelitis, ost-op wound infection UTI DM2 Would: 1. not change his antibiotics until postoperative  2. await wound cultures from his surgery planned for tomorrow  3. discussed with patient regarding potential long-term antibiotics (6 weeks)  Comment- I discussed with the patient and his wife regarding potential etiologies of the suspected infection in his back. Certainly patients can get seeding from urinary tract infections. He also is at risk for routine procedure related complications. He has somewhat of an increased risk due to his diabetes. I spent a fair amount of time explaining this to the patient and his wife. After he has his surgery would start him back on the ceftriaxone, start vancomycin, and add rifampin as well. Rifampin has the added benefit of treating sessile organisms related to prosthetic material as well as good penetration into bile films.  Thank you so much for this interesting consult,   Kevin [name] [phone]  Kevin [name] is an 67 y.o. male.  HPI: 67 yo Mw with hx DM2 (since 1997) and  spinal stenosis. On 08-30-12 under going decompression, screws, cage and autograft L3-5. He did well post-op and was able to be d/c home on 09-04-12.  He returns on 12-28-12 with back pain and confusion. In ED he was found to have lower abd/supra-pubic pain,  temp 102.9. He was found to have an UCx  growing Enterococcus (sens Amp, Vanco). He was started on ceftriaxone. He was also found to have a soft tissue mass on his lower lumbar area. He underwent CT scan of the abdomen on 12/28/2012 showing abnormal soft tissue prevertebral area as well as indistinct endplates at L4-5 with lucency around the pedicle screw at L5. He has been afebrile since 12-28-12.   Past Medical History  Diagnosis Date  .  Hypertension   . Diabetes mellitus   . GERD (gastroesophageal reflux disease)   . H/O hiatal hernia   . Arthritis   . Sleep apnea     . Last test was before 2007 , not sure name of test site.    Past Surgical History  Procedure Date  . Colonoscopy 2011  . Hiatal hernia repair     x2  . Eye surgery     Cataract 2013  . Knee arthroscopy     bil  . Patella fracture surgery      No Known Allergies  Medications:  Scheduled:   . amLODipine  5 mg Oral Daily  . amoxicillin  500 mg Oral Q8H  . aspirin  325 mg Oral Daily  . enoxaparin (LOVENOX) injection  40 mg Subcutaneous Q24H  . furosemide  40 mg Oral Daily  . insulin aspart  0-15 Units Subcutaneous TID WC  . insulin aspart  0-5 Units Subcutaneous QHS  . insulin glargine  80 Units Subcutaneous QHS  . losartan  100 mg Oral Daily  . pantoprazole  40 mg Oral Daily  . potassium chloride SA  20 mEq Oral BID  . simvastatin  20 mg Oral QPM    Total days of antibiotics 4 (ceftriaxone ---> amoxil)          Social History:  reports that he quit smoking about 32 years ago. He does not have any smokeless tobacco history on file. He reports that he does not drink alcohol or use illicit drugs.  Family History  Problem Relation  Age of Onset  . Coronary artery disease      General ROS: States that he had persistent pain after surgery. He has had no dysuria or cloudiness of his urine. He has had normal bowel movements. No headaches. He has lost 60 pounds over the last roughly 4 years. This was unintentional. He has had loss of appetite. He did not have any change in his lower extremity numbness from his baseline diabetic neuropathy. His wound healed well after his surgery. There was no wound drainage after his surgery. Please see history of present illness.  Blood pressure 118/54, pulse 76, temperature 98.7 F (37.1 C), temperature source Oral, resp. rate 18, height 6\' 2"  (1.88 m), weight 113.399 kg (250 lb), SpO2 98.00%. General  appearance: alert, cooperative and mild distress Eyes: negative findings: pupils equal, round, reactive to light and accomodation Throat: lips, mucosa, and tongue normal; teeth and gums normal Neck: no carotid bruit and supple, symmetrical, trachea midline Lungs: clear to auscultation bilaterally Heart: regular rate and rhythm Abdomen: normal findings: bowel sounds normal and soft, non-tender Extremities: edema no diabetic foot lesions and his back wound is well healed, there is softness but no frnak fluid palplated in the wound bed.     Results for orders placed during the hospital encounter of 12/28/12 (from the past 48 hour(s))  GLUCOSE, CAPILLARY     Status: Abnormal   Collection Time   12/29/12  5:07 PM      Component Value Range Comment   Glucose-Capillary 142 (*) 70 - 99 mg/dL    Comment 1 Notify RN      Comment 2 Documented in Chart     C-REACTIVE PROTEIN     Status: Abnormal   Collection Time   12/29/12  8:20 PM      Component Value Range Comment   CRP 13.1 (*) <0.60 mg/dL   SEDIMENTATION RATE     Status: Abnormal   Collection Time   12/29/12  8:20 PM      Component Value Range Comment   Sed Rate 130 (*) 0 - 16 mm/hr   GLUCOSE, CAPILLARY     Status: Normal   Collection Time   12/29/12 10:09 PM      Component Value Range Comment   Glucose-Capillary 94  70 - 99 mg/dL   GLUCOSE, CAPILLARY     Status: Abnormal   Collection Time   12/30/12 12:36 AM      Component Value Range Comment   Glucose-Capillary 119 (*) 70 - 99 mg/dL   GLUCOSE, CAPILLARY     Status: Abnormal   Collection Time   12/30/12  6:53 AM      Component Value Range Comment   Glucose-Capillary 103 (*) 70 - 99 mg/dL   BASIC METABOLIC PANEL     Status: Abnormal   Collection Time   12/30/12  8:00 AM      Component Value Range Comment   Sodium 134 (*) 135 - 145 mEq/L    Potassium 3.5  3.5 - 5.1 mEq/L    Chloride 99  96 - 112 mEq/L    CO2 22  19 - 32 mEq/L    Glucose, Bld 126 (*) 70 - 99 mg/dL    BUN 11  6 - 23  mg/dL    Creatinine, Ser 1.47  0.50 - 1.35 mg/dL    Calcium 8.8  8.4 - 82.9 mg/dL    GFR calc non Af Amer >90  >90 mL/min    GFR calc  Af Amer >90  >90 mL/min   CBC WITH DIFFERENTIAL     Status: Abnormal   Collection Time   12/30/12  8:00 AM      Component Value Range Comment   WBC 6.8  4.0 - 10.5 K/uL    RBC 3.85 (*) 4.22 - 5.81 MIL/uL    Hemoglobin 9.3 (*) 13.0 - 17.0 g/dL    HCT 16.1 (*) 09.6 - 52.0 %    MCV 77.9 (*) 78.0 - 100.0 fL    MCH 24.2 (*) 26.0 - 34.0 pg    MCHC 31.0  30.0 - 36.0 g/dL    RDW 04.5 (*) 40.9 - 15.5 %    Platelets 300  150 - 400 K/uL    Neutrophils Relative 60  43 - 77 %    Neutro Abs 4.1  1.7 - 7.7 K/uL    Lymphocytes Relative 21  12 - 46 %    Lymphs Abs 1.4  0.7 - 4.0 K/uL    Monocytes Relative 14 (*) 3 - 12 %    Monocytes Absolute 0.9  0.1 - 1.0 K/uL    Eosinophils Relative 5  0 - 5 %    Eosinophils Absolute 0.3  0.0 - 0.7 K/uL    Basophils Relative 1  0 - 1 %    Basophils Absolute 0.0  0.0 - 0.1 K/uL   GLUCOSE, CAPILLARY     Status: Abnormal   Collection Time   12/30/12 11:45 AM      Component Value Range Comment   Glucose-Capillary 179 (*) 70 - 99 mg/dL    Comment 1 Notify RN      Comment 2 Documented in Chart     GLUCOSE, CAPILLARY     Status: Abnormal   Collection Time   12/30/12  4:52 PM      Component Value Range Comment   Glucose-Capillary 136 (*) 70 - 99 mg/dL    Comment 1 Notify RN      Comment 2 Documented in Chart     GLUCOSE, CAPILLARY     Status: Abnormal   Collection Time   12/30/12  9:55 PM      Component Value Range Comment   Glucose-Capillary 137 (*) 70 - 99 mg/dL    Comment 1 Notify RN      Comment 2 Documented in Chart     BASIC METABOLIC PANEL     Status: Normal   Collection Time   12/31/12  6:15 AM      Component Value Range Comment   Sodium 136  135 - 145 mEq/L    Potassium 3.7  3.5 - 5.1 mEq/L    Chloride 99  96 - 112 mEq/L    CO2 26  19 - 32 mEq/L    Glucose, Bld 85  70 - 99 mg/dL    BUN 9  6 - 23 mg/dL    Creatinine,  Ser 8.11  0.50 - 1.35 mg/dL    Calcium 9.1  8.4 - 91.4 mg/dL    GFR calc non Af Amer >90  >90 mL/min    GFR calc Af Amer >90  >90 mL/min   CBC WITH DIFFERENTIAL     Status: Abnormal   Collection Time   12/31/12  6:15 AM      Component Value Range Comment   WBC 7.6  4.0 - 10.5 K/uL    RBC 3.88 (*) 4.22 - 5.81 MIL/uL    Hemoglobin 9.4 (*) 13.0 - 17.0 g/dL  HCT 30.1 (*) 39.0 - 52.0 %    MCV 77.6 (*) 78.0 - 100.0 fL    MCH 24.2 (*) 26.0 - 34.0 pg    MCHC 31.2  30.0 - 36.0 g/dL    RDW 16.1 (*) 09.6 - 15.5 %    Platelets 323  150 - 400 K/uL    Neutrophils Relative 57  43 - 77 %    Neutro Abs 4.4  1.7 - 7.7 K/uL    Lymphocytes Relative 26  12 - 46 %    Lymphs Abs 2.0  0.7 - 4.0 K/uL    Monocytes Relative 12  3 - 12 %    Monocytes Absolute 0.9  0.1 - 1.0 K/uL    Eosinophils Relative 4  0 - 5 %    Eosinophils Absolute 0.3  0.0 - 0.7 K/uL    Basophils Relative 1  0 - 1 %    Basophils Absolute 0.1  0.0 - 0.1 K/uL   IRON AND TIBC     Status: Abnormal   Collection Time   12/31/12  6:15 AM      Component Value Range Comment   Iron 11 (*) 42 - 135 ug/dL    TIBC 045  409 - 811 ug/dL    Saturation Ratios 5 (*) 20 - 55 %    UIBC 211  125 - 400 ug/dL   FERRITIN     Status: Normal   Collection Time   12/31/12  6:15 AM      Component Value Range Comment   Ferritin 136  22 - 322 ng/mL   GLUCOSE, CAPILLARY     Status: Normal   Collection Time   12/31/12  7:32 AM      Component Value Range Comment   Glucose-Capillary 86  70 - 99 mg/dL    Comment 1 Documented in Chart      Comment 2 Notify RN     GLUCOSE, CAPILLARY     Status: Abnormal   Collection Time   12/31/12 12:22 PM      Component Value Range Comment   Glucose-Capillary 125 (*) 70 - 99 mg/dL       Component Value Date/Time   SDES BLOOD LEFT ARM 12/28/2012 0150   SPECREQUEST BOTTLES DRAWN AEROBIC AND ANAEROBIC 5CC EACH 12/28/2012 0150   CULT        BLOOD CULTURE RECEIVED NO GROWTH TO DATE CULTURE WILL BE HELD FOR 5 DAYS BEFORE ISSUING A  FINAL NEGATIVE REPORT 12/28/2012 0150   REPTSTATUS PENDING 12/28/2012 0150   No results found. Recent Results (from the past 240 hour(s))  URINE CULTURE     Status: Normal   Collection Time   12/28/12  1:10 AM      Component Value Range Status Comment   Specimen Description URINE, CLEAN CATCH   Final    Special Requests CX ADDED AT 0138 ON [id number]   Final    Culture  Setup Time 12/28/2012 11:16   Final    Colony Count >=100,000 COLONIES/ML   Final    Culture ENTEROCOCCUS SPECIES   Final    Report Status 12/30/2012 FINAL   Final    Organism ID, Bacteria ENTEROCOCCUS SPECIES   Final   CULTURE, BLOOD (ROUTINE X 2)     Status: Normal (Preliminary result)   Collection Time   12/28/12  1:35 AM      Component Value Range Status Comment   Specimen Description BLOOD RIGHT ARM   Final  Special Requests BOTTLES DRAWN AEROBIC AND ANAEROBIC 3CC EACH   Final    Culture  Setup Time 12/28/2012 11:19   Final    Culture     Final    Value:        BLOOD CULTURE RECEIVED NO GROWTH TO DATE CULTURE WILL BE HELD FOR 5 DAYS BEFORE ISSUING A FINAL NEGATIVE REPORT   Report Status PENDING   Incomplete   CULTURE, BLOOD (ROUTINE X 2)     Status: Normal (Preliminary result)   Collection Time   12/28/12  1:50 AM      Component Value Range Status Comment   Specimen Description BLOOD LEFT ARM   Final    Special Requests BOTTLES DRAWN AEROBIC AND ANAEROBIC 5CC EACH   Final    Culture  Setup Time 12/29/2012 11:31   Final    Culture     Final    Value:        BLOOD CULTURE RECEIVED NO GROWTH TO DATE CULTURE WILL BE HELD FOR 5 DAYS BEFORE ISSUING A FINAL NEGATIVE REPORT   Report Status PENDING   Incomplete       12/31/2012, 2:02 PM     LOS: 3 days

## 2012-12-31 NOTE — Progress Notes (Signed)
Patient ID: Kevin [name], male   DOB: [birth year], 67 y.o.   MRN: [id number] BP 115/60  Pulse 81  Temp 99.1 F (37.3 C) (Oral)  Resp 18  Ht 6\' 2"  (1.88 m)  Wt 113.399 kg (250 lb)  BMI 32.10 kg/m2  SpO2 96% Alert and oriented x 4 Moving lower extremities, though with significant pain Or is booked for tomorrow. Will culture the region tomorrow during the case.  Risks and benefits explained. He wishes to proceed.

## 2012-12-31 NOTE — Progress Notes (Signed)
Subjective: No change in back pain  Objective: Vital signs in last 24 hours: Temp:  [98.5 F (36.9 C)-99.4 F (37.4 C)] 98.6 F (37 C) (01/07 0700) Pulse Rate:  [78-85] 78  (01/07 0700) Resp:  [18-22] 18  (01/07 0700) BP: (106-145)/(55-70) 106/55 mmHg (01/07 0700) SpO2:  [98 %-99 %] 98 % (01/07 0700) Weight change:  Last BM Date: 12/27/12  Intake/Output from previous day: 01/06 0701 - 01/07 0700 In: 240 [P.O.:240] Out: -  Intake/Output this shift:    General appearance: alert and cooperative Resp: clear to auscultation bilaterally Cardio: regular rate and rhythm, S1, S2 normal, no murmur, click, rub or gallop GI: soft, non-tender; bowel sounds normal; no masses,  no organomegaly  Lab Results:  Basename 12/31/12 0615 12/30/12 0800  WBC 7.6 6.8  HGB 9.4* 9.3*  HCT 30.1* 30.0*  PLT 323 300   BMET  Basename 12/30/12 0800 12/29/12 0749  NA 134* 136  K 3.5 3.6  CL 99 101  CO2 22 24  GLUCOSE 126* 126*  BUN 11 15  CREATININE 0.63 0.75  CALCIUM 8.8 8.9    Studies/Results: No results found.  Medications: I have reviewed the patient's current medications.  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  Dr. Mikal Plane likely to remove L5 screw on 1/8 Urinary tract infection - enterococcus.. Blood cultures x2 NG so far. On amoxicillin DIABETES MELLITUS -controlled - ADA diet and sliding scale insulin   HYPERTENSION - controlled  SLEEP APNEA - continue CPAP - CPAP machine family to use their own  Anemia,microcytic, check iron studies, ferritin,probably chronic disease. There is no medical contraindication to planned lumbar procedure    LOS: 3 days   Kevin [name] JOSEPH 12/31/2012, 7:27 AM

## 2012-12-31 NOTE — Progress Notes (Signed)
Refused potassium supplement

## 2013-01-01 ENCOUNTER — Encounter (HOSPITAL_COMMUNITY): Payer: Self-pay | Admitting: Anesthesiology

## 2013-01-01 ENCOUNTER — Inpatient Hospital Stay (HOSPITAL_COMMUNITY): Payer: Medicare Other | Admitting: Anesthesiology

## 2013-01-01 ENCOUNTER — Encounter (HOSPITAL_COMMUNITY)
Admission: EM | Disposition: A | Discharge status: Home Health | Payer: Self-pay | Source: Home / Self Care | Attending: Internal Medicine

## 2013-01-01 ENCOUNTER — Inpatient Hospital Stay (HOSPITAL_COMMUNITY): Payer: Medicare Other

## 2013-01-01 LAB — PROTIME-INR
INR: 1.29 (ref 0.00–1.49)
Prothrombin Time: 15.8 seconds — ABNORMAL HIGH (ref 11.6–15.2)

## 2013-01-01 LAB — GLUCOSE, CAPILLARY
Glucose-Capillary: 173 mg/dL — ABNORMAL HIGH (ref 70–99)
Glucose-Capillary: 100 mg/dL — ABNORMAL HIGH (ref 70–99)
Glucose-Capillary: 88 mg/dL (ref 70–99)
Glucose-Capillary: 89 mg/dL (ref 70–99)
Glucose-Capillary: 85 mg/dL (ref 70–99)
Glucose-Capillary: 94 mg/dL (ref 70–99)

## 2013-01-01 LAB — CBC
HCT: 30.3 % — ABNORMAL LOW (ref 39.0–52.0)
Hemoglobin: 9.2 g/dL — ABNORMAL LOW (ref 13.0–17.0)
MCH: 23.7 pg — ABNORMAL LOW (ref 26.0–34.0)
MCHC: 30.4 g/dL (ref 30.0–36.0)
MCV: 77.9 fL — ABNORMAL LOW (ref 78.0–100.0)
Platelets: 314 10*3/uL (ref 150–400)
RBC: 3.89 MIL/uL — ABNORMAL LOW (ref 4.22–5.81)
RDW: 17.7 % — ABNORMAL HIGH (ref 11.5–15.5)
WBC: 7.8 10*3/uL (ref 4.0–10.5)

## 2013-01-01 LAB — GRAM STAIN

## 2013-01-01 LAB — BASIC METABOLIC PANEL
BUN: 10 mg/dL (ref 6–23)
CO2: 25 mEq/L (ref 19–32)
Calcium: 9.1 mg/dL (ref 8.4–10.5)
Chloride: 98 mEq/L (ref 96–112)
Creatinine, Ser: 0.65 mg/dL (ref 0.50–1.35)
GFR calc Af Amer: >90 mL/min (ref >90)
GFR calc non Af Amer: >90 mL/min (ref >90)
Glucose, Bld: 99 mg/dL (ref 70–99)
Potassium: 3.7 mEq/L (ref 3.5–5.1)
Sodium: 134 mEq/L — ABNORMAL LOW (ref 135–145)

## 2013-01-01 LAB — APTT: aPTT: 48 seconds — ABNORMAL HIGH (ref 24–37)

## 2013-01-01 LAB — HEMOGLOBIN A1C
Hgb A1c MFr Bld: 6.6 % — ABNORMAL HIGH (ref <5.7)
Mean Plasma Glucose: 143 mg/dL — ABNORMAL HIGH (ref <117)

## 2013-01-01 SURGERY — POSTERIOR LUMBAR FUSION 1 WITH HARDWARE REMOVAL
Anesthesia: General | Site: Back | Wound class: Clean Contaminated

## 2013-01-01 MED ORDER — ONDANSETRON HCL 4 MG/2ML IJ SOLN
INTRAMUSCULAR | Status: DC | PRN
  Administered 2013-01-01: 4 mg via INTRAVENOUS

## 2013-01-01 MED ORDER — DIAZEPAM 5 MG PO TABS: 10.0000 mg | ORAL_TABLET | ORAL | Status: DC

## 2013-01-01 MED ORDER — GLYCOPYRROLATE 0.2 MG/ML IJ SOLN
INTRAMUSCULAR | Status: DC | PRN
  Administered 2013-01-01: 0.2 mg via INTRAVENOUS
  Administered 2013-01-01: 0.4 mg via INTRAVENOUS

## 2013-01-01 MED ORDER — 0.9 % SODIUM CHLORIDE (POUR BTL) OPTIME
TOPICAL | Status: DC | PRN
  Administered 2013-01-01: 1000 mL

## 2013-01-01 MED ORDER — KETOROLAC TROMETHAMINE 30 MG/ML IJ SOLN
30.0000 mg | Freq: Four times a day (QID) | INTRAMUSCULAR | Status: AC
  Administered 2013-01-01 – 2013-01-06 (×17): 30 mg via INTRAVENOUS
  Filled 2013-01-01 (×26): qty 1

## 2013-01-01 MED ORDER — HYDROMORPHONE HCL PF 1 MG/ML IJ SOLN
0.2500 mg | INTRAMUSCULAR | Status: DC | PRN
  Administered 2013-01-01 (×2): 0.5 mg via INTRAVENOUS
  Filled 2013-01-01: qty 1

## 2013-01-01 MED ORDER — BUPIVACAINE LIPOSOME 1.3 % IJ SUSP
20.0000 mL | Freq: Once | INTRAMUSCULAR | Status: DC
  Filled 2013-01-01: qty 20

## 2013-01-01 MED ORDER — ALBUMIN HUMAN 5 % IV SOLN
INTRAVENOUS | Status: DC | PRN
  Administered 2013-01-01 (×2): via INTRAVENOUS

## 2013-01-01 MED ORDER — SENNOSIDES-DOCUSATE SODIUM 8.6-50 MG PO TABS
2.0000 | ORAL_TABLET | Freq: Every day | ORAL | Status: DC
  Administered 2013-01-01 – 2013-01-05 (×5): 2 via ORAL
  Filled 2013-01-01 (×2): qty 2
  Filled 2013-01-01 (×3): qty 1
  Filled 2013-01-01: qty 2

## 2013-01-01 MED ORDER — ONDANSETRON HCL 4 MG/2ML IJ SOLN: 4.0000 mg | Freq: Once | INTRAMUSCULAR | Status: AC | PRN

## 2013-01-01 MED ORDER — ROCURONIUM BROMIDE 100 MG/10ML IV SOLN
INTRAVENOUS | Status: DC | PRN
  Administered 2013-01-01: 50 mg via INTRAVENOUS

## 2013-01-01 MED ORDER — PHENYLEPHRINE HCL 10 MG/ML IJ SOLN
INTRAMUSCULAR | Status: DC | PRN
  Administered 2013-01-01: 40 ug via INTRAVENOUS
  Administered 2013-01-01 (×2): 80 ug via INTRAVENOUS
  Administered 2013-01-01: 120 ug via INTRAVENOUS
  Administered 2013-01-01: 80 ug via INTRAVENOUS
  Administered 2013-01-01: 240 ug via INTRAVENOUS
  Administered 2013-01-01: 80 ug via INTRAVENOUS

## 2013-01-01 MED ORDER — EPHEDRINE SULFATE 50 MG/ML IJ SOLN
INTRAMUSCULAR | Status: DC | PRN
  Administered 2013-01-01 (×3): 10 mg via INTRAVENOUS

## 2013-01-01 MED ORDER — ACETAMINOPHEN 10 MG/ML IV SOLN
1000.0000 mg | Freq: Four times a day (QID) | INTRAVENOUS | Status: AC
  Administered 2013-01-01 – 2013-01-02 (×4): 1000 mg via INTRAVENOUS
  Filled 2013-01-01 (×4): qty 100

## 2013-01-01 MED ORDER — LACTATED RINGERS IV SOLN
INTRAVENOUS | Status: DC | PRN
  Administered 2013-01-01 (×2): via INTRAVENOUS

## 2013-01-01 MED ORDER — BUPIVACAINE LIPOSOME 1.3 % IJ SUSP
INTRAMUSCULAR | Status: DC | PRN
  Administered 2013-01-01: 20 mL

## 2013-01-01 MED ORDER — PROPOFOL 10 MG/ML IV BOLUS
INTRAVENOUS | Status: DC | PRN
  Administered 2013-01-01: 300 mg via INTRAVENOUS

## 2013-01-01 MED ORDER — VANCOMYCIN HCL 1000 MG IV SOLR
1000.0000 mg | INTRAVENOUS | Status: DC | PRN
  Administered 2013-01-01: 1000 mg via INTRAVENOUS

## 2013-01-01 MED ORDER — NEOSTIGMINE METHYLSULFATE 1 MG/ML IJ SOLN
INTRAMUSCULAR | Status: DC | PRN
  Administered 2013-01-01: 1 mg via INTRAVENOUS
  Administered 2013-01-01: 3 mg via INTRAVENOUS

## 2013-01-01 MED ORDER — THROMBIN 20000 UNITS EX SOLR
CUTANEOUS | Status: DC | PRN
  Administered 2013-01-01: 16:00:00 via TOPICAL

## 2013-01-01 MED ORDER — LIDOCAINE HCL (CARDIAC) 20 MG/ML IV SOLN
INTRAVENOUS | Status: DC | PRN
  Administered 2013-01-01: 60 mg via INTRAVENOUS

## 2013-01-01 MED ORDER — VECURONIUM BROMIDE 10 MG IV SOLR
INTRAVENOUS | Status: DC | PRN
  Administered 2013-01-01: 1 mg via INTRAVENOUS
  Administered 2013-01-01: 2 mg via INTRAVENOUS
  Administered 2013-01-01: 1 mg via INTRAVENOUS

## 2013-01-01 MED ORDER — FENTANYL CITRATE 0.05 MG/ML IJ SOLN
INTRAMUSCULAR | Status: DC | PRN
  Administered 2013-01-01 (×3): 50 ug via INTRAVENOUS
  Administered 2013-01-01: 150 ug via INTRAVENOUS
  Administered 2013-01-01 (×2): 25 ug via INTRAVENOUS

## 2013-01-01 MED ORDER — POLYETHYLENE GLYCOL 3350 17 G PO PACK
17.0000 g | PACK | Freq: Every day | ORAL | Status: DC | PRN
  Filled 2013-01-01: qty 1

## 2013-01-01 SURGICAL SUPPLY — 71 items
ADH SKN CLS APL DERMABOND .7 (GAUZE/BANDAGES/DRESSINGS)
ADH SKN CLS LQ APL DERMABOND (GAUZE/BANDAGES/DRESSINGS) ×2
APL SKNCLS STERI-STRIP NONHPOA (GAUZE/BANDAGES/DRESSINGS)
BAG DECANTER FOR FLEXI CONT (MISCELLANEOUS) ×2 IMPLANT
BENZOIN TINCTURE PRP APPL 2/3 (GAUZE/BANDAGES/DRESSINGS) IMPLANT
BLADE SURG ROTATE 9660 (MISCELLANEOUS) ×1 IMPLANT
BUR MATCHSTICK NEURO 3.0 LAGG (BURR) ×2 IMPLANT
CANISTER SUCTION 2500CC (MISCELLANEOUS) ×2 IMPLANT
CATH ROBINSON RED A/P 14FR (CATHETERS) ×1 IMPLANT
CLOTH BEACON ORANGE TIMEOUT ST (SAFETY) ×2 IMPLANT
CONT SPEC 4OZ CLIKSEAL STRL BL (MISCELLANEOUS) ×2 IMPLANT
COVER BACK TABLE 24X17X13 BIG (DRAPES) IMPLANT
DECANTER SPIKE VIAL GLASS SM (MISCELLANEOUS) ×2 IMPLANT
DERMABOND ADHESIVE PROPEN (GAUZE/BANDAGES/DRESSINGS) ×2
DERMABOND ADVANCED (GAUZE/BANDAGES/DRESSINGS)
DERMABOND ADVANCED .7 DNX12 (GAUZE/BANDAGES/DRESSINGS) ×1 IMPLANT
DERMABOND ADVANCED .7 DNX6 (GAUZE/BANDAGES/DRESSINGS) IMPLANT
DRAPE C-ARM 42X72 X-RAY (DRAPES) ×2 IMPLANT
DRAPE LAPAROTOMY 100X72X124 (DRAPES) ×2 IMPLANT
DRAPE POUCH INSTRU U-SHP 10X18 (DRAPES) ×2 IMPLANT
DRAPE SURG 17X23 STRL (DRAPES) ×2 IMPLANT
DRESSING TELFA 8X3 (GAUZE/BANDAGES/DRESSINGS) IMPLANT
DURAPREP 26ML APPLICATOR (WOUND CARE) ×2 IMPLANT
ELECT REM PT RETURN 9FT ADLT (ELECTROSURGICAL) ×2
ELECTRODE REM PT RTRN 9FT ADLT (ELECTROSURGICAL) ×1 IMPLANT
GAUZE SPONGE 4X4 16PLY XRAY LF (GAUZE/BANDAGES/DRESSINGS) ×1 IMPLANT
GLOVE BIO SURGEON STRL SZ8 (GLOVE) ×1 IMPLANT
GLOVE BIOGEL PI IND STRL 7.5 (GLOVE) IMPLANT
GLOVE BIOGEL PI INDICATOR 7.5 (GLOVE) ×1
GLOVE ECLIPSE 6.5 STRL STRAW (GLOVE) ×3 IMPLANT
GLOVE ECLIPSE 7.5 STRL STRAW (GLOVE) ×2 IMPLANT
GLOVE EXAM NITRILE LRG STRL (GLOVE) IMPLANT
GLOVE EXAM NITRILE MD LF STRL (GLOVE) IMPLANT
GLOVE EXAM NITRILE XL STR (GLOVE) IMPLANT
GLOVE EXAM NITRILE XS STR PU (GLOVE) IMPLANT
GOWN BRE IMP SLV AUR LG STRL (GOWN DISPOSABLE) ×3 IMPLANT
GOWN BRE IMP SLV AUR XL STRL (GOWN DISPOSABLE) ×2 IMPLANT
GOWN STRL REIN 2XL LVL4 (GOWN DISPOSABLE) IMPLANT
KIT BASIN OR (CUSTOM PROCEDURE TRAY) ×2 IMPLANT
KIT POSITION SURG JACKSON T1 (MISCELLANEOUS) ×1 IMPLANT
KIT ROOM TURNOVER OR (KITS) ×2 IMPLANT
NDL HYPO 21X1.5 SAFETY (NEEDLE) IMPLANT
NDL HYPO 25X1 1.5 SAFETY (NEEDLE) ×1 IMPLANT
NDL SPNL 18GX3.5 QUINCKE PK (NEEDLE) IMPLANT
NEEDLE BONE MARROW 8GAX6 (NEEDLE) ×1 IMPLANT
NEEDLE HYPO 21X1.5 SAFETY (NEEDLE) ×2 IMPLANT
NEEDLE HYPO 25X1 1.5 SAFETY (NEEDLE) ×2 IMPLANT
NEEDLE SPNL 18GX3.5 QUINCKE PK (NEEDLE) ×2 IMPLANT
NS IRRIG 1000ML POUR BTL (IV SOLUTION) ×2 IMPLANT
PACK FOAM VITOSS 10CC (Orthopedic Implant) ×1 IMPLANT
PACK LAMINECTOMY NEURO (CUSTOM PROCEDURE TRAY) ×2 IMPLANT
PAD ARMBOARD 7.5X6 YLW CONV (MISCELLANEOUS) ×8 IMPLANT
ROD 35MM (Rod) ×1 IMPLANT
SCREW LOCK (Screw) ×6 IMPLANT
SCREW LOCK FXNS SPNE MAS PL (Screw) IMPLANT
SPONGE GAUZE 4X4 12PLY (GAUZE/BANDAGES/DRESSINGS) IMPLANT
SPONGE LAP 4X18 X RAY DECT (DISPOSABLE) IMPLANT
SPONGE SURGIFOAM ABS GEL 100 (HEMOSTASIS) ×2 IMPLANT
STRIP CLOSURE SKIN 1/2X4 (GAUZE/BANDAGES/DRESSINGS) IMPLANT
SUT PROLENE 6 0 BV (SUTURE) IMPLANT
SUT VIC AB 0 CT1 18XCR BRD8 (SUTURE) ×1 IMPLANT
SUT VIC AB 0 CT1 8-18 (SUTURE) ×4
SUT VIC AB 2-0 CP2 18 (SUTURE) ×1 IMPLANT
SUT VIC AB 2-0 CT1 18 (SUTURE) ×2 IMPLANT
SUT VIC AB 3-0 SH 8-18 (SUTURE) ×5 IMPLANT
SYR 20CC LL (SYRINGE) ×1 IMPLANT
SYR 20ML ECCENTRIC (SYRINGE) ×2 IMPLANT
SYRINGE 10CC LL (SYRINGE) ×1 IMPLANT
TOWEL OR 17X24 6PK STRL BLUE (TOWEL DISPOSABLE) ×2 IMPLANT
TOWEL OR 17X26 10 PK STRL BLUE (TOWEL DISPOSABLE) ×2 IMPLANT
WATER STERILE IRR 1000ML POUR (IV SOLUTION) ×2 IMPLANT

## 2013-01-01 NOTE — Transfer of Care (Signed)
Immediate Anesthesia Transfer of Care Note  Patient: Kevin [name]  Procedure(s) Performed: Procedure(s) (LRB) with comments: POSTERIOR LUMBAR FUSION 1 WITH HARDWARE REMOVAL (N/A) - posterior lumbar hardware removal with rod placement  Patient Location: PACU  Anesthesia Type:General  Level of Consciousness: awake and patient cooperative  Airway & Oxygen Therapy: Patient Spontanous Breathing and Patient connected to face mask oxygen  Post-op Assessment: Report given to PACU RN, Post -op Vital signs reviewed and stable and Patient moving all extremities X 4  Post vital signs: Reviewed and stable  Complications: No apparent anesthesia complications

## 2013-01-01 NOTE — Op Note (Signed)
12/28/2012 - 01/01/2013  9:31 PM  PATIENT:  Kevin [name]  67 y.o. male admitted due to a UTI, evidence of a loose pedicle screw on the left at L5, and severe back pain. He will be taken to the operating room for removal of the L5 screw  PRE-OPERATIVE DIAGNOSIS:  osteomyelitis  POST-OPERATIVE DIAGNOSIS:  osteomyelitis   PROCEDURE:  Procedure(s): POSTERIOR LUMBAR FUSION 1 WITH HARDWARE REMOVAL L5 pedicle screw  SURGEON:  Surgeon(s): Carmela Hurt, MD Tia Alert, MD  ASSISTANTS:JOnes, Onalee Hua  ANESTHESIA:   general  EBL:  Total I/O In: -  Out: 130 [Urine:130]  BLOOD ADMINISTERED:none  CELL SAVER GIVEN:none  COUNT:per nursing  DRAINS: none   SPECIMEN:  Source of Specimen:  L5 pedicle  DICTATION: Mr. Bellucci was taken to the operating room intubated and placed under a general anesthetic. He was positioned prone on a wilson frame with all pressure points properly padded. His back was prepped and he was draped in a sterile manner. I opened the previous incision and dissected through to the thoracolumbar fascia. I identified the hardware and transverse processes of L4 and L5 on the left side. I exposed the Iliac crest on the left also. With Dr. Yetta Barre Assistance we exposed the L3,4,and 5 screws on the left side. I removed the locking caps, then the rod and removed the L5 screw on the left. I cultured the screw hole and the screw. It was indeed quite loose. Due to this I decorticated the transverse processes of L4 and L5 and using bone marrow soaked vitoss performed a posterolateral arthrodesis of L4 and L5. The marrow was obtained by using a trocar and syringe placed in the iliac crest.  I replaced the rod with a shorter one connecting L4 and L3 on the left. I irrigated the wound then closed with Dr. Yetta Barre assistance. There was absolutely no evidence of a wound infection, no purulence was encountered. We closed the wound in layers with vicryl. I used dermabond for a sterile dressing.    PLAN OF CARE: Admit to inpatient   PATIENT DISPOSITION:  PACU - hemodynamically stable.   Delay start of Pharmacological VTE agent (>24hrs) due to surgical blood loss or risk of bleeding:  yes

## 2013-01-01 NOTE — Progress Notes (Signed)
Subjective: No new complaints  Objective: Vital signs in last 24 hours: Temp:  [98.4 F (36.9 C)-99.2 F (37.3 C)] 99.2 F (37.3 C) (01/08 0640) Pulse Rate:  [76-101] 80  (01/08 0640) Resp:  [18-20] 20  (01/08 0640) BP: (115-133)/(54-67) 128/55 mmHg (01/08 0640) SpO2:  [95 %-99 %] 95 % (01/08 0640) Weight change:  Last BM Date: 12/27/12  Intake/Output from previous day:   Intake/Output this shift:    General appearance: alert and cooperative Resp: clear to auscultation bilaterally Cardio: regular rate and rhythm, S1, S2 normal, no murmur, click, rub or gallop  Lab Results:  Basename 01/01/13 0600 12/31/12 0615  WBC 7.8 7.6  HGB 9.2* 9.4*  HCT 30.3* 30.1*  PLT 314 323   BMET  Basename 12/31/12 0615 12/30/12 0800  NA 136 134*  K 3.7 3.5  CL 99 99  CO2 26 22  GLUCOSE 85 126*  BUN 9 11  CREATININE 0.61 0.63  CALCIUM 9.1 8.8    Studies/Results: No results found.  Medications: I have reviewed the patient's current medications.  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 Surgery today.  Antibiotics per ID after surgery and culture obrained Urinary tract infection - enterococcus.. Blood cultures x2 NG so far. On amoxicillin  DIABETES MELLITUS -controlled -lantus and sliding scale insulin  HYPERTENSION - controlled  SLEEP APNEA - continue CPAP - CPAP machine family to use their own  Anemia,microcytic,start iron one a day Pain Control  Percocet q4, start senekot s and miralax prn There is no medical contraindication to planned lumbar procedure    LOS: 4 days   Kevin [name] JOSEPH 01/01/2013, 7:26 AM

## 2013-01-01 NOTE — Anesthesia Postprocedure Evaluation (Signed)
  Anesthesia Post-op Note  Patient: Kevin [name]  Procedure(s) Performed: Procedure(s) (LRB) with comments: POSTERIOR LUMBAR FUSION 1 WITH HARDWARE REMOVAL (N/A) - posterior lumbar hardware removal with rod placement  Patient Location: PACU  Anesthesia Type:General  Level of Consciousness: awake, alert  and oriented  Airway and Oxygen Therapy: Patient Spontanous Breathing  Post-op Pain: mild  Post-op Assessment: Post-op Vital signs reviewed, Patient's Cardiovascular Status Stable, Respiratory Function Stable, Patent Airway and No signs of Nausea or vomiting  Post-op Vital Signs: Reviewed and stable  Complications: No apparent anesthesia complications

## 2013-01-01 NOTE — Anesthesia Preprocedure Evaluation (Signed)
Anesthesia Evaluation  Patient identified by MRN, date of birth, ID band Patient awake    Reviewed: Allergy & Precautions, H&P , NPO status , Patient's Chart, lab work & pertinent test results  Airway Mallampati: II TM Distance: >3 FB Neck ROM: full    Dental   Pulmonary sleep apnea ,          Cardiovascular hypertension, + Peripheral Vascular Disease Rhythm:regular Rate:Normal     Neuro/Psych    GI/Hepatic hiatal hernia, GERD-  ,  Endo/Other  diabetes, Type 2, Oral Hypoglycemic Agents and Insulin DependentMorbid obesity  Renal/GU      Musculoskeletal   Abdominal   Peds  Hematology   Anesthesia Other Findings   Reproductive/Obstetrics                           Anesthesia Physical Anesthesia Plan  ASA: III  Anesthesia Plan: General   Post-op Pain Management:    Induction: Intravenous  Airway Management Planned: Oral ETT  Additional Equipment:   Intra-op Plan:   Post-operative Plan: Extubation in OR  Informed Consent: I have reviewed the patients History and Physical, chart, labs and discussed the procedure including the risks, benefits and alternatives for the proposed anesthesia with the patient or authorized representative who has indicated his/her understanding and acceptance.     Plan Discussed with: CRNA, Anesthesiologist and Surgeon  Anesthesia Plan Comments:         Anesthesia Quick Evaluation

## 2013-01-01 NOTE — Anesthesia Procedure Notes (Signed)
Procedure Name: Intubation Date/Time: 01/01/2013 3:42 PM Performed by: Jerilee Hoh Pre-anesthesia Checklist: Patient identified, Emergency Drugs available, Suction available and Patient being monitored Patient Re-evaluated:Patient Re-evaluated prior to inductionOxygen Delivery Method: Circle system utilized Preoxygenation: Pre-oxygenation with 100% oxygen Intubation Type: IV induction Ventilation: Mask ventilation without difficulty Laryngoscope Size: Mac and 4 Grade View: Grade I Tube type: Oral Tube size: 7.5 mm Number of attempts: 1 Airway Equipment and Method: Stylet Placement Confirmation: ETT inserted through vocal cords under direct vision,  positive ETCO2 and breath sounds checked- equal and bilateral Secured at: 22 cm Tube secured with: Tape Dental Injury: Teeth and Oropharynx as per pre-operative assessment

## 2013-01-01 NOTE — Preoperative (Signed)
Beta Blockers   Reason not to administer Beta Blockers:Not Applicable 

## 2013-01-02 DIAGNOSIS — Y838 Other surgical procedures as the cause of abnormal reaction of the patient, or of later complication, without mention of misadventure at the time of the procedure: Secondary | ICD-10-CM

## 2013-01-02 DIAGNOSIS — T8140XA Infection following a procedure, unspecified, initial encounter: Secondary | ICD-10-CM

## 2013-01-02 LAB — CBC
HCT: 25 % — ABNORMAL LOW (ref 39.0–52.0)
Hemoglobin: 7.6 g/dL — ABNORMAL LOW (ref 13.0–17.0)
MCH: 23.8 pg — ABNORMAL LOW (ref 26.0–34.0)
MCHC: 30.4 g/dL (ref 30.0–36.0)
MCV: 78.1 fL (ref 78.0–100.0)
Platelets: 283 10*3/uL (ref 150–400)
RBC: 3.2 MIL/uL — ABNORMAL LOW (ref 4.22–5.81)
RDW: 17.5 % — ABNORMAL HIGH (ref 11.5–15.5)
WBC: 7.1 10*3/uL (ref 4.0–10.5)

## 2013-01-02 LAB — GLUCOSE, CAPILLARY
Glucose-Capillary: 118 mg/dL — ABNORMAL HIGH (ref 70–99)
Glucose-Capillary: 153 mg/dL — ABNORMAL HIGH (ref 70–99)
Glucose-Capillary: 173 mg/dL — ABNORMAL HIGH (ref 70–99)

## 2013-01-02 LAB — BASIC METABOLIC PANEL
BUN: 10 mg/dL (ref 6–23)
CO2: 23 mEq/L (ref 19–32)
Calcium: 8.9 mg/dL (ref 8.4–10.5)
Chloride: 98 mEq/L (ref 96–112)
Creatinine, Ser: 0.64 mg/dL (ref 0.50–1.35)
GFR calc Af Amer: >90 mL/min (ref >90)
GFR calc non Af Amer: >90 mL/min (ref >90)
Glucose, Bld: 157 mg/dL — ABNORMAL HIGH (ref 70–99)
Potassium: 4.1 mEq/L (ref 3.5–5.1)
Sodium: 134 mEq/L — ABNORMAL LOW (ref 135–145)

## 2013-01-02 MED ORDER — CYCLOBENZAPRINE HCL 10 MG PO TABS
10.0000 mg | ORAL_TABLET | Freq: Three times a day (TID) | ORAL | Status: DC | PRN
  Administered 2013-01-04 – 2013-01-05 (×4): 10 mg via ORAL
  Filled 2013-01-02 (×4): qty 1

## 2013-01-02 MED ORDER — PHENOL 1.4 % MT LIQD: 1.0000 | OROMUCOSAL | Status: DC | PRN

## 2013-01-02 MED ORDER — MORPHINE SULFATE 2 MG/ML IJ SOLN
1.0000 mg | INTRAMUSCULAR | Status: DC | PRN
  Administered 2013-01-02 – 2013-01-05 (×4): 2 mg via INTRAVENOUS
  Filled 2013-01-02 (×4): qty 1

## 2013-01-02 MED ORDER — SODIUM CHLORIDE 0.9 % IV SOLN: 250.0000 mL | INTRAVENOUS | Status: DC

## 2013-01-02 MED ORDER — MENTHOL 3 MG MT LOZG
1.0000 | LOZENGE | OROMUCOSAL | Status: DC | PRN
  Filled 2013-01-02: qty 9

## 2013-01-02 MED ORDER — OXYCODONE HCL 5 MG PO TABS: 5.0000 mg | ORAL_TABLET | ORAL | Status: DC | PRN

## 2013-01-02 MED ORDER — FERROUS SULFATE 325 (65 FE) MG PO TABS
325.0000 mg | ORAL_TABLET | Freq: Two times a day (BID) | ORAL | Status: DC
  Administered 2013-01-02 – 2013-01-05 (×6): 325 mg via ORAL
  Filled 2013-01-02 (×10): qty 1

## 2013-01-02 MED ORDER — POTASSIUM CHLORIDE IN NACL 20-0.9 MEQ/L-% IV SOLN
INTRAVENOUS | Status: DC
  Administered 2013-01-02 – 2013-01-03 (×2): via INTRAVENOUS
  Filled 2013-01-02 (×4): qty 1000

## 2013-01-02 MED ORDER — ONDANSETRON HCL 4 MG/2ML IJ SOLN: 4.0000 mg | INTRAMUSCULAR | Status: DC | PRN

## 2013-01-02 MED ORDER — SODIUM CHLORIDE 0.9 % IJ SOLN
3.0000 mL | Freq: Two times a day (BID) | INTRAMUSCULAR | Status: DC
  Administered 2013-01-03 – 2013-01-06 (×5): 3 mL via INTRAVENOUS

## 2013-01-02 MED FILL — Ketorolac Tromethamine Inj 30 MG/ML: INTRAMUSCULAR | Qty: 1 | Status: AC

## 2013-01-02 NOTE — Progress Notes (Signed)
Subjective: Less back pain  Objective: Vital signs in last 24 hours: Temp:  [97.8 F (36.6 C)-99.4 F (37.4 C)] 99.4 F (37.4 C) (01/09 0551) Pulse Rate:  [71-104] 79  (01/09 0551) Resp:  [18-34] 20  (01/09 0551) BP: (127-142)/(50-76) 127/50 mmHg (01/09 0551) SpO2:  [96 %-100 %] 99 % (01/09 0551) Weight change:  Last BM Date: 12/27/12  Intake/Output from previous day: 01/08 0701 - 01/09 0700 In: 2700 [I.V.:2200; IV Piggyback:500] Out: 630 [Urine:230; Blood:400] Intake/Output this shift:    General appearance: alert and cooperative Resp: clear to auscultation bilaterally Cardio: regular rate and rhythm, S1, S2 normal, no murmur, click, rub or gallop  Lab Results:  Basename 01/02/13 0545 01/01/13 0600  WBC 7.1 7.8  HGB 7.6* 9.2*  HCT 25.0* 30.3*  PLT 283 314   BMET  Basename 01/01/13 0600 12/31/12 0615  NA 134* 136  K 3.7 3.7  CL 98 99  CO2 25 26  GLUCOSE 99 85  BUN 10 9  CREATININE 0.65 0.61  CALCIUM 9.1 9.1    Studies/Results: Dg Lumbar Spine 2-3 Views  01/01/2013  *RADIOLOGY REPORT*  Clinical Data: Lumbar hardware removal.  LUMBAR SPINE - 2-3 VIEW  Comparison: 10/24/2012.  01/17/2013.  Findings: Intraoperative localization demonstrates a posterior approach to rod and pedicle screw fixation from L3-L5.  The initial image demonstrates a probe over the dorsal aspect of the L5 rod and pedicle screw fixation.  The second image demonstrates a probe dorsal to the L4-L5 interspace.  IMPRESSION: Intraoperative localization at L4-L5.   Original Report Authenticated By: Andreas Newport, M.D.     Medications: I have reviewed the patient's current medications.  Assessment/Plan: Present on Admission:  Possible vetebral osteomyelitis, Surgery yesterday, hardware removed, no definite sign of infection, cultures pending. Urinary tract infection - enterococcus.. Blood cultures x2 NG so far. On amoxicillin day 4 DIABETES MELLITUS -controlled -lantus and sliding scale insulin    HYPERTENSION - controlled  SLEEP APNEA - continue CPAP   Anemia,microcytic,start iron twice a day, hgb 9.2 - 7.6.  Recheck in am, no transfusion yet Pain Control Percocet q4, started senekot s and miralax prn  Disposition  Have PT see, possible discharge on 1/11    LOS: 5 days   Champ Keetch JOSEPH 01/02/2013, 7:16 AM

## 2013-01-02 NOTE — Clinical Social Work Note (Signed)
Clinical Social Work   CSW received consult for SNF. CSW reviewed chart and discussed pt with RN during progression. Per attending's note, pt will possibly discharge home on 01/04/13. Awaiting PT/OT evals for discharge recommendations. PT/OT evals are needed for insurance prior auth for SNF. CSW will assess for SNF, if appropriate. Please call with an urgent needs. CSW will continue to follow.   Dede Query, MSW, [facility] [phone]

## 2013-01-02 NOTE — Progress Notes (Signed)
INFECTIOUS DISEASE PROGRESS NOTE  ID: Kevin [name] is a 67 y.o. male with   Principal Problem:  *UTI (lower urinary tract infection) Active Problems:  DIABETES MELLITUS  HYPERLIPIDEMIA  OBESITY, MORBID  HYPERTENSION  SLEEP APNEA  Encephalopathy acute  Subjective: Some stiffness, surgical site pain.   Abtx:  Anti-infectives     Start     Dose/Rate Route Frequency Ordered Stop   12/30/12 1500   amoxicillin (AMOXIL) capsule 500 mg        500 mg Oral 3 times per day 12/30/12 1415     12/28/12 1100   cefTRIAXone (ROCEPHIN) 1 g in dextrose 5 % 50 mL IVPB  Status:  Discontinued        1 g 100 mL/hr over 30 Minutes Intravenous Every 24 hours 12/28/12 0853 12/30/12 1414   12/28/12 0230   cefTRIAXone (ROCEPHIN) 1 g in dextrose 5 % 50 mL IVPB        1 g 100 mL/hr over 30 Minutes Intravenous  Once 12/28/12 0220 12/28/12 0415          Medications:  Scheduled:   . amLODipine  5 mg Oral Daily  . amoxicillin  500 mg Oral Q8H  . ferrous sulfate  325 mg Oral BID WC  . furosemide  40 mg Oral Daily  . insulin aspart  0-15 Units Subcutaneous TID WC  . insulin aspart  0-5 Units Subcutaneous QHS  . insulin glargine  80 Units Subcutaneous QHS  . ketorolac  30 mg Intravenous Q6H  . losartan  100 mg Oral Daily  . pantoprazole  40 mg Oral Daily  . potassium chloride SA  20 mEq Oral BID  . senna-docusate  2 tablet Oral QHS  . simvastatin  20 mg Oral QPM  . sodium chloride  3 mL Intravenous Q12H    Objective: Vital signs in last 24 hours: Temp:  [97.4 F (36.3 C)-99.6 F (37.6 C)] 99.6 F (37.6 C) (01/09 1341) Pulse Rate:  [76-92] 87  (01/09 1341) Resp:  [18-34] 18  (01/09 1341) BP: (127-142)/(50-76) 134/66 mmHg (01/09 1341) SpO2:  [96 %-100 %] 98 % (01/09 1341)   General appearance: alert, cooperative and mild distress  Lab Results  Basename 01/02/13 0545 01/01/13 0600  WBC 7.1 7.8  HGB 7.6* 9.2*  HCT 25.0* 30.3*  NA 134* 134*  K 4.1 3.7  CL 98 98  CO2 23 25    BUN 10 10  CREATININE 0.64 0.65  GLU -- --   Liver Panel No results found for this basename: PROT:2,ALBUMIN:2,AST:2,ALT:2,ALKPHOS:2,BILITOT:2,BILIDIR:2,IBILI:2 in the last 72 hours Sedimentation Rate No results found for this basename: ESRSEDRATE in the last 72 hours C-Reactive Protein No results found for this basename: CRP:2 in the last 72 hours  Microbiology: Recent Results (from the past 240 hour(s))  URINE CULTURE     Status: Normal   Collection Time   12/28/12  1:10 AM      Component Value Range Status Comment   Specimen Description URINE, CLEAN CATCH   Final    Special Requests CX ADDED AT 0138 ON [id number]   Final    Culture  Setup Time 12/28/2012 11:16   Final    Colony Count >=100,000 COLONIES/ML   Final    Culture ENTEROCOCCUS SPECIES   Final    Report Status 12/30/2012 FINAL   Final    Organism ID, Bacteria ENTEROCOCCUS SPECIES   Final   CULTURE, BLOOD (ROUTINE X 2)     Status: Normal (  Preliminary result)   Collection Time   12/28/12  1:35 AM      Component Value Range Status Comment   Specimen Description BLOOD RIGHT ARM   Final    Special Requests BOTTLES DRAWN AEROBIC AND ANAEROBIC 3CC EACH   Final    Culture  Setup Time 12/28/2012 11:19   Final    Culture     Final    Value:        BLOOD CULTURE RECEIVED NO GROWTH TO DATE CULTURE WILL BE HELD FOR 5 DAYS BEFORE ISSUING A FINAL NEGATIVE REPORT   Report Status PENDING   Incomplete   CULTURE, BLOOD (ROUTINE X 2)     Status: Normal (Preliminary result)   Collection Time   12/28/12  1:50 AM      Component Value Range Status Comment   Specimen Description BLOOD LEFT ARM   Final    Special Requests BOTTLES DRAWN AEROBIC AND ANAEROBIC 5CC EACH   Final    Culture  Setup Time 12/29/2012 11:31   Final    Culture     Final    Value:        BLOOD CULTURE RECEIVED NO GROWTH TO DATE CULTURE WILL BE HELD FOR 5 DAYS BEFORE ISSUING A FINAL NEGATIVE REPORT   Report Status PENDING   Incomplete   WOUND CULTURE     Status: Normal  (Preliminary result)   Collection Time   01/01/13  6:22 PM      Component Value Range Status Comment   Specimen Description WOUND BACK   Final    Special Requests SPECIMEN NO 1 LEFT L5 SCREW CAVITY   Final    Gram Stain     Final    Value: NO WBC SEEN     NO SQUAMOUS EPITHELIAL CELLS SEEN     NO ORGANISMS SEEN   Culture PENDING   Incomplete    Report Status PENDING   Incomplete   GRAM STAIN     Status: Normal   Collection Time   01/01/13  6:22 PM      Component Value Range Status Comment   Specimen Description WOUND BACK   Final    Special Requests SPECIMEN NO 1 LEFT L5 SCREW CAVITY   Final    Gram Stain     Final    Value: RARE WBC PRESENT,BOTH PMN AND MONONUCLEAR     NO ORGANISMS SEEN   Report Status 01/01/2013 FINAL   Final   ANAEROBIC CULTURE     Status: Normal (Preliminary result)   Collection Time   01/01/13  6:22 PM      Component Value Range Status Comment   Specimen Description WOUND BACK   Final    Special Requests SPECIMEN NO 1 LEFT L5 SCREW CAVITY   Final    Gram Stain PENDING   Incomplete    Culture     Final    Value: NO ANAEROBES ISOLATED; CULTURE IN PROGRESS FOR 5 DAYS   Report Status PENDING   Incomplete   TISSUE CULTURE     Status: Normal (Preliminary result)   Collection Time   01/01/13  6:25 PM      Component Value Range Status Comment   Specimen Description TISSUE BONE   Final    Special Requests NO 2   Final    Gram Stain     Final    Value: FEW WBC PRESENT, PREDOMINANTLY MONONUCLEAR     NO ORGANISMS SEEN   Culture NO GROWTH  1 DAY   Final    Report Status PENDING   Incomplete   ANAEROBIC CULTURE     Status: Normal (Preliminary result)   Collection Time   01/01/13  6:25 PM      Component Value Range Status Comment   Specimen Description TISSUE BONE   Final    Special Requests NO 2   Final    Gram Stain PENDING   Incomplete    Culture     Final    Value: NO ANAEROBES ISOLATED; CULTURE IN PROGRESS FOR 5 DAYS   Report Status PENDING   Incomplete   GRAM  STAIN     Status: Normal   Collection Time   01/01/13  6:25 PM      Component Value Range Status Comment   Specimen Description TISSUE BONE   Final    Special Requests NO 2   Final    Gram Stain     Final    Value: FEW WBC PRESENT,BOTH PMN AND MONONUCLEAR     NO ORGANISMS SEEN   Report Status 01/01/2013 FINAL   Final   WOUND CULTURE     Status: Normal (Preliminary result)   Collection Time   01/01/13  6:25 PM      Component Value Range Status Comment   Specimen Description WOUND   Final    Special Requests NO 3 L5 SCREW MECHANISM   Final    Gram Stain     Final    Value: RARE WBC PRESENT,BOTH PMN AND MONONUCLEAR     NO SQUAMOUS EPITHELIAL CELLS SEEN     NO ORGANISMS SEEN   Culture NO GROWTH 1 DAY   Final    Report Status PENDING   Incomplete   ANAEROBIC CULTURE     Status: Normal (Preliminary result)   Collection Time   01/01/13  6:25 PM      Component Value Range Status Comment   Specimen Description WOUND   Final    Special Requests NO 3 L5 SCREW MECHANISM   Final    Gram Stain PENDING   Incomplete    Culture     Final    Value: NO ANAEROBES ISOLATED; CULTURE IN PROGRESS FOR 5 DAYS   Report Status PENDING   Incomplete   GRAM STAIN     Status: Normal   Collection Time   01/01/13  6:25 PM      Component Value Range Status Comment   Specimen Description WOUND   Final    Special Requests NO 3 SCREW MECHANISM   Final    Gram Stain     Final    Value: RARE WBC PRESENT,BOTH PMN AND MONONUCLEAR     NO ORGANISMS SEEN   Report Status 01/01/2013 FINAL   Final     Studies/Results: Dg Lumbar Spine 2-3 Views  01/01/2013  *RADIOLOGY REPORT*  Clinical Data: Lumbar hardware removal.  LUMBAR SPINE - 2-3 VIEW  Comparison: 10/24/2012.  01/17/2013.  Findings: Intraoperative localization demonstrates a posterior approach to rod and pedicle screw fixation from L3-L5.  The initial image demonstrates a probe over the dorsal aspect of the L5 rod and pedicle screw fixation.  The second image demonstrates  a probe dorsal to the L4-L5 interspace.  IMPRESSION: Intraoperative localization at L4-L5.   Original Report Authenticated By: Andreas Newport, M.D.      Assessment/Plan: Osteomyelitis, ost-op wound infection  UTI  DM2  Spoke with pt- not clear if his surgical space/back is infected. His Cx have  not been helpful. He did receive anbx prior to his surgery, which could lead to a false negative result.   Typically would recommend 6 weeks of anbx (vanco/ceftraixone/rifampin) for this such patient. Would ask for Dr Valentina Lucks and Dr Sueanne Margarita opinions as well.    Total days of antibiotics:4 (amoxil)         [name] Infectious Diseases [phone] 01/02/2013, 5:32 PM   LOS: 5 days

## 2013-01-02 NOTE — Progress Notes (Signed)
Pt has home CPAP.  

## 2013-01-02 NOTE — Progress Notes (Signed)
Pt. Wearing home CPAP at this time.

## 2013-01-02 NOTE — Progress Notes (Signed)
Patient says he feels like he is retaining urine.  He states that he has voided twice.  Bladder scanned patient which showed 0 ml urine in bladder.  Will continue to monitor.

## 2013-01-02 NOTE — Evaluation (Signed)
Physical Therapy Evaluation Patient Details Name: Kevin [name] MRN: [id number] DOB: 05-18-1946 Today's Date: 01/02/2013 Time: 4098-1191 PT Time Calculation (min): 26 min  PT Assessment / Plan / Recommendation Clinical Impression  Pt present with deficits in functional mobility secondary to pain, weakness, and decreased activity tolerance. Anticipate patient will continue to progress; rec HHPT upon discharge and continue to see acutely to address deficits and maximize independence.     PT Assessment  Patient needs continued PT services    Follow Up Recommendations  Home health PT (patient had difficulty in the past because HHPT never showed)    Does the patient have the potential to tolerate intense rehabilitation      Barriers to Discharge   steps to enter home    Equipment Recommendations  None recommended by PT    Recommendations for Other Services     Frequency Min 5X/week    Precautions / Restrictions Precautions Precautions: Back;Fall   Pertinent Vitals/Pain 6/10      Mobility  Bed Mobility Bed Mobility: Rolling Left;Left Sidelying to Sit;Sitting - Scoot to [place] of Bed Rolling Left: 4: Min guard Left Sidelying to Sit: 4: Min assist Sitting - Scoot to [place] of Bed: 4: Min guard Details for Bed Mobility Assistance: Assist to pull to sit secondary to increased pain Transfers Transfers: Sit to Stand;Stand to Sit Sit to Stand: 4: Min assist Stand to Sit: 4: Min guard Details for Transfer Assistance: VC's for hand placement; initially unsteady and manual hand placement for stability Ambulation/Gait Ambulation/Gait Assistance: 4: Min guard Ambulation Distance (Feet): 25 Feet Assistive device: Rolling walker Ambulation/Gait Assistance Details: patient very slow and with significant pain, better when moving then standing static Gait Pattern: Step-through pattern;Decreased stride length Gait velocity: decreased           PT Diagnosis: Difficulty walking;Acute  pain;Generalized weakness;Abnormality of gait  PT Problem List: Decreased strength;Decreased range of motion;Decreased activity tolerance;Decreased balance;Decreased mobility;Pain PT Treatment Interventions: DME instruction;Stair training;Gait training;Therapeutic activities;Functional mobility training;Therapeutic exercise;Patient/family education   PT Goals Acute Rehab PT Goals PT Goal Formulation: With patient/family Time For Goal Achievement: 01/09/13 Potential to Achieve Goals: Good Pt will go Supine/Side to Sit: with modified independence PT Goal: Supine/Side to Sit - Progress: Goal set today Pt will go Sit to Stand: with modified independence PT Goal: Sit to Stand - Progress: Goal set today Pt will go Stand to Sit: with modified independence PT Goal: Stand to Sit - Progress: Goal set today Pt will Ambulate: >150 feet;with modified independence PT Goal: Ambulate - Progress: Goal set today Pt will Go Up / Down Stairs: 3-5 stairs;with modified independence PT Goal: Up/Down Stairs - Progress: Goal set today  Visit Information  Last PT Received On: 01/02/13    Subjective Data  Subjective: "i can tell people I really have a screw loose" Patient Stated Goal: to get home   Prior Functioning  Home Living Lives With: Spouse Available Help at Discharge: Family Type of Home: House Home Access: Stairs to enter Secretary/administrator of Steps: 4 Entrance Stairs-Rails: Right Home Layout: Able to live on main level with bedroom/bathroom Bathroom Shower/Tub: Health visitor: Standard Home Adaptive Equipment: Walker - rolling;Straight cane;Shower chair with back Prior Function Level of Independence: Independent with assistive device(s) (SPC PRN) Able to Take Stairs?: Yes Driving: Yes Vocation: Retired Musician: No difficulties (wears glasses)    Cognition  Overall Cognitive Status: Appears within functional limits for tasks  assessed/performed Arousal/Alertness: Awake/alert Orientation Level: Appears intact for tasks  assessed;Oriented X4 / Intact Behavior During Session: [facility] for tasks performed    Extremity/Trunk Assessment Right Upper Extremity Assessment RUE ROM/Strength/Tone: [facility] for tasks assessed Left Upper Extremity Assessment LUE ROM/Strength/Tone: [facility] for tasks assessed Right Lower Extremity Assessment RLE ROM/Strength/Tone: [facility] for tasks assessed Left Lower Extremity Assessment LLE ROM/Strength/Tone: WFL for tasks assessed   Balance Balance Balance Assessed: Yes High Level Balance High Level Balance Activites: Side stepping;Direction changes;Backward walking;Turns;Head turns High Level Balance Comments: increased pain with higher level balance activities   End of Session PT - End of Session Equipment Utilized During Treatment: Gait belt Activity Tolerance: Patient limited by pain Patient left: in chair;with call bell/phone within reach;with nursing in room Nurse Communication: Mobility status;Patient requests pain meds  GP     Fabio Asa 01/02/2013, 4:42 PM  Charlotte Crumb, PT DPT  [phone]

## 2013-01-02 NOTE — Clinical Social Work Note (Signed)
Clinical Social Work   CSW reviewed chart. PT is recommending home health PT at discharge. CSW will update RNCM. CSW is signing off as no further needs identified. Please reconsult if a need arises prior to discharge.   Dede Query, MSW, [facility]

## 2013-01-03 LAB — BASIC METABOLIC PANEL
BUN: 11 mg/dL (ref 6–23)
CO2: 26 mEq/L (ref 19–32)
Calcium: 8.8 mg/dL (ref 8.4–10.5)
Chloride: 102 mEq/L (ref 96–112)
Creatinine, Ser: 0.71 mg/dL (ref 0.50–1.35)
GFR calc Af Amer: >90 mL/min (ref >90)
GFR calc non Af Amer: >90 mL/min (ref >90)
Glucose, Bld: 145 mg/dL — ABNORMAL HIGH (ref 70–99)
Potassium: 3.9 mEq/L (ref 3.5–5.1)
Sodium: 136 mEq/L (ref 135–145)

## 2013-01-03 LAB — GLUCOSE, CAPILLARY
Glucose-Capillary: 216 mg/dL — ABNORMAL HIGH (ref 70–99)
Glucose-Capillary: 237 mg/dL — ABNORMAL HIGH (ref 70–99)
Glucose-Capillary: 117 mg/dL — ABNORMAL HIGH (ref 70–99)
Glucose-Capillary: 111 mg/dL — ABNORMAL HIGH (ref 70–99)
Glucose-Capillary: 153 mg/dL — ABNORMAL HIGH (ref 70–99)

## 2013-01-03 LAB — CBC
HCT: 26.2 % — ABNORMAL LOW (ref 39.0–52.0)
Hemoglobin: 7.9 g/dL — ABNORMAL LOW (ref 13.0–17.0)
MCH: 23.8 pg — ABNORMAL LOW (ref 26.0–34.0)
MCHC: 30.2 g/dL (ref 30.0–36.0)
MCV: 78.9 fL (ref 78.0–100.0)
Platelets: 348 10*3/uL (ref 150–400)
RBC: 3.32 MIL/uL — ABNORMAL LOW (ref 4.22–5.81)
RDW: 17.7 % — ABNORMAL HIGH (ref 11.5–15.5)
WBC: 9.7 10*3/uL (ref 4.0–10.5)

## 2013-01-03 LAB — CULTURE, BLOOD (ROUTINE X 2): Culture: NO GROWTH

## 2013-01-03 LAB — SEDIMENTATION RATE: Sed Rate: 126 mm/hr — ABNORMAL HIGH (ref 0–16)

## 2013-01-03 MED ORDER — POLYETHYLENE GLYCOL 3350 17 G PO PACK
17.0000 g | PACK | Freq: Two times a day (BID) | ORAL | Status: DC
  Administered 2013-01-03 – 2013-01-07 (×7): 17 g via ORAL
  Filled 2013-01-03 (×11): qty 1

## 2013-01-03 NOTE — Progress Notes (Signed)
Patient ID: Kevin [name], male   DOB: July 27, 1946, 67 y.o.   MRN: [id number] BP 120/70  Pulse 87  Temp 98.8 F (37.1 C) (Oral)  Resp 18  Ht 6\' 2"  (1.88 m)  Wt 113.399 kg (250 lb)  BMI 32.10 kg/m2  SpO2 99% Alert and oriented  Feeling much better now than on admission Absolutely no infection as of yet Will send sed rate.

## 2013-01-03 NOTE — Care Management Note (Addendum)
    Page 1 of 2   01/06/2013     11:49:33 AM   CARE MANAGEMENT NOTE 01/06/2013  Patient:  Kevin [name],Kevin [name]   Account Number:  [id number]  Date Initiated:  01/03/2013  Documentation initiated by:  [facility]  Subjective/Objective Assessment:   admitted with UTI, possible osteomyelitis     Action/Plan:   PT eval- recommending HHPT   Anticipated DC Date:  01/04/2013   Anticipated DC Plan:  HOME W HOME HEALTH SERVICES      DC Planning Services  CM consult      Choice offered to / List presented to:  [name]-1 Patient        HH arranged  HH-2 PT  HH-1 RN  IV Antibiotics      HH agency  [facility]   Status of service:  Completed, signed off Medicare Important Message given?   (If response is "NO", the following Medicare IM given date fields will be blank) Date Medicare IM given:   Date Additional Medicare IM given:    Discharge Disposition:  HOME W HOME HEALTH SERVICES  Per UR Regulation:  Reviewed for med. necessity/level of care/duration of stay  If discussed at Long Length of Stay Meetings, dates discussed:    Comments:  01/06/13 HHRN added to [facility] order for IV antibitiotics for newly dx osteomyelitis. Contacted Kevin [name] at [facility] and set up[facility] and IV antibiotics with [name] possible today or 01/07/13, PICC to be placed today. Kevin Cree RN, BSN, [facility]   01/03/13 PT recommending HHPT. Spoke with patient about HHPT, he chose [facility] from [facility] agencies list. Kevin [name] from [facility] and set up [facility] PT. Anticipate [name] 01/04/13. No equipment needs identified. Kevin Cree RN, BSN, CCM

## 2013-01-03 NOTE — Evaluation (Signed)
Occupational Therapy Evaluation and Discharge Patient Details Name: Kevin [name] MRN: [id number] DOB: 31-Jan-1946 Today's Date: 01/03/2013 Time: 4098-1191 OT Time Calculation (min): 21 min  OT Assessment / Plan / Recommendation Clinical Impression  This 67 yo male admitted with osteomyelitis and s/p hardware removal presents to acute OT with all education completed. Acute OT to sign off.    OT Assessment  Patient does not need any further OT services    Follow Up Recommendations  No OT follow up       Equipment Recommendations  None recommended by OT          Precautions / Restrictions Precautions Precautions: Back;Fall Restrictions Weight Bearing Restrictions: No       ADL  Equipment Used: Rolling walker Transfers/Ambulation Related to ADLs: Pt Supervision for all. ADL Comments: Pt at this time is able to cross RLE over LLE to get to foot/sock; however he is unable to cross LLE over RLE to get to foot/sock (reports that his wife can A him prn). Talked to him about using wet wipes for bowel hygiene to make it easier to avoid twisting.        Visit Information  Last OT Received On: 01/03/13 Assistance Needed: +1    Subjective Data  Subjective: That makes sense (using wet wipes to clean after bowel movements)   Prior Functioning     Home Living Lives With: Spouse Available Help at Discharge: Family;Available PRN/intermittently Type of Home: House Home Access: Stairs to enter [employer] of Steps: 4 Entrance Stairs-Rails: Right Home Layout: Able to live on main level with bedroom/bathroom Bathroom Shower/Tub: Health visitor: Standard Bathroom Accessibility: Yes How Accessible: Accessible via walker Home Adaptive Equipment: Walker - rolling;Shower chair with back;Straight cane Prior Function Level of Independence: Independent with assistive device(s) (SPC prn) Able to Take Stairs?: Yes Driving: Yes Vocation:  Retired Musician: No difficulties Dominant Hand: Right            Cognition  Overall Cognitive Status: Appears within functional limits for tasks assessed/performed Arousal/Alertness: Awake/alert Orientation Level: Appears intact for tasks assessed Behavior During Session: [facility] for tasks performed    Extremity/Trunk Assessment Right Upper Extremity Assessment RUE ROM/Strength/Tone: Within functional levels Left Upper Extremity Assessment LUE ROM/Strength/Tone: Within functional levels     Mobility Bed Mobility Bed Mobility: Not assessed Transfers Transfers: Sit to Stand;Stand to Sit Sit to Stand: 5: Supervision;With upper extremity assist;From toilet Stand to Sit: 5: Supervision;With upper extremity assist;With armrests;To chair/3-in-1 Details for Transfer Assistance: VC's for hand placement; initially unsteady and manual hand placement for stability           Balance Balance Balance Assessed: Yes High Level Balance High Level Balance Activites: Side stepping;Direction changes;Backward walking;Turns;Head turns High Level Balance Comments: much more stable with higher level balance activities using rw   End of Session OT - End of Session Equipment Utilized During Treatment:  (RW) Activity Tolerance: Patient tolerated treatment well Patient left: in chair;with call bell/phone within reach (PT entering room)       Evette Georges [phone] 01/03/2013, 3:12 PM

## 2013-01-03 NOTE — Progress Notes (Signed)
INFECTIOUS DISEASE PROGRESS NOTE  ID: Kevin [name] is a 67 y.o. male with   Principal Problem:  *UTI (lower urinary tract infection) Active Problems:  DIABETES MELLITUS  HYPERLIPIDEMIA  OBESITY, MORBID  HYPERTENSION  SLEEP APNEA  Encephalopathy acute  Subjective: (Not seen)  Abtx:  Anti-infectives     Start     Dose/Rate Route Frequency Ordered Stop   12/30/12 1500   amoxicillin (AMOXIL) capsule 500 mg        500 mg Oral 3 times per day 12/30/12 1415     12/28/12 1100   cefTRIAXone (ROCEPHIN) 1 g in dextrose 5 % 50 mL IVPB  Status:  Discontinued        1 g 100 mL/hr over 30 Minutes Intravenous Every 24 hours 12/28/12 0853 12/30/12 1414   12/28/12 0230   cefTRIAXone (ROCEPHIN) 1 g in dextrose 5 % 50 mL IVPB        1 g 100 mL/hr over 30 Minutes Intravenous  Once 12/28/12 0220 12/28/12 0415          Medications:  Scheduled:   . amLODipine  5 mg Oral Daily  . amoxicillin  500 mg Oral Q8H  . ferrous sulfate  325 mg Oral BID WC  . furosemide  40 mg Oral Daily  . insulin aspart  0-15 Units Subcutaneous TID WC  . insulin aspart  0-5 Units Subcutaneous QHS  . insulin glargine  80 Units Subcutaneous QHS  . ketorolac  30 mg Intravenous Q6H  . losartan  100 mg Oral Daily  . pantoprazole  40 mg Oral Daily  . polyethylene glycol  17 g Oral BID  . potassium chloride SA  20 mEq Oral BID  . senna-docusate  2 tablet Oral QHS  . simvastatin  20 mg Oral QPM  . sodium chloride  3 mL Intravenous Q12H    Objective: Vital signs in last 24 hours: Temp:  [97.5 F (36.4 C)-99.7 F (37.6 C)] 99 F (37.2 C) (01/10 1439) Pulse Rate:  [74-92] 87  (01/10 1439) Resp:  [18] 18  (01/10 1439) BP: (117-142)/(56-70) 117/56 mmHg (01/10 1439) SpO2:  [98 %-100 %] 99 % (01/10 1439)   not seen  Lab Results  Basename 01/03/13 0440 01/02/13 0545  WBC 9.7 7.1  HGB 7.9* 7.6*  HCT 26.2* 25.0*  NA 136 134*  K 3.9 4.1  CL 102 98  CO2 26 23  BUN 11 10  CREATININE 0.71 0.64  GLU --  --   Liver Panel No results found for this basename: PROT:2,ALBUMIN:2,AST:2,ALT:2,ALKPHOS:2,BILITOT:2,BILIDIR:2,IBILI:2 in the last 72 hours Sedimentation Rate No results found for this basename: ESRSEDRATE in the last 72 hours C-Reactive Protein No results found for this basename: CRP:2 in the last 72 hours  Microbiology: Recent Results (from the past 240 hour(s))  URINE CULTURE     Status: Normal   Collection Time   12/28/12  1:10 AM      Component Value Range Status Comment   Specimen Description URINE, CLEAN CATCH   Final    Special Requests CX ADDED AT 0138 ON [id number]   Final    Culture  Setup Time 12/28/2012 11:16   Final    Colony Count >=100,000 COLONIES/ML   Final    Culture ENTEROCOCCUS SPECIES   Final    Report Status 12/30/2012 FINAL   Final    Organism ID, Bacteria ENTEROCOCCUS SPECIES   Final   CULTURE, BLOOD (ROUTINE X 2)     Status: Normal  Collection Time   12/28/12  1:35 AM      Component Value Range Status Comment   Specimen Description BLOOD RIGHT ARM   Final    Special Requests BOTTLES DRAWN AEROBIC AND ANAEROBIC 3CC EACH   Final    Culture  Setup Time 12/28/2012 11:19   Final    Culture NO GROWTH 5 DAYS   Final    Report Status 01/03/2013 FINAL   Final   CULTURE, BLOOD (ROUTINE X 2)     Status: Normal (Preliminary result)   Collection Time   12/28/12  1:50 AM      Component Value Range Status Comment   Specimen Description BLOOD LEFT ARM   Final    Special Requests BOTTLES DRAWN AEROBIC AND ANAEROBIC 5CC EACH   Final    Culture  Setup Time 12/29/2012 11:31   Final    Culture     Final    Value:        BLOOD CULTURE RECEIVED NO GROWTH TO DATE CULTURE WILL BE HELD FOR 5 DAYS BEFORE ISSUING A FINAL NEGATIVE REPORT   Report Status PENDING   Incomplete   WOUND CULTURE     Status: Normal (Preliminary result)   Collection Time   01/01/13  6:22 PM      Component Value Range Status Comment   Specimen Description WOUND BACK   Final    Special Requests SPECIMEN NO 1  LEFT L5 SCREW CAVITY   Final    Gram Stain     Final    Value: NO WBC SEEN     NO SQUAMOUS EPITHELIAL CELLS SEEN     NO ORGANISMS SEEN   Culture NO GROWTH 1 DAY   Final    Report Status PENDING   Incomplete   GRAM STAIN     Status: Normal   Collection Time   01/01/13  6:22 PM      Component Value Range Status Comment   Specimen Description WOUND BACK   Final    Special Requests SPECIMEN NO 1 LEFT L5 SCREW CAVITY   Final    Gram Stain     Final    Value: RARE WBC PRESENT,BOTH PMN AND MONONUCLEAR     NO ORGANISMS SEEN   Report Status 01/01/2013 FINAL   Final   ANAEROBIC CULTURE     Status: Normal (Preliminary result)   Collection Time   01/01/13  6:22 PM      Component Value Range Status Comment   Specimen Description WOUND BACK   Final    Special Requests SPECIMEN NO 1 LEFT L5 SCREW CAVITY   Final    Gram Stain PENDING   Incomplete    Culture     Final    Value: NO ANAEROBES ISOLATED; CULTURE IN PROGRESS FOR 5 DAYS   Report Status PENDING   Incomplete   TISSUE CULTURE     Status: Normal (Preliminary result)   Collection Time   01/01/13  6:25 PM      Component Value Range Status Comment   Specimen Description TISSUE BONE   Final    Special Requests NO 2   Final    Gram Stain     Final    Value: FEW WBC PRESENT, PREDOMINANTLY MONONUCLEAR     NO ORGANISMS SEEN   Culture NO GROWTH 2 DAYS   Final    Report Status PENDING   Incomplete   ANAEROBIC CULTURE     Status: Normal (Preliminary result)  Collection Time   01/01/13  6:25 PM      Component Value Range Status Comment   Specimen Description TISSUE BONE   Final    Special Requests NO 2   Final    Gram Stain PENDING   Incomplete    Culture     Final    Value: NO ANAEROBES ISOLATED; CULTURE IN PROGRESS FOR 5 DAYS   Report Status PENDING   Incomplete   GRAM STAIN     Status: Normal   Collection Time   01/01/13  6:25 PM      Component Value Range Status Comment   Specimen Description TISSUE BONE   Final    Special Requests NO 2    Final    Gram Stain     Final    Value: FEW WBC PRESENT,BOTH PMN AND MONONUCLEAR     NO ORGANISMS SEEN   Report Status 01/01/2013 FINAL   Final   WOUND CULTURE     Status: Normal (Preliminary result)   Collection Time   01/01/13  6:25 PM      Component Value Range Status Comment   Specimen Description WOUND   Final    Special Requests NO 3 L5 SCREW MECHANISM   Final    Gram Stain     Final    Value: RARE WBC PRESENT,BOTH PMN AND MONONUCLEAR     NO SQUAMOUS EPITHELIAL CELLS SEEN     NO ORGANISMS SEEN   Culture NO GROWTH 2 DAYS   Final    Report Status PENDING   Incomplete   ANAEROBIC CULTURE     Status: Normal (Preliminary result)   Collection Time   01/01/13  6:25 PM      Component Value Range Status Comment   Specimen Description WOUND   Final    Special Requests NO 3 L5 SCREW MECHANISM   Final    Gram Stain PENDING   Incomplete    Culture     Final    Value: NO ANAEROBES ISOLATED; CULTURE IN PROGRESS FOR 5 DAYS   Report Status PENDING   Incomplete   GRAM STAIN     Status: Normal   Collection Time   01/01/13  6:25 PM      Component Value Range Status Comment   Specimen Description WOUND   Final    Special Requests NO 3 SCREW MECHANISM   Final    Gram Stain     Final    Value: RARE WBC PRESENT,BOTH PMN AND MONONUCLEAR     NO ORGANISMS SEEN   Report Status 01/01/2013 FINAL   Final     Studies/Results: Dg Lumbar Spine 2-3 Views  01/01/2013  *RADIOLOGY REPORT*  Clinical Data: Lumbar hardware removal.  LUMBAR SPINE - 2-3 VIEW  Comparison: 10/24/2012.  01/17/2013.  Findings: Intraoperative localization demonstrates a posterior approach to rod and pedicle screw fixation from L3-L5.  The initial image demonstrates a probe over the dorsal aspect of the L5 rod and pedicle screw fixation.  The second image demonstrates a probe dorsal to the L4-L5 interspace.  IMPRESSION: Intraoperative localization at L4-L5.   Original Report Authenticated By: Andreas Newport, M.D.       Assessment/Plan: UTI ? Osteomyelitis/post-op wound infection Total days of antibiotics 5 (amoxil)  Discussed with Dr Valentina Lucks, will watch his Cx's. Watch him clinically as his Cx have been unrevealing. Will see him back in ID clinic in ~ 3weeks to f/u         Kevin [name]  Infectious Diseases [phone] 01/03/2013, 3:17 PM   LOS: 6 days

## 2013-01-03 NOTE — Progress Notes (Signed)
Physical Therapy Treatment Patient Details Name: Kevin [name] MRN: [id number] DOB: 30-Jan-1946 Today's Date: 01/03/2013 Time: 4098-1191 PT Time Calculation (min): 25 min  PT Assessment / Plan / Recommendation Comments on Treatment Session  Patient continues to make steady progress towards PT goals at this time.  Pt still limited by significant pain will need to perform stair negotiation training prior to discharge home.     Follow Up Recommendations  Home health PT (patient had difficulty in the past because HHPT never showed)     Does the patient have the potential to tolerate intense rehabilitation     Barriers to Discharge        Equipment Recommendations  None recommended by PT    Recommendations for Other Services    Frequency Min 5X/week   Plan Discharge plan remains appropriate    Precautions / Restrictions Precautions Precautions: Back;Fall   Pertinent Vitals/Pain 6/10    Mobility  Bed Mobility Bed Mobility: Not assessed Transfers Transfers: Sit to Stand;Stand to Sit Sit to Stand: 4: Min assist Stand to Sit: 4: Min guard Details for Transfer Assistance: VC's for hand placement; initially unsteady and manual hand placement for stability Ambulation/Gait Ambulation/Gait Assistance: 5: Supervision Ambulation Distance (Feet): 80 Feet Assistive device: Rolling walker Ambulation/Gait Assistance Details: Patient steady but very rigid secondary to pain  Gait Pattern: Step-through pattern;Decreased stride length Gait velocity: decreased      PT Goals Acute Rehab PT Goals PT Goal Formulation: With patient/family Time For Goal Achievement: 01/09/13 Potential to Achieve Goals: Good Pt will go Supine/Side to Sit: with modified independence Pt will go Sit to Stand: with modified independence PT Goal: Sit to Stand - Progress: Progressing toward goal Pt will go Stand to Sit: with modified independence PT Goal: Stand to Sit - Progress: Progressing toward goal Pt will  Ambulate: >150 feet;with modified independence PT Goal: Ambulate - Progress: Progressing toward goal Pt will Go Up / Down Stairs: 3-5 stairs;with modified independence  Visit Information  Last PT Received On: 01/03/13 Assistance Needed: +1    Subjective Data  Subjective: I'm ready for you Patient Stated Goal: to get home   Cognition  Overall Cognitive Status: Appears within functional limits for tasks assessed/performed Arousal/Alertness: Awake/alert Orientation Level: Appears intact for tasks assessed;Oriented X4 / Intact Behavior During Session: [facility] for tasks performed    Balance  Balance Balance Assessed: Yes High Level Balance High Level Balance Activites: Side stepping;Direction changes;Backward walking;Turns;Head turns High Level Balance Comments: much more stable with higher level balance activities using rw  End of Session PT - End of Session Equipment Utilized During Treatment: Gait belt Activity Tolerance: Patient limited by pain Patient left: in chair;with call bell/phone within reach;with nursing in room Nurse Communication: Mobility status;Patient requests pain meds   GP     Fabio Asa 01/03/2013, 12:13 PM Charlotte Crumb, PT DPT  [phone]

## 2013-01-03 NOTE — Progress Notes (Signed)
Subjective: No BM in 48 hours.  Back pain improved  Objective: Vital signs in last 24 hours: Temp:  [97.4 F (36.3 C)-99.7 F (37.6 C)] 97.5 F (36.4 C) (01/10 0600) Pulse Rate:  [74-92] 74  (01/10 0600) Resp:  [18] 18  (01/10 0600) BP: (117-142)/(63-76) 117/63 mmHg (01/10 0600) SpO2:  [98 %-100 %] 100 % (01/10 0600) Weight change:  Last BM Date: 12/31/12  Intake/Output from previous day: 01/09 0701 - 01/10 0700 In: 1872.5 [P.O.:1020; I.V.:852.5] Out: 1280 [Urine:1280] Intake/Output this shift:    General appearance: alert and cooperative Resp: clear to auscultation bilaterally Cardio: regular rate and rhythm, S1, S2 normal, no murmur, click, rub or gallop  Lab Results:  Basename 01/03/13 0440 01/02/13 0545  WBC 9.7 7.1  HGB 7.9* 7.6*  HCT 26.2* 25.0*  PLT 348 283   BMET  Basename 01/03/13 0440 01/02/13 0545  NA 136 134*  K 3.9 4.1  CL 102 98  CO2 26 23  GLUCOSE 145* 157*  BUN 11 10  CREATININE 0.71 0.64  CALCIUM 8.8 8.9    Studies/Results: Dg Lumbar Spine 2-3 Views  01/01/2013  *RADIOLOGY REPORT*  Clinical Data: Lumbar hardware removal.  LUMBAR SPINE - 2-3 VIEW  Comparison: 10/24/2012.  01/17/2013.  Findings: Intraoperative localization demonstrates a posterior approach to rod and pedicle screw fixation from L3-L5.  The initial image demonstrates a probe over the dorsal aspect of the L5 rod and pedicle screw fixation.  The second image demonstrates a probe dorsal to the L4-L5 interspace.  IMPRESSION: Intraoperative localization at L4-L5.   Original Report Authenticated By: Andreas Newport, M.D.     Medications: I have reviewed the patient's current medications.  Assessment/Plan: Present on Admission:  ID appears he may not have vertebral infection, cultures no growth (but on antibiotics).  Discuss with Drs. Hatcher and [place], ?treat UTI for 10 days and then follow clinically Urinary tract infection - enterococcus.. Blood cultures x2 NG so far. On amoxicillin  day 5  DIABETES MELLITUS -controlled -lantus and sliding scale insulin  HYPERTENSION - controlled  SLEEP APNEA - continue CPAP  Anemia,microcytic,started iron twice a day, hgb stable at 7.9, no transfusion Pain Control improved, advised to cut back on narcotics if possible, no BM 48 hrs, give miralax bid Disposition possible discharge on 1/11with home PT    LOS: 6 days   Kevin [name] 01/03/2013, 7:59 AM

## 2013-01-04 LAB — WOUND CULTURE
Culture: NO GROWTH
Culture: NO GROWTH
Gram Stain: NONE SEEN

## 2013-01-04 LAB — BASIC METABOLIC PANEL
BUN: 10 mg/dL (ref 6–23)
CO2: 24 mEq/L (ref 19–32)
Calcium: 9 mg/dL (ref 8.4–10.5)
Chloride: 102 mEq/L (ref 96–112)
Creatinine, Ser: 0.72 mg/dL (ref 0.50–1.35)
GFR calc Af Amer: >90 mL/min (ref >90)
GFR calc non Af Amer: >90 mL/min (ref >90)
Glucose, Bld: 46 mg/dL — ABNORMAL LOW (ref 70–99)
Potassium: 3.8 mEq/L (ref 3.5–5.1)
Sodium: 136 mEq/L (ref 135–145)

## 2013-01-04 LAB — CULTURE, BLOOD (ROUTINE X 2): Culture: NO GROWTH

## 2013-01-04 LAB — CBC
HCT: 24.4 % — ABNORMAL LOW (ref 39.0–52.0)
Hemoglobin: 7.4 g/dL — ABNORMAL LOW (ref 13.0–17.0)
MCH: 23.8 pg — ABNORMAL LOW (ref 26.0–34.0)
MCHC: 30.3 g/dL (ref 30.0–36.0)
MCV: 78.5 fL (ref 78.0–100.0)
Platelets: 355 10*3/uL (ref 150–400)
RBC: 3.11 MIL/uL — ABNORMAL LOW (ref 4.22–5.81)
RDW: 17.6 % — ABNORMAL HIGH (ref 11.5–15.5)
WBC: 8.8 10*3/uL (ref 4.0–10.5)

## 2013-01-04 LAB — GLUCOSE, CAPILLARY
Glucose-Capillary: 141 mg/dL — ABNORMAL HIGH (ref 70–99)
Glucose-Capillary: 61 mg/dL — ABNORMAL LOW (ref 70–99)
Glucose-Capillary: 66 mg/dL — ABNORMAL LOW (ref 70–99)
Glucose-Capillary: 117 mg/dL — ABNORMAL HIGH (ref 70–99)
Glucose-Capillary: 132 mg/dL — ABNORMAL HIGH (ref 70–99)
Glucose-Capillary: 107 mg/dL — ABNORMAL HIGH (ref 70–99)
Glucose-Capillary: 115 mg/dL — ABNORMAL HIGH (ref 70–99)

## 2013-01-04 MED ORDER — AMOXICILLIN 500 MG PO CAPS: 500.0000 mg | ORAL_CAPSULE | Freq: Three times a day (TID) | ORAL | Status: DC

## 2013-01-04 MED ORDER — POLYETHYLENE GLYCOL 3350 17 G PO PACK: 17.0000 g | PACK | Freq: Every day | ORAL | Status: DC | PRN

## 2013-01-04 MED ORDER — FERROUS SULFATE 325 (65 FE) MG PO TABS: 325.0000 mg | ORAL_TABLET | Freq: Every day | ORAL | Status: DC

## 2013-01-04 MED ORDER — OXYCODONE-ACETAMINOPHEN 10-325 MG PO TABS: 1.0000 | ORAL_TABLET | ORAL | Status: DC | PRN

## 2013-01-04 MED ORDER — SENNOSIDES-DOCUSATE SODIUM 8.6-50 MG PO TABS: 2.0000 | ORAL_TABLET | Freq: Every day | ORAL | Status: DC

## 2013-01-04 NOTE — Progress Notes (Signed)
Subjective: Severe back pain this morning while walking, had bowel movement yesterday  Objective: Vital signs in last 24 hours: Temp:  [98.8 F (37.1 C)-100 F (37.8 C)] 98.8 F (37.1 C) (01/11 1023) Pulse Rate:  [76-87] 76  (01/11 1023) Resp:  [18-20] 18  (01/11 1023) BP: (117-152)/(56-77) 130/71 mmHg (01/11 1023) SpO2:  [97 %-100 %] 99 % (01/11 1023) Weight change:  Last BM Date: 01/04/13  Intake/Output from previous day: 01/10 0701 - 01/11 0700 In: -  Out: 600 [Urine:600] Intake/Output this shift:    General appearance: alert and cooperative  Lab Results:  Basename 01/04/13 0500 01/03/13 0440  WBC 8.8 9.7  HGB 7.4* 7.9*  HCT 24.4* 26.2*  PLT 355 348   BMET  Basename 01/04/13 0500 01/03/13 0440  NA 136 136  K 3.8 3.9  CL 102 102  CO2 24 26  GLUCOSE 46* 145*  BUN 10 11  CREATININE 0.72 0.71  CALCIUM 9.0 8.8    Studies/Results: No results found.  Medications: I have reviewed the patient's current medications.  Assessment/Plan: Present on Admission:  ID appears he may not have vertebral infection, cultures no growth (but on antibiotics). Discussed with Drs. Hatcher and [employer], treat UTI for 10 days and then follow clinically  Urinary tract infection - enterococcus.. Blood cultures x2 NG so far. On amoxicillin day 6  DIABETES MELLITUS -controlled -lantus and sliding scale insulin  HYPERTENSION - controlled  SLEEP APNEA - continue CPAP  Anemia,microcytic,started iron twice a day, hgb stable at 7.9, no transfusion, home in iron qd Pain Control improved, advised to cut back on narcotics if possible, had BM yesterday, on miralax and senekot s Disposition possible discharge on 1/12with home PT.  Pain was to great today in back for him to safely go home today and negotiate staircase into his home.    LOS: 7 days   Kevin [name] 01/04/2013, 12:51 PM

## 2013-01-04 NOTE — Progress Notes (Signed)
Physical Therapy Treatment Patient Details Name: Kevin [name] MRN: [id number] DOB: 1946-07-20 Today's Date: 01/04/2013 Time: 4098-1191 PT Time Calculation (min): 51 min  PT Assessment / Plan / Recommendation Comments on Treatment Session  Pt cont's to report significant pain in back making mobility very challenging for him.   At end of session, pt states he feels as though he needs to stay 1 more day in hospital before d/cing home to get pain better controlled.  MD is aware.      Follow Up Recommendations  Home health PT     Does the patient have the potential to tolerate intense rehabilitation     Barriers to Discharge        Equipment Recommendations  None recommended by PT    Recommendations for Other Services    Frequency Min 5X/week   Plan Discharge plan remains appropriate    Precautions / Restrictions Precautions Precautions: Back;Fall Restrictions Weight Bearing Restrictions: No       Mobility  Bed Mobility Bed Mobility: Rolling Left;Left Sidelying to Sit;Sitting - Scoot to Edge of Bed Rolling Left: 4: Min guard;With rail Left Sidelying to Sit: 4: Min guard;HOB flat;With rails Sitting - Scoot to Edge of Bed: 4: Min guard Details for Bed Mobility Assistance: Increased time.  appears effortful.   Transfers Transfers: Sit to Stand;Stand to Sit Sit to Stand: With upper extremity assist;With armrests;From bed;From chair/3-in-1;From elevated surface;4: Min assist;3: Mod assist Stand to Sit: 4: Min assist;With upper extremity assist;With armrests;To chair/3-in-1 Details for Transfer Assistance: Pt required mod (A) to achieve standing when starting with hands from seated surface compared to Min (A) to stand from seat when starting with UE's on RW & pulling himself up.  Educated pt & pt's wife that if he has to use technique with UE's on RW to start with then RW needs to be stabilized.   Ambulation/Gait Ambulation/Gait Assistance: 4: Min guard Ambulation Distance  (Feet): 20 Feet Assistive device: Rolling walker Ambulation/Gait Assistance Details: Guarding for safety due to pain.  Distance limited by pain.  Heavy reliance of UE's on RW.  Effortful.   Gait Pattern: Step-through pattern;Decreased stride length Gait velocity: decreased Stairs: Yes Stairs Assistance: 4: Min assist Stairs Assistance Details (indicate cue type and reason): Cues to reinforce back precautions & technique.   Stair Management Technique: One rail Left;Step to pattern;Sideways Number of Stairs: 4  Wheelchair Mobility Wheelchair Mobility: No      PT Goals Acute Rehab PT Goals Time For Goal Achievement: 01/09/13 Potential to Achieve Goals: Good Pt will go Supine/Side to Sit: with modified independence PT Goal: Supine/Side to Sit - Progress: Progressing toward goal Pt will go Sit to Stand: with modified independence PT Goal: Sit to Stand - Progress: Not met Pt will go Stand to Sit: with modified independence PT Goal: Stand to Sit - Progress: Not met Pt will Ambulate: >150 feet;with modified independence PT Goal: Ambulate - Progress: Not met Pt will Go Up / Down Stairs: 3-5 stairs;with modified independence PT Goal: Up/Down Stairs - Progress: Progressing toward goal  Visit Information  Last PT Received On: 01/04/13 Assistance Needed: +1    Subjective Data      Cognition  Overall Cognitive Status: Appears within functional limits for tasks assessed/performed Arousal/Alertness: Awake/alert Orientation Level: Appears intact for tasks assessed Behavior During Session: [facility] for tasks performed    Balance     End of Session PT - End of Session Equipment Utilized During Treatment: Gait belt Activity Tolerance: Patient limited  by pain Patient left: in chair;with call bell/phone within reach;with family/visitor present Nurse Communication: Mobility status;Patient requests pain meds     Verdell Face, [place] [phone] 01/04/2013

## 2013-01-04 NOTE — Progress Notes (Signed)
RT Note: Pt has their home CPAP.

## 2013-01-05 LAB — GLUCOSE, CAPILLARY
Glucose-Capillary: 54 mg/dL — ABNORMAL LOW (ref 70–99)
Glucose-Capillary: 132 mg/dL — ABNORMAL HIGH (ref 70–99)
Glucose-Capillary: 95 mg/dL (ref 70–99)
Glucose-Capillary: 181 mg/dL — ABNORMAL HIGH (ref 70–99)
Glucose-Capillary: 205 mg/dL — ABNORMAL HIGH (ref 70–99)

## 2013-01-05 LAB — BASIC METABOLIC PANEL
BUN: 9 mg/dL (ref 6–23)
CO2: 25 mEq/L (ref 19–32)
Calcium: 9.1 mg/dL (ref 8.4–10.5)
Chloride: 99 mEq/L (ref 96–112)
Creatinine, Ser: 0.65 mg/dL (ref 0.50–1.35)
GFR calc Af Amer: >90 mL/min (ref >90)
GFR calc non Af Amer: >90 mL/min (ref >90)
Glucose, Bld: 53 mg/dL — ABNORMAL LOW (ref 70–99)
Potassium: 3.6 mEq/L (ref 3.5–5.1)
Sodium: 135 mEq/L (ref 135–145)

## 2013-01-05 LAB — CBC
HCT: 25.8 % — ABNORMAL LOW (ref 39.0–52.0)
Hemoglobin: 7.9 g/dL — ABNORMAL LOW (ref 13.0–17.0)
MCH: 23.9 pg — ABNORMAL LOW (ref 26.0–34.0)
MCHC: 30.6 g/dL (ref 30.0–36.0)
MCV: 77.9 fL — ABNORMAL LOW (ref 78.0–100.0)
Platelets: 404 10*3/uL — ABNORMAL HIGH (ref 150–400)
RBC: 3.31 MIL/uL — ABNORMAL LOW (ref 4.22–5.81)
RDW: 17.4 % — ABNORMAL HIGH (ref 11.5–15.5)
WBC: 8.7 10*3/uL (ref 4.0–10.5)

## 2013-01-05 MED ORDER — DEXTROSE 5 % IV SOLN
2.0000 g | Freq: Two times a day (BID) | INTRAVENOUS | Status: DC
  Administered 2013-01-05 – 2013-01-07 (×5): 2 g via INTRAVENOUS
  Filled 2013-01-05 (×7): qty 2

## 2013-01-05 NOTE — Progress Notes (Signed)
Subjective: More pain today.  Unable to walk as far as yesterday.  Not comfortable sitting up in chair. Patient and wife very upset about news of positive culture results.  Objective: Vital signs in last 24 hours: Temp:  [97.7 F (36.5 C)-100.6 F (38.1 C)] 98.8 F (37.1 C) (01/12 0941) Pulse Rate:  [78-86] 82  (01/12 0941) Resp:  [18] 18  (01/12 0941) BP: (132-150)/(62-80) 137/76 mmHg (01/12 0941) SpO2:  [96 %-100 %] 96 % (01/12 0941) Weight change:  Last BM Date: 01/04/13  Intake/Output from previous day: 01/11 0701 - 01/12 0700 In: -  Out: 200 [Urine:200] Intake/Output this shift:    General appearance: alert, cooperative and mild distress  Lab Results:  Basename 01/05/13 0655 01/04/13 0500  WBC 8.7 8.8  HGB 7.9* 7.4*  HCT 25.8* 24.4*  PLT 404* 355   BMET  Basename 01/05/13 0655 01/04/13 0500  NA 135 136  K 3.6 3.8  CL 99 102  CO2 25 24  GLUCOSE 53* 46*  BUN 9 10  CREATININE 0.65 0.72  CALCIUM 9.1 9.0    Studies/Results: No results found.  Medications:  I have reviewed the patient's current medications. Prior to Admission:  Prescriptions prior to admission  Medication Sig Dispense Refill  . ACAI PO Take 1 capsule by mouth daily.      Marland Kitchen amLODipine (NORVASC) 10 MG tablet Take 5 mg by mouth daily.       Marland Kitchen aspirin 325 MG tablet Take 325 mg by mouth daily.      . cyclobenzaprine (FLEXERIL) 10 MG tablet Take 10 mg by mouth 3 (three) times daily as needed. For pain      . furosemide (LASIX) 40 MG tablet Take 40 mg by mouth daily.      . insulin aspart (NOVOLOG) 100 UNIT/ML injection Inject 40 Units into the skin 3 (three) times daily before meals.      . insulin glargine (LANTUS) 100 UNIT/ML injection Inject 80 Units into the skin at bedtime.      . metFORMIN (GLUCOPHAGE) 1000 MG tablet Take 1,000-1,500 mg by mouth 2 (two) times daily with a meal. 1000mg  in the morning; 1500mg  in the evening      . Multiple Vitamin (MULTIVITAMIN WITH MINERALS) TABS Take 1  tablet by mouth daily.      . Nutritional Supplements (PYCNOGENOL) 154-25 MG CAPS Take 1 capsule by mouth daily.      Marland Kitchen omeprazole (PRILOSEC) 40 MG capsule Take 40 mg by mouth 2 (two) times daily.      . potassium chloride SA (K-DUR,KLOR-CON) 20 MEQ tablet Take 20 mEq by mouth daily.      Marland Kitchen RESVERATROL 100 MG CAPS Take 1 capsule by mouth daily.      . simvastatin (ZOCOR) 20 MG tablet Take 20 mg by mouth every evening.      . tadalafil (CIALIS) 20 MG tablet Take 20 mg by mouth daily as needed.      . valsartan (DIOVAN) 160 MG tablet Take 160 mg by mouth daily.      . [DISCONTINUED] oxyCODONE-acetaminophen (PERCOCET) 10-325 MG per tablet Take 1 tablet by mouth every 4 (four) hours as needed. For pain      . losartan (COZAAR) 100 MG tablet Take 100 mg by mouth daily.       Scheduled:   . amLODipine  5 mg Oral Daily  . amoxicillin  500 mg Oral Q8H  . ferrous sulfate  325 mg Oral BID WC  .  furosemide  40 mg Oral Daily  . insulin aspart  0-15 Units Subcutaneous TID WC  . insulin aspart  0-5 Units Subcutaneous QHS  . insulin glargine  80 Units Subcutaneous QHS  . ketorolac  30 mg Intravenous Q6H  . losartan  100 mg Oral Daily  . pantoprazole  40 mg Oral Daily  . polyethylene glycol  17 g Oral BID  . potassium chloride SA  20 mEq Oral BID  . senna-docusate  2 tablet Oral QHS  . simvastatin  20 mg Oral QPM  . sodium chloride  3 mL Intravenous Q12H   Continuous:   . sodium chloride     WUJ:WJXB & mag hydroxide-simeth, cyclobenzaprine, menthol-cetylpyridinium, morphine injection, ondansetron (ZOFRAN) IV, ondansetron (ZOFRAN) IV, ondansetron, oxyCODONE, oxyCODONE-acetaminophen, phenol, sodium chloride  Assessment/Plan: 1) osteomyelitis: bone tissue culture positive for pseudomonas.  ID aware and will see for antibiotic recommendations. PICC line will be needed for long course of IV antibiotics.  Patient and wife distressed by this news. Have many questions about how this occurred, where the  bacteria came from, how it got to the bone, why it took so long to manifest. 2) UTI: enterococcus. On d 8 of antibiotics. No further dysuria or lower abdominal symptoms.  Defer antibiotic regimen to ID. 3) diabetes mellitus: blood sugars reviewed. Well controled 4) Hypertension: good control. No changes 5) pain management: pain not controled today. Receiving percocet 2 tabs every 6 hours and Toradol 30 mg. Will increase Toradol dose 6) sleep apnea: continue with CPAP 7) anemia: progressive in hospital. Now on iron. Continue to follow as HBG is fairly low.   LOS: 8 days   Kevin [name] 01/05/2013, 11:26 AM

## 2013-01-05 NOTE — Progress Notes (Signed)
PT Cancellation Note  Patient Details Name: Kevin [name] MRN: [id number] DOB: November 26, 1946   Cancelled Treatment:    Reason Eval/Treat Not Completed: Other (comment)   Politely declining therapy today as he is in more pain today, and just found out cultures came back positive; Advised pt to call staff when getting up;  Will follow-up tomorrow;    Van Clines [facility] 01/05/2013, 11:13 AM

## 2013-01-05 NOTE — Progress Notes (Signed)
INFECTIOUS DISEASE PROGRESS NOTE  ID: INMAN FETTIG is a 67 y.o. male with   Principal Problem:  *UTI (lower urinary tract infection) Active Problems:  DIABETES MELLITUS  HYPERLIPIDEMIA  OBESITY, MORBID  HYPERTENSION  SLEEP APNEA  Encephalopathy acute  Subjective: Multiple questions about his infection, process going forward.   Abtx:  Anti-infectives     Start     Dose/Rate Route Frequency Ordered Stop   01/04/13 0000   amoxicillin (AMOXIL) 500 MG capsule        500 mg Oral Every 8 hours 01/04/13 1108     12/30/12 1500   amoxicillin (AMOXIL) capsule 500 mg        500 mg Oral 3 times per day 12/30/12 1415     12/28/12 1100   cefTRIAXone (ROCEPHIN) 1 g in dextrose 5 % 50 mL IVPB  Status:  Discontinued        1 g 100 mL/hr over 30 Minutes Intravenous Every 24 hours 12/28/12 0853 12/30/12 1414   12/28/12 0230   cefTRIAXone (ROCEPHIN) 1 g in dextrose 5 % 50 mL IVPB        1 g 100 mL/hr over 30 Minutes Intravenous  Once 12/28/12 0220 12/28/12 0415          Medications:  Scheduled:   . amLODipine  5 mg Oral Daily  . amoxicillin  500 mg Oral Q8H  . ferrous sulfate  325 mg Oral BID WC  . furosemide  40 mg Oral Daily  . insulin aspart  0-15 Units Subcutaneous TID WC  . insulin aspart  0-5 Units Subcutaneous QHS  . insulin glargine  80 Units Subcutaneous QHS  . ketorolac  30 mg Intravenous Q6H  . losartan  100 mg Oral Daily  . pantoprazole  40 mg Oral Daily  . polyethylene glycol  17 g Oral BID  . potassium chloride SA  20 mEq Oral BID  . senna-docusate  2 tablet Oral QHS  . simvastatin  20 mg Oral QPM  . sodium chloride  3 mL Intravenous Q12H    Objective: Vital signs in last 24 hours: Temp:  [97.7 F (36.5 C)-100.6 F (38.1 C)] 98.8 F (37.1 C) (01/12 0941) Pulse Rate:  [78-86] 82  (01/12 0941) Resp:  [18] 18  (01/12 0941) BP: (132-150)/(62-80) 137/76 mmHg (01/12 0941) SpO2:  [96 %-100 %] 96 % (01/12 0941)   General appearance: alert, cooperative and  no distress  Lab Results  Basename 01/05/13 0655 01/04/13 0500  WBC 8.7 8.8  HGB 7.9* 7.4*  HCT 25.8* 24.4*  NA 135 136  K 3.6 3.8  CL 99 102  CO2 25 24  BUN 9 10  CREATININE 0.65 0.72  GLU -- --   Liver Panel No results found for this basename: PROT:2,ALBUMIN:2,AST:2,ALT:2,ALKPHOS:2,BILITOT:2,BILIDIR:2,IBILI:2 in the last 72 hours Sedimentation Rate  Basename 01/03/13 1503  ESRSEDRATE 126*   C-Reactive Protein No results found for this basename: CRP:2 in the last 72 hours  Microbiology: Recent Results (from the past 240 hour(s))  URINE CULTURE     Status: Normal   Collection Time   12/28/12  1:10 AM      Component Value Range Status Comment   Specimen Description URINE, CLEAN CATCH   Final    Special Requests CX ADDED AT 0138 ON [id number]   Final    Culture  Setup Time 12/28/2012 11:16   Final    Colony Count >=100,000 COLONIES/ML   Final    Culture ENTEROCOCCUS SPECIES  Final    Report Status 12/30/2012 FINAL   Final    Organism ID, Bacteria ENTEROCOCCUS SPECIES   Final   CULTURE, BLOOD (ROUTINE X 2)     Status: Normal   Collection Time   12/28/12  1:35 AM      Component Value Range Status Comment   Specimen Description BLOOD RIGHT ARM   Final    Special Requests BOTTLES DRAWN AEROBIC AND ANAEROBIC [facility] EACH   Final    Culture  Setup Time 12/28/2012 11:19   Final    Culture NO GROWTH 5 DAYS   Final    Report Status 01/03/2013 FINAL   Final   CULTURE, BLOOD (ROUTINE X 2)     Status: Normal   Collection Time   12/28/12  1:50 AM      Component Value Range Status Comment   Specimen Description BLOOD LEFT ARM   Final    Special Requests BOTTLES DRAWN AEROBIC AND ANAEROBIC 5CC EACH   Final    Culture  Setup Time 12/29/2012 11:31   Final    Culture NO GROWTH 5 DAYS   Final    Report Status 01/04/2013 FINAL   Final   WOUND CULTURE     Status: Normal   Collection Time   01/01/13  6:22 PM      Component Value Range Status Comment   Specimen Description WOUND BACK   Final     Special Requests SPECIMEN NO 1 LEFT L5 SCREW CAVITY   Final    Gram Stain     Final    Value: NO WBC SEEN     NO SQUAMOUS EPITHELIAL CELLS SEEN     NO ORGANISMS SEEN   Culture NO GROWTH 2 DAYS   Final    Report Status 01/04/2013 FINAL   Final   GRAM STAIN     Status: Normal   Collection Time   01/01/13  6:22 PM      Component Value Range Status Comment   Specimen Description WOUND BACK   Final    Special Requests SPECIMEN NO 1 LEFT L5 SCREW CAVITY   Final    Gram Stain     Final    Value: RARE WBC PRESENT,BOTH PMN AND MONONUCLEAR     NO ORGANISMS SEEN   Report Status 01/01/2013 FINAL   Final   ANAEROBIC CULTURE     Status: Normal (Preliminary result)   Collection Time   01/01/13  6:22 PM      Component Value Range Status Comment   Specimen Description WOUND BACK   Final    Special Requests SPECIMEN NO 1 LEFT L5 SCREW CAVITY   Final    Gram Stain PENDING   Incomplete    Culture     Final    Value: NO ANAEROBES ISOLATED; CULTURE IN PROGRESS FOR 5 DAYS   Report Status PENDING   Incomplete   TISSUE CULTURE     Status: Normal (Preliminary result)   Collection Time   01/01/13  6:25 PM      Component Value Range Status Comment   Specimen Description TISSUE BONE   Final    Special Requests NO 2   Final    Gram Stain     Final    Value: FEW WBC PRESENT, PREDOMINANTLY MONONUCLEAR     NO ORGANISMS SEEN   Culture RARE PSEUDOMONAS AERUGINOSA   Final    Report Status PENDING   Incomplete   ANAEROBIC CULTURE  Status: Normal (Preliminary result)   Collection Time   01/01/13  6:25 PM      Component Value Range Status Comment   Specimen Description TISSUE BONE   Final    Special Requests NO 2   Final    Gram Stain PENDING   Incomplete    Culture     Final    Value: NO ANAEROBES ISOLATED; CULTURE IN PROGRESS FOR 5 DAYS   Report Status PENDING   Incomplete   GRAM STAIN     Status: Normal   Collection Time   01/01/13  6:25 PM      Component Value Range Status Comment   Specimen Description  TISSUE BONE   Final    Special Requests NO 2   Final    Gram Stain     Final    Value: FEW WBC PRESENT,BOTH PMN AND MONONUCLEAR     NO ORGANISMS SEEN   Report Status 01/01/2013 FINAL   Final   WOUND CULTURE     Status: Normal   Collection Time   01/01/13  6:25 PM      Component Value Range Status Comment   Specimen Description WOUND   Final    Special Requests NO 3 L5 SCREW MECHANISM   Final    Gram Stain     Final    Value: RARE WBC PRESENT,BOTH PMN AND MONONUCLEAR     NO SQUAMOUS EPITHELIAL CELLS SEEN     NO ORGANISMS SEEN   Culture NO GROWTH 2 DAYS   Final    Report Status 01/04/2013 FINAL   Final   ANAEROBIC CULTURE     Status: Normal (Preliminary result)   Collection Time   01/01/13  6:25 PM      Component Value Range Status Comment   Specimen Description WOUND   Final    Special Requests NO 3 L5 SCREW MECHANISM   Final    Gram Stain PENDING   Incomplete    Culture     Final    Value: NO ANAEROBES ISOLATED; CULTURE IN PROGRESS FOR 5 DAYS   Report Status PENDING   Incomplete   GRAM STAIN     Status: Normal   Collection Time   01/01/13  6:25 PM      Component Value Range Status Comment   Specimen Description WOUND   Final    Special Requests NO 3 SCREW MECHANISM   Final    Gram Stain     Final    Value: RARE WBC PRESENT,BOTH PMN AND MONONUCLEAR     NO ORGANISMS SEEN   Report Status 01/01/2013 FINAL   Final     Studies/Results: No results found.   Assessment/Plan: UTI  Osteomyelitis/post-op wound infection   Tissue Cx 1-8 showing pseudomonas DM Total days of antibiotics 7 (amoxil) Will start cefepime Can stop amoxil.  Place PIC, plan for 42 days of anbx Will need weekly CBC, ESR, CRP, ESR from home health care Will follow him up in ID clinic Explained to pt and wife at length, attempted to answer all questions  [name] Infectious Diseases [phone] 01/05/2013, 12:16 PM   LOS: 8 days

## 2013-01-06 LAB — CBC
HCT: 26.4 % — ABNORMAL LOW (ref 39.0–52.0)
Hemoglobin: 8 g/dL — ABNORMAL LOW (ref 13.0–17.0)
MCH: 23.3 pg — ABNORMAL LOW (ref 26.0–34.0)
MCHC: 30.3 g/dL (ref 30.0–36.0)
MCV: 76.7 fL — ABNORMAL LOW (ref 78.0–100.0)
Platelets: 389 10*3/uL (ref 150–400)
RBC: 3.44 MIL/uL — ABNORMAL LOW (ref 4.22–5.81)
RDW: 17.4 % — ABNORMAL HIGH (ref 11.5–15.5)
WBC: 6.6 10*3/uL (ref 4.0–10.5)

## 2013-01-06 LAB — ANAEROBIC CULTURE: Gram Stain: NONE SEEN

## 2013-01-06 LAB — TISSUE CULTURE

## 2013-01-06 LAB — SEDIMENTATION RATE: Sed Rate: 122 mm/hr — ABNORMAL HIGH (ref 0–16)

## 2013-01-06 LAB — GLUCOSE, CAPILLARY
Glucose-Capillary: 96 mg/dL (ref 70–99)
Glucose-Capillary: 127 mg/dL — ABNORMAL HIGH (ref 70–99)
Glucose-Capillary: 176 mg/dL — ABNORMAL HIGH (ref 70–99)
Glucose-Capillary: 209 mg/dL — ABNORMAL HIGH (ref 70–99)

## 2013-01-06 LAB — BASIC METABOLIC PANEL
BUN: 13 mg/dL (ref 6–23)
CO2: 27 mEq/L (ref 19–32)
Calcium: 9.1 mg/dL (ref 8.4–10.5)
Chloride: 100 mEq/L (ref 96–112)
Creatinine, Ser: 0.69 mg/dL (ref 0.50–1.35)
GFR calc Af Amer: >90 mL/min (ref >90)
GFR calc non Af Amer: >90 mL/min (ref >90)
Glucose, Bld: 113 mg/dL — ABNORMAL HIGH (ref 70–99)
Potassium: 4.2 mEq/L (ref 3.5–5.1)
Sodium: 134 mEq/L — ABNORMAL LOW (ref 135–145)

## 2013-01-06 LAB — C-REACTIVE PROTEIN: CRP: 12.2 mg/dL — ABNORMAL HIGH (ref <0.60)

## 2013-01-06 MED ORDER — SODIUM CHLORIDE 0.9 % IJ SOLN
10.0000 mL | INTRAMUSCULAR | Status: DC | PRN
  Administered 2013-01-07 (×2): 10 mL

## 2013-01-06 MED ORDER — DEXTROSE 5 % IV SOLN: 2.0000 g | Freq: Two times a day (BID) | INTRAVENOUS | Status: DC

## 2013-01-06 NOTE — Progress Notes (Signed)
Peripherally Inserted Central Catheter/Midline Placement  The IV Nurse has discussed with the patient and/or persons authorized to consent for the patient, the purpose of this procedure and the potential benefits and risks involved with this procedure.  The benefits include less needle sticks, lab draws from the catheter and patient may be discharged home with the catheter.  Risks include, but not limited to, infection, bleeding, blood clot (thrombus formation), and puncture of an artery; nerve damage and irregular heat beat.  Alternatives to this procedure were also discussed.  PICC/Midline Placement Documentation        Mellissa Kohut 01/06/2013, 12:57 PM

## 2013-01-06 NOTE — Progress Notes (Signed)
Pt brought home unit and is on pre-set home settings.

## 2013-01-06 NOTE — Progress Notes (Signed)
Physical Therapy Treatment Patient Details Name: Kevin [name] MRN: [id number] DOB: Jan 14, 1946 Today's Date: 01/06/2013 Time: 4782-9562 PT Time Calculation (min): 38 min  PT Assessment / Plan / Recommendation Comments on Treatment Session  Pt reports pain is better controlled today but still present & limiting factor.   Mobility improving- able to increase ambulation distance & perform stairs with more ease compared to last PT session.      Follow Up Recommendations  Home health PT     Does the patient have the potential to tolerate intense rehabilitation     Barriers to Discharge        Equipment Recommendations  None recommended by PT    Recommendations for Other Services    Frequency Min 5X/week   Plan Discharge plan remains appropriate    Precautions / Restrictions Precautions Precautions: Back;Fall Restrictions Weight Bearing Restrictions: No   Pertinent Vitals/Pain 7/10 back.      Mobility  Bed Mobility Bed Mobility: Rolling Left;Left Sidelying to Sit;Sitting - Scoot to Edge of Bed Rolling Left: 5: Supervision;With rail Left Sidelying to Sit: 5: Supervision;HOB flat;With rails Sitting - Scoot to Edge of Bed: 5: Supervision Details for Bed Mobility Assistance: Cues to reinforce back precautions Transfers Transfers: Sit to Stand;Stand to Sit Sit to Stand: With upper extremity assist;From bed;From chair/3-in-1;With armrests;4: Min assist Stand to Sit: 4: Min guard;With upper extremity assist;With armrests;To chair/3-in-1 Details for Transfer Assistance: Pt cont's to pull himself to standing with hands starting on RW rather than pushing up from seated surface despite encouragement/cues for safest technique.  (A) to stabilize RW.   Ambulation/Gait Ambulation/Gait Assistance: 4: Min guard Ambulation Distance (Feet): 80 Feet Assistive device: Rolling walker Ambulation/Gait Assistance Details: Very guarded & stiff posture.  Relies heavily on RW.  Multiple brief  standing rest breaks.   Gait Pattern: Step-through pattern;Decreased stride length Stairs: Yes Stairs Assistance: 4: Min guard Stairs Assistance Details (indicate cue type and reason): Cues for no twisting.   Stair Management Technique: One rail Left;Sideways;Forwards Number of Stairs: 4  Wheelchair Mobility Wheelchair Mobility: No      PT Goals Acute Rehab PT Goals Time For Goal Achievement: 01/09/13 Potential to Achieve Goals: Good Pt will go Supine/Side to Sit: with modified independence PT Goal: Supine/Side to Sit - Progress: Progressing toward goal Pt will go Sit to Stand: with modified independence PT Goal: Sit to Stand - Progress: Progressing toward goal Pt will go Stand to Sit: with modified independence PT Goal: Stand to Sit - Progress: Progressing toward goal Pt will Ambulate: >150 feet;with modified independence PT Goal: Ambulate - Progress: Progressing toward goal Pt will Go Up / Down Stairs: 3-5 stairs;with modified independence PT Goal: Up/Down Stairs - Progress: Progressing toward goal  Visit Information  Last PT Received On: 01/06/13 Assistance Needed: +1    Subjective Data      Cognition  Overall Cognitive Status: Appears within functional limits for tasks assessed/performed Arousal/Alertness: Awake/alert Orientation Level: Appears intact for tasks assessed Behavior During Session: [facility] for tasks performed    Balance     End of Session PT - End of Session Equipment Utilized During Treatment: Gait belt Activity Tolerance: Patient tolerated treatment well;Patient limited by pain Patient left: in chair;with call bell/phone within reach;with family/visitor present Nurse Communication: Mobility status     Verdell Face, [place] [phone] 01/06/2013

## 2013-01-06 NOTE — Progress Notes (Signed)
INFECTIOUS DISEASE PROGRESS NOTE  ID: Kevin [name] is a 67 y.o. male with  Principal Problem:  *UTI (lower urinary tract infection) Active Problems:  DIABETES MELLITUS  HYPERLIPIDEMIA  OBESITY, MORBID  HYPERTENSION  SLEEP APNEA  Encephalopathy acute  Subjective: Without complaints. No rash or diarrhea since on cefepime  Abtx:  Anti-infectives     Start     Dose/Rate Route Frequency Ordered Stop   01/06/13 0000   dextrose 5 % SOLN 50 mL with ceFEPIme 2 G SOLR 2 g        2 g 100 mL/hr over 30 Minutes Intravenous Every 12 hours 01/06/13 0721     01/05/13 1245   ceFEPIme (MAXIPIME) 2 g in dextrose 5 % 50 mL IVPB        2 g 100 mL/hr over 30 Minutes Intravenous Every 12 hours 01/05/13 1238     01/04/13 0000   amoxicillin (AMOXIL) 500 MG capsule  Status:  Discontinued        500 mg Oral Every 8 hours 01/04/13 1108 01/06/13    12/30/12 1500   amoxicillin (AMOXIL) capsule 500 mg  Status:  Discontinued        500 mg Oral 3 times per day 12/30/12 1415 01/05/13 1238   12/28/12 1100   cefTRIAXone (ROCEPHIN) 1 g in dextrose 5 % 50 mL IVPB  Status:  Discontinued        1 g 100 mL/hr over 30 Minutes Intravenous Every 24 hours 12/28/12 0853 12/30/12 1414   12/28/12 0230   cefTRIAXone (ROCEPHIN) 1 g in dextrose 5 % 50 mL IVPB        1 g 100 mL/hr over 30 Minutes Intravenous  Once 12/28/12 0220 12/28/12 0415          Medications:  Scheduled:   . amLODipine  5 mg Oral Daily  . ceFEPime (MAXIPIME) IV  2 g Intravenous Q12H  . furosemide  40 mg Oral Daily  . insulin aspart  0-15 Units Subcutaneous TID WC  . insulin aspart  0-5 Units Subcutaneous QHS  . insulin glargine  80 Units Subcutaneous QHS  . ketorolac  30 mg Intravenous Q6H  . losartan  100 mg Oral Daily  . pantoprazole  40 mg Oral Daily  . polyethylene glycol  17 g Oral BID  . potassium chloride SA  20 mEq Oral BID  . senna-docusate  2 tablet Oral QHS  . simvastatin  20 mg Oral QPM  . sodium chloride  3 mL  Intravenous Q12H    Objective: Vital signs in last 24 hours: Temp:  [97.6 F (36.4 C)-98.5 F (36.9 C)] 98.5 F (36.9 C) (01/13 1037) Pulse Rate:  [74-86] 80  (01/13 1037) Resp:  [18-20] 18  (01/13 1037) BP: (118-156)/(65-76) 137/72 mmHg (01/13 1037) SpO2:  [95 %-99 %] 98 % (01/13 1037)   General appearance: alert, cooperative and no distress  Lab Results  Basename 01/06/13 0645 01/05/13 0655  WBC 6.6 8.7  HGB 8.0* 7.9*  HCT 26.4* 25.8*  NA 134* 135  K 4.2 3.6  CL 100 99  CO2 27 25  BUN 13 9  CREATININE 0.69 0.65  GLU -- --   Liver Panel No results found for this basename: PROT:2,ALBUMIN:2,AST:2,ALT:2,ALKPHOS:2,BILITOT:2,BILIDIR:2,IBILI:2 in the last 72 hours Sedimentation Rate  Basename 01/06/13 0645  ESRSEDRATE 122*   C-Reactive Protein  Basename 01/06/13 0645  CRP 12.2*    Microbiology: Recent Results (from the past 240 hour(s))  URINE CULTURE  Status: Normal   Collection Time   12/28/12  1:10 AM      Component Value Range Status Comment   Specimen Description URINE, CLEAN CATCH   Final    Special Requests CX ADDED AT 0138 ON 161096   Final    Culture  Setup Time 12/28/2012 11:16   Final    Colony Count >=100,000 COLONIES/ML   Final    Culture ENTEROCOCCUS SPECIES   Final    Report Status 12/30/2012 FINAL   Final    Organism ID, Bacteria ENTEROCOCCUS SPECIES   Final   CULTURE, BLOOD (ROUTINE X 2)     Status: Normal   Collection Time   12/28/12  1:35 AM      Component Value Range Status Comment   Specimen Description BLOOD RIGHT ARM   Final    Special Requests BOTTLES DRAWN AEROBIC AND ANAEROBIC [facility] EACH   Final    Culture  Setup Time 12/28/2012 11:19   Final    Culture NO GROWTH 5 DAYS   Final    Report Status 01/03/2013 FINAL   Final   CULTURE, BLOOD (ROUTINE X 2)     Status: Normal   Collection Time   12/28/12  1:50 AM      Component Value Range Status Comment   Specimen Description BLOOD LEFT ARM   Final    Special Requests BOTTLES DRAWN  AEROBIC AND ANAEROBIC 5CC EACH   Final    Culture  Setup Time 12/29/2012 11:31   Final    Culture NO GROWTH 5 DAYS   Final    Report Status 01/04/2013 FINAL   Final   WOUND CULTURE     Status: Normal   Collection Time   01/01/13  6:22 PM      Component Value Range Status Comment   Specimen Description WOUND BACK   Final    Special Requests SPECIMEN NO 1 LEFT L5 SCREW CAVITY   Final    Gram Stain     Final    Value: NO WBC SEEN     NO SQUAMOUS EPITHELIAL CELLS SEEN     NO ORGANISMS SEEN   Culture NO GROWTH 2 DAYS   Final    Report Status 01/04/2013 FINAL   Final   GRAM STAIN     Status: Normal   Collection Time   01/01/13  6:22 PM      Component Value Range Status Comment   Specimen Description WOUND BACK   Final    Special Requests SPECIMEN NO 1 LEFT L5 SCREW CAVITY   Final    Gram Stain     Final    Value: RARE WBC PRESENT,BOTH PMN AND MONONUCLEAR     NO ORGANISMS SEEN   Report Status 01/01/2013 FINAL   Final   ANAEROBIC CULTURE     Status: Normal   Collection Time   01/01/13  6:22 PM      Component Value Range Status Comment   Specimen Description WOUND BACK   Final    Special Requests SPECIMEN NO 1 LEFT L5 SCREW CAVITY   Final    Gram Stain     Final    Value: NO WBC SEEN     NO SQUAMOUS EPITHELIAL CELLS SEEN     NO ORGANISMS SEEN   Culture NO ANAEROBES ISOLATED   Final    Report Status 01/06/2013 FINAL   Final   TISSUE CULTURE     Status: Normal   Collection Time  01/01/13  6:25 PM      Component Value Range Status Comment   Specimen Description TISSUE BONE   Final    Special Requests NO 2   Final    Gram Stain     Final    Value: FEW WBC PRESENT, PREDOMINANTLY MONONUCLEAR     NO ORGANISMS SEEN   Culture RARE PSEUDOMONAS AERUGINOSA   Final    Report Status 01/06/2013 FINAL   Final    Organism ID, Bacteria PSEUDOMONAS AERUGINOSA   Final   ANAEROBIC CULTURE     Status: Normal   Collection Time   01/01/13  6:25 PM      Component Value Range Status Comment   Specimen  Description TISSUE BONE   Final    Special Requests NO 2   Final    Gram Stain     Final    Value: FEW WBC PRESENT, PREDOMINANTLY MONONUCLEAR     NO ORGANISMS SEEN   Culture NO ANAEROBES ISOLATED   Final    Report Status 01/06/2013 FINAL   Final   GRAM STAIN     Status: Normal   Collection Time   01/01/13  6:25 PM      Component Value Range Status Comment   Specimen Description TISSUE BONE   Final    Special Requests NO 2   Final    Gram Stain     Final    Value: FEW WBC PRESENT,BOTH PMN AND MONONUCLEAR     NO ORGANISMS SEEN   Report Status 01/01/2013 FINAL   Final   WOUND CULTURE     Status: Normal   Collection Time   01/01/13  6:25 PM      Component Value Range Status Comment   Specimen Description WOUND   Final    Special Requests NO 3 L5 SCREW MECHANISM   Final    Gram Stain     Final    Value: RARE WBC PRESENT,BOTH PMN AND MONONUCLEAR     NO SQUAMOUS EPITHELIAL CELLS SEEN     NO ORGANISMS SEEN   Culture NO GROWTH 2 DAYS   Final    Report Status 01/04/2013 FINAL   Final   ANAEROBIC CULTURE     Status: Normal   Collection Time   01/01/13  6:25 PM      Component Value Range Status Comment   Specimen Description WOUND   Final    Special Requests NO 3 L5 SCREW MECHANISM   Final    Gram Stain     Final    Value: RARE WBC PRESENT,BOTH PMN AND MONONUCLEAR     NO SQUAMOUS EPITHELIAL CELLS SEEN     NO ORGANISMS SEEN   Culture NO ANAEROBES ISOLATED   Final    Report Status 01/06/2013 FINAL   Final   GRAM STAIN     Status: Normal   Collection Time   01/01/13  6:25 PM      Component Value Range Status Comment   Specimen Description WOUND   Final    Special Requests NO 3 SCREW MECHANISM   Final    Gram Stain     Final    Value: RARE WBC PRESENT,BOTH PMN AND MONONUCLEAR     NO ORGANISMS SEEN   Report Status 01/01/2013 FINAL   Final     Studies/Results: No results found.   Assessment/Plan:  UTI  Osteomyelitis/post-op wound infection  Tissue Cx 1-8 showing pseudomonas (pan  sensitive) DM  Total days of antibiotics  8 (amoxil--> cefepime day 1 today)   Place PIC, plan for 42 days of anbx  Will need weekly CBC, ESR, CRP, ESR from home health care  Will follow him up in ID clinic At his request, I have obtained the screw from his surgery and given it to him.   Total day [name] Infectious Diseases [phone] 01/06/2013, 2:14 PM   LOS: 9 days

## 2013-01-06 NOTE — Progress Notes (Signed)
Inpatient Diabetes Program Recommendations  AACE/ADA: New Consensus Statement on Inpatient Glycemic Control (2013)  Target Ranges:  Prepandial:   less than 140 mg/dL      Peak postprandial:   less than 180 mg/dL (1-2 hours)      Critically ill patients:  140 - 180 mg/dL   Reason for Visit: Results for LAZER, WOLLARD (MRN [id number]) as of 01/06/2013 13:34  Ref. Range 01/05/2013 16:13 01/05/2013 21:51 01/06/2013 07:11 01/06/2013 12:01  Glucose-Capillary Latest Range: 70-99 mg/dL 782 (H) 956 (H) 96 213 (H)   Note CBG yesterday morning was low.  This morning CBG was 96 mg/dL.  Consider reducing Lantus to 70 units daily and add Novolog meal coverage 3 units tid with meals to cover CHO intake.  Will follow.

## 2013-01-06 NOTE — Progress Notes (Signed)
Subjective: Back pain was worse yesterday  Objective: Vital signs in last 24 hours: Temp:  [97.6 F (36.4 C)-98.8 F (37.1 C)] 97.6 F (36.4 C) (01/13 0148) Pulse Rate:  [75-86] 75  (01/13 0148) Resp:  [18-20] 20  (01/13 0148) BP: (125-156)/(65-76) 156/76 mmHg (01/13 0148) SpO2:  [95 %-98 %] 96 % (01/13 0148) Weight change:  Last BM Date: 01/04/13  Intake/Output from previous day: 01/12 0701 - 01/13 0700 In: 3 [I.V.:3] Out: -  Intake/Output this shift:    General appearance: alert and cooperative Head: Normo  Lab Results:  Basename 01/05/13 0655 01/04/13 0500  WBC 8.7 8.8  HGB 7.9* 7.4*  HCT 25.8* 24.4*  PLT 404* 355   BMET  Basename 01/05/13 0655 01/04/13 0500  NA 135 136  K 3.6 3.8  CL 99 102  CO2 25 24  GLUCOSE 53* 46*  BUN 9 10  CREATININE 0.65 0.72  CALCIUM 9.1 9.0    Studies/Results: No results found.  Medications: I have reviewed the patient's current medications.  Assessment/Plan: 1) osteomyelitis: bone tissue culture positive for pseudomonas. Cefepime started per ID. PICC line to be placed for outpatient antobiotics 6 weeks 2) UTI: enterococcus. Amoxicillin D/Ced by ID. 3) diabetes mellitus: blood sugars reviewed. Well controled  4) Hypertension: good control. No changes  5) pain management:  Receiving percocet 2 tabs every 6 hours and Toradol 30 mg. Will increase Toradol dose  6) sleep apnea: continue with CPAP  7) anemia: progressive in hospital. Now on iron. Continue to follow as HBG is fairly low Discharge today or tomorrow depending on pain level    LOS: 9 days   Kevin [name] JOSEPH 01/06/2013, 7:10 AM

## 2013-01-07 LAB — GLUCOSE, CAPILLARY
Glucose-Capillary: 97 mg/dL (ref 70–99)
Glucose-Capillary: 94 mg/dL (ref 70–99)

## 2013-01-07 LAB — BASIC METABOLIC PANEL
BUN: 12 mg/dL (ref 6–23)
CO2: 25 mEq/L (ref 19–32)
Calcium: 9 mg/dL (ref 8.4–10.5)
Chloride: 99 mEq/L (ref 96–112)
Creatinine, Ser: 0.65 mg/dL (ref 0.50–1.35)
GFR calc Af Amer: >90 mL/min (ref >90)
GFR calc non Af Amer: >90 mL/min (ref >90)
Glucose, Bld: 108 mg/dL — ABNORMAL HIGH (ref 70–99)
Potassium: 4.1 mEq/L (ref 3.5–5.1)
Sodium: 135 mEq/L (ref 135–145)

## 2013-01-07 LAB — CBC
HCT: 25.1 % — ABNORMAL LOW (ref 39.0–52.0)
Hemoglobin: 7.7 g/dL — ABNORMAL LOW (ref 13.0–17.0)
MCH: 23.5 pg — ABNORMAL LOW (ref 26.0–34.0)
MCHC: 30.7 g/dL (ref 30.0–36.0)
MCV: 76.5 fL — ABNORMAL LOW (ref 78.0–100.0)
Platelets: 429 10*3/uL — ABNORMAL HIGH (ref 150–400)
RBC: 3.28 MIL/uL — ABNORMAL LOW (ref 4.22–5.81)
RDW: 17.4 % — ABNORMAL HIGH (ref 11.5–15.5)
WBC: 7.6 10*3/uL (ref 4.0–10.5)

## 2013-01-07 MED ORDER — HEPARIN SOD (PORK) LOCK FLUSH 100 UNIT/ML IV SOLN
250.0000 [IU] | INTRAVENOUS | Status: AC | PRN
  Administered 2013-01-07: 250 [IU]

## 2013-01-07 NOTE — Discharge Summary (Signed)
Physician Discharge Summary  Patient ID: Kevin [name] MRN: [id number] DOB/AGE: 1946/05/13 67 y.o.  Admit date: 12/28/2012 Discharge date: 01/07/2013  Admission Diagnoses: UTI Fever Back pain Diabetes mellitus Hypertension Hyperlipidemia Morbid obesity Sleep apnea  Discharge Diagnoses:  Principal Problem: Vertebral osteomyelitis Active Problems: UTI Back pain  DIABETES MELLITUS  HYPERLIPIDEMIA  OBESITY, MORBID  HYPERTENSION  SLEEP APNEA    Discharged Condition: good  Hospital Course: The patient was admitted on January 4 complaining of back and groin pain. He become acutely confused at home and was brought to the ER and found to have a temperature 102.9 and evidence of urinary tract infection on urinalysis. He had been complaining of back pain acutely for a few days and more chronically since his back surgery in September of 2013. At admission laboratory showed normal BUN creatinine, sodium 132, albumin 2.4, WT 0.1, hemoglobin 10.8, 21-50 WBCs 11-20 RBCs. A CT scan of the abdomen and pelvis showed nonobstructing renal calculi bilaterally, ventral hernias, diverticulosis, interval L3-5 fusion with abnormal soft tissue prevertebral area, infection not excluded. Indistinct in place at L4-5 as well as a lucency surrounding left pedicle screw at L5. The patient was seen by his neurosurgeon Dr. Franky Macho. He felt he likely had osteomyelitis at the L5 level and at the L5 screw should be removed. Sedimentation rate was 130 and C-reactive protein was 13.1.the patient underwent surgery on January 8 with removal of L5 pedicle screw, there is no definite evidence of wound infection or purulence, cultures were taken. The patient was treated with Rocephin and then switched to amoxicillin for UTI when he grew enterococcus. One tissue culture from the surgical site eventually grew out Pseudomonas. The patient had been seen by Dr. Ninetta Lights of the ID service who recommended starting cefepime 2 g IV every  12 and stopping amoxicillin. PICC line was placed. The patient will require 6 weeks of antibiotics with a CBC, sedimentation rate and CRP weekly. The patient did experience considerable back pain during hospitalization. He was seen multiple times by physical therapy and at discharge he was ambulating with assistance and able to negotiate steps and feeling better. He was placed in about regimen and was having normal bowel movements. His blood sugars remained under pretty good control throughout the hospitalization. He did develop significant anemia likely related to chronic disease with hemoglobin at discharge 7.7, he did have some iron deficiency and lab work was placed on iron. Pain controlled with Percocet and muscle relaxers as needed. At discharge home nursing for IV antibiotics and home physical therapy was arranged.  CODE STATUS full code Activity as tolerated, home physical therapy to guide Diet carbohydrate modified diet  Consults: ID and Neurosurgery  Significant Diagnostic Studies: labs: As above, at discharge CBC WBC 7.8 hemoglobin 9.2 platelet count 314, sodium 134, BUN 10 creatinine 0.65, sedimentation rate 122, CRP 12.2 microbiology: urine culture: positive for Enterococcus and wound culture: positive for Pseudomonas aeruginosa and radiology: CT scan: As per above  Treatments: IV hydration, antibiotics: Amoxicillin and cefepime, analgesia: Percocet and therapies: PT  Discharge Exam: Blood pressure 137/77, pulse 80, temperature 99.3 F (37.4 C), temperature source Oral, resp. rate 18, height 6\' 2"  (1.88 m), weight 113.399 kg (250 lb), SpO2 96.00%. General appearance: alert and cooperative  Disposition: 01-Home or Self Care  Discharge Orders    Future Appointments: Provider: Department: Dept Phone: Center:   01/23/2013 2:00 PM Randall Hiss, MD [facility] [phone] [facility]  Medication List     As of 01/07/2013  7:21 AM    TAKE  these medications         ACAI PO   Take 1 capsule by mouth daily.      amLODipine 10 MG tablet   Commonly known as: NORVASC   Take 5 mg by mouth daily.      aspirin 325 MG tablet   Take 325 mg by mouth daily.      cyclobenzaprine 10 MG tablet   Commonly known as: FLEXERIL   Take 10 mg by mouth 3 (three) times daily as needed. For pain      dextrose 5 % SOLN 50 mL with ceFEPIme 2 G SOLR 2 g   Inject 2 g into the vein every 12 (twelve) hours.      ferrous sulfate 325 (65 FE) MG tablet   Take 1 tablet (325 mg total) by mouth daily with breakfast.      furosemide 40 MG tablet   Commonly known as: LASIX   Take 40 mg by mouth daily.      insulin aspart 100 UNIT/ML injection   Commonly known as: novoLOG   Inject 40 Units into the skin 3 (three) times daily before meals.      insulin glargine 100 UNIT/ML injection   Commonly known as: LANTUS   Inject 80 Units into the skin at bedtime.      losartan 100 MG tablet   Commonly known as: COZAAR   Take 100 mg by mouth daily.      metFORMIN 1000 MG tablet   Commonly known as: GLUCOPHAGE   Take 1,000-1,500 mg by mouth 2 (two) times daily with a meal. 1000mg  in the morning; 1500mg  in the evening      multivitamin with minerals Tabs   Take 1 tablet by mouth daily.      omeprazole 40 MG capsule   Commonly known as: PRILOSEC   Take 40 mg by mouth 2 (two) times daily.      oxyCODONE-acetaminophen 10-325 MG per tablet   Commonly known as: PERCOCET   Take 1 tablet by mouth every 4 (four) hours as needed. For pain      polyethylene glycol packet   Commonly known as: MIRALAX / GLYCOLAX   Take 17 g by mouth daily as needed.      potassium chloride SA 20 MEQ tablet   Commonly known as: K-DUR,KLOR-CON   Take 20 mEq by mouth daily.      Pycnogenol 154-25 MG Caps   Take 1 capsule by mouth daily.      RESVERATROL 100 MG Caps   Take 1 capsule by mouth daily.      senna-docusate 8.6-50 MG per tablet   Commonly known as: Senokot-S     Take 2 tablets by mouth at bedtime.      simvastatin 20 MG tablet   Commonly known as: ZOCOR   Take 20 mg by mouth every evening.      tadalafil 20 MG tablet   Commonly known as: CIALIS   Take 20 mg by mouth daily as needed.      valsartan 160 MG tablet   Commonly known as: DIOVAN   Take 160 mg by mouth daily.           Follow-up Information    Follow up with Lillia Mountain, MD. In 14 days.   Contact information:   [address], [address] [facility]  [place] [address] [phone]       Follow up with [name], MD. In 14 weeks.   Contact information:   [address]  [address] [place] [address] [phone]          Signed: Lillia Mountain 01/07/2013, 7:21 AM

## 2013-01-23 ENCOUNTER — Encounter: Payer: Self-pay | Admitting: Infectious Disease

## 2013-01-23 ENCOUNTER — Ambulatory Visit (INDEPENDENT_AMBULATORY_CARE_PROVIDER_SITE_OTHER): Payer: Medicare Other | Admitting: Infectious Disease

## 2013-01-23 ENCOUNTER — Ambulatory Visit (HOSPITAL_COMMUNITY)
Admission: RE | Admit: 2013-01-23 | Discharge: 2013-01-23 | Disposition: A | Discharge status: Home / Self Care | Payer: Medicare Other | Source: Ambulatory Visit | Attending: Infectious Disease | Admitting: Infectious Disease

## 2013-01-23 VITALS — BP 127/79 | HR 85 | Temp 97.5°F | Ht 73.0 in | Wt 236.5 lb

## 2013-01-23 DIAGNOSIS — T847XXA Infection and inflammatory reaction due to other internal orthopedic prosthetic devices, implants and grafts, initial encounter: Secondary | ICD-10-CM

## 2013-01-23 DIAGNOSIS — M869 Osteomyelitis, unspecified: Secondary | ICD-10-CM

## 2013-01-23 DIAGNOSIS — A498 Other bacterial infections of unspecified site: Secondary | ICD-10-CM

## 2013-01-23 DIAGNOSIS — M7989 Other specified soft tissue disorders: Secondary | ICD-10-CM

## 2013-01-23 DIAGNOSIS — M79609 Pain in unspecified limb: Secondary | ICD-10-CM

## 2013-01-23 DIAGNOSIS — M519 Unspecified thoracic, thoracolumbar and lumbosacral intervertebral disc disorder: Secondary | ICD-10-CM

## 2013-01-23 DIAGNOSIS — B965 Pseudomonas (aeruginosa) (mallei) (pseudomallei) as the cause of diseases classified elsewhere: Secondary | ICD-10-CM

## 2013-01-23 DIAGNOSIS — M4640 Discitis, unspecified, site unspecified: Secondary | ICD-10-CM | POA: Insufficient documentation

## 2013-01-23 DIAGNOSIS — M4620 Osteomyelitis of vertebra, site unspecified: Secondary | ICD-10-CM

## 2013-01-23 MED ORDER — CEFEPIME HCL 2 G IJ SOLR: INTRAMUSCULAR | Status: DC

## 2013-01-23 NOTE — Progress Notes (Signed)
*  PRELIMINARY RESULTS* Vascular Ultrasound Right upper extremity venous duplex has been completed.  Preliminary findings:   No evidence of DVT or superficial thrombosis.    Called report to Dr. Daiva Eves.   Farrel Demark, RDMS, RVT 01/23/2013, 4:08 PM

## 2013-01-23 NOTE — Progress Notes (Signed)
Subjective:    Patient ID: Kevin [name], male    DOB: 1946/06/04, 67 y.o.   MRN: [id number]  HPI  67 yo Mw with hx DM2 (since 1997) and spinal stenosis. On 08-30-12 under going decompression, screws, cage and autograft L3-5. He did well post-op and was able to be d/c home on 09-04-12. He returnedon 12-28-12 with back pain and confusion. In ED he was found to have lower abd/supra-pubic pain, temp 102.9. He was found to have an UCx growing Enterococcus (sens Amp, Vanco). He was started on ceftriaxone. He was also found to have a soft tissue mass on his lower lumbar area. He underwent CT scan of the abdomen on 12/28/2012 showing abnormal soft tissue prevertebral area as well as indistinct endplates at L4-5 with lucency around the pedicle screw at L5. He underwent surgery by Dr. Franky Macho with POSTERIOR LUMBAR FUSION 1 WITH HARDWARE REMOVAL L5 pedicle screw. Bone cultures grew P  PSEUDOMONAS AERUGINOSA       Antibiotic  Sensitivity  Microscan  Status    CEFEPIME  Sensitive  <=1  Final    Method:  MIC    CEFTAZIDIME  Sensitive  <=1  Final    Method:  MIC    CIPROFLOXACIN  Sensitive  <=0.25  Final    Method:  MIC    GENTAMICIN  Sensitive  <=1  Final    Method:  MIC    IMIPENEM  Sensitive  2  Final    Method:  MIC    PIP/TAZO  Sensitive  <=4  Final    Method:  MIC    TOBRAMYCIN  Sensitive  <=1  Final    He was changed to Cefepime 2 grams IV q 12 hours since then and is being seen for followup.  Today he reports dramatic improvement in his LBP now at 6/10 more so with exertion. He states ESR has gone down to 60s as measured by [facility].  There were several concerns addressed today  #1 the IV cefepime was cloudy appearing when arrive at their house today and unsure of safety or if was frozen? We are asking [facility] to look intot this  #2 I wished to push his dose to more aggressive regimen of 2g IV q 8 hours and while pt and wife were accepting of this they were upset that. I tried to explain that there  were different ways to prescribe these types of abx including the q 12hours he was receiving but it was my preferenc to go with MOST aggressve dosing interval of 2 g iv q 8 hours and we will change to this.  #3 he is developing pain due to PICC dressing fitting too tightly and there was also concern about sublte swelling here. We will get Dopppler to exclude DVT here. I spent greater than 45 minutes with the patient including greater than 50% of time in face to face counsel of the patient and in coordination of their care.   Review of Systems  Constitutional: Negative for fever, chills, diaphoresis, activity change, appetite change, fatigue and unexpected weight change.  HENT: Negative for congestion, sore throat, rhinorrhea, sneezing, trouble swallowing and sinus pressure.   Eyes: Negative for photophobia and visual disturbance.  Respiratory: Negative for cough, chest tightness, shortness of breath, wheezing and stridor.   Cardiovascular: Negative for chest pain, palpitations and leg swelling.  Gastrointestinal: Negative for nausea, vomiting, abdominal pain, diarrhea, constipation, blood in stool, abdominal distention and anal bleeding.  Genitourinary: Negative for dysuria, hematuria,  flank pain and difficulty urinating.  Musculoskeletal: Positive for back pain. Negative for myalgias, joint swelling, arthralgias and gait problem.  Skin: Positive for wound. Negative for color change, pallor and rash.  Neurological: Negative for dizziness, tremors, weakness and light-headedness.  Hematological: Negative for adenopathy. Does not bruise/bleed easily.  Psychiatric/Behavioral: Negative for behavioral problems, confusion, sleep disturbance, dysphoric mood, decreased concentration and agitation.       Objective:   Physical Exam  Constitutional: He is oriented to person, place, and time. He appears well-developed and well-nourished. No distress.  HENT:  Head: Normocephalic and atraumatic.  Eyes:  Conjunctivae normal and EOM are normal.  Neck: Normal range of motion. Neck supple. No JVD present.  Cardiovascular: Normal rate and regular rhythm.   Pulmonary/Chest: Effort normal. No respiratory distress.  Abdominal: He exhibits no distension.  Musculoskeletal: He exhibits no edema and no tenderness.       Arms: Neurological: He is alert and oriented to person, place, and time. He exhibits normal muscle tone. Coordination normal.  Skin: Skin is warm and dry. He is not diaphoretic. No erythema. No pallor.  Psychiatric: He has a normal mood and affect. His behavior is normal. Judgment and thought content normal.          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 Given hardware would consider prolonging his abx with po cipro at the end of his IV abx for several months  Arm swellijng: sublte: check Doppler to exclude DVT

## 2013-02-11 ENCOUNTER — Telehealth: Payer: Self-pay | Admitting: *Deleted

## 2013-02-11 NOTE — Telephone Encounter (Signed)
[facility] called for stop date on IV antibiotics, started 01/07/13.  Next appointment 02/20/13, please advise. Wendall Mola CMA

## 2013-02-11 NOTE — Telephone Encounter (Signed)
Per Dr. Daiva Eves, [facility] nurse [facility] notified to extend IV antibiotics until appointment on 02/20/13. Wendall Mola

## 2013-02-18 ENCOUNTER — Encounter: Payer: Self-pay | Admitting: Infectious Diseases

## 2013-02-20 ENCOUNTER — Ambulatory Visit (INDEPENDENT_AMBULATORY_CARE_PROVIDER_SITE_OTHER): Payer: Medicare Other | Admitting: Infectious Disease

## 2013-02-20 ENCOUNTER — Other Ambulatory Visit: Payer: Self-pay | Admitting: Infectious Disease

## 2013-02-20 ENCOUNTER — Encounter: Payer: Self-pay | Admitting: Infectious Disease

## 2013-02-20 VITALS — BP 113/71 | Temp 98.0°F | Ht 73.0 in | Wt 241.5 lb

## 2013-02-20 DIAGNOSIS — J151 Pneumonia due to Pseudomonas: Secondary | ICD-10-CM

## 2013-02-20 DIAGNOSIS — M4620 Osteomyelitis of vertebra, site unspecified: Secondary | ICD-10-CM

## 2013-02-20 DIAGNOSIS — B965 Pseudomonas (aeruginosa) (mallei) (pseudomallei) as the cause of diseases classified elsewhere: Secondary | ICD-10-CM

## 2013-02-20 DIAGNOSIS — M519 Unspecified thoracic, thoracolumbar and lumbosacral intervertebral disc disorder: Secondary | ICD-10-CM

## 2013-02-20 DIAGNOSIS — M869 Osteomyelitis, unspecified: Secondary | ICD-10-CM

## 2013-02-20 DIAGNOSIS — M4640 Discitis, unspecified, site unspecified: Secondary | ICD-10-CM

## 2013-02-20 DIAGNOSIS — A498 Other bacterial infections of unspecified site: Secondary | ICD-10-CM

## 2013-02-20 MED ORDER — CIPROFLOXACIN HCL 500 MG PO TABS: 500.0000 mg | ORAL_TABLET | Freq: Two times a day (BID) | ORAL | Status: DC

## 2013-02-20 NOTE — Progress Notes (Signed)
RN received verbal order to discontinue the patient's PICC line.  Patient identified with name and date of birth. PICC dressing removed, site unremarkable.  PICC line removed using sterile procedure @ 1440. PICC length equal to that noted in patient's hospital chart of 48 cm. Sterile petroleum gauze + sterile 4X4 applied to PICC site, pressure applied for 10 minutes and covered with Medipore tape as a pressure dressing. Patient tolerated procedure without complaints.  Patient instructed to limit use of arm for 1 hour. Patient instructed that the pressure dressing should remain in place for 24 hours. Patient verbalized understanding of these instructions.

## 2013-02-20 NOTE — Progress Notes (Signed)
Subjective:    Patient ID: Kevin [name], male    DOB: 03-23-1946, 66 y.o.   MRN: [id number]  HPI  67 yo Mw with hx DM2 (since 1997) and spinal stenosis. On 08-30-12 under going decompression, screws, cage and autograft L3-5. He did well post-op and was able to be d/c home on 09-04-12. He returnedon 12-28-12 with back pain and confusion. In ED he was found to have lower abd/supra-pubic pain, temp 102.9. He was found to have an UCx growing Enterococcus (sens Amp, Vanco). He was started on ceftriaxone. He was also found to have a soft tissue mass on his lower lumbar area. He underwent CT scan of the abdomen on 12/28/2012 showing abnormal soft tissue prevertebral area as well as indistinct endplates at L4-5 with lucency around the pedicle screw at L5. He underwent surgery by Dr. Franky Macho with POSTERIOR LUMBAR FUSION 1 WITH HARDWARE REMOVAL L5 pedicle screw. Bone cultures grew P  PSEUDOMONAS AERUGINOSA       Antibiotic  Sensitivity  Microscan  Status     CEFEPIME  Sensitive  <=1  Final     Method:  MIC     CEFTAZIDIME  Sensitive  <=1  Final     Method:  MIC     CIPROFLOXACIN  Sensitive  <=0.25  Final     Method:  MIC     GENTAMICIN  Sensitive  <=1  Final     Method:  MIC     IMIPENEM  Sensitive  2  Final     Method:  MIC     PIP/TAZO  Sensitive  <=4  Final     Method:  MIC     TOBRAMYCIN  Sensitive  <=1  Final    He was changed to Cefepime 2 grams IV q 12 hours which I then changed to 2 grams IV q 8 hours.  He had reported dramatic improvement in his LBP now at 6-8/10 more so with exertion. He states ESR has gone down to 65s as measured by [facility].   We continued onwards with higher dose of  Cefepime and he has now finished greater than 6 weeks of postooperative IV abx.  REpeat ESR is still 67 but his pain continues to improve. He isnow with 5/10 pain at rest -->8/10 with exertion. He is without fevers chills, nausea or malaise.  We  spent greater than 45 minutes with the patient including  greater than 50% of time in face to face counsel of the patient and his wife and in coordination of their care.  I reviewed operative reports, all culture data, including where Pseudomanas was isolated. I reviewed nature of Ps. Aeruginosa and pharmacology of cefepime and ciprofloxacin (which I intend to change him to today). Advised re risk of CDI on FQ therapy.      Review of Systems  Constitutional: Negative for fever, chills, diaphoresis, activity change, appetite change, fatigue and unexpected weight change.  HENT: Negative for congestion, sore throat, rhinorrhea, sneezing, trouble swallowing and sinus pressure.   Eyes: Negative for photophobia and visual disturbance.  Respiratory: Negative for cough, chest tightness, shortness of breath, wheezing and stridor.   Cardiovascular: Negative for chest pain, palpitations and leg swelling.  Gastrointestinal: Negative for nausea, vomiting, abdominal pain, diarrhea, constipation, blood in stool, abdominal distention and anal bleeding.  Genitourinary: Negative for dysuria, hematuria, flank pain and difficulty urinating.  Musculoskeletal: Positive for back pain. Negative for myalgias, joint swelling, arthralgias and gait problem.  Skin: Negative for color change,  pallor, rash and wound.  Neurological: Negative for dizziness, tremors, weakness and light-headedness.  Hematological: Negative for adenopathy. Does not bruise/bleed easily.  Psychiatric/Behavioral: Negative for behavioral problems, confusion, sleep disturbance, dysphoric mood, decreased concentration and agitation.       Objective:   Physical Exam  Constitutional: He is oriented to person, place, and time. He appears well-developed and well-nourished. No distress.  HENT:  Head: Normocephalic and atraumatic.  Mouth/Throat: Oropharynx is clear and moist. No oropharyngeal exudate.  Eyes: Conjunctivae and EOM are normal. Pupils are equal, round, and reactive to light. No scleral icterus.   Neck: Normal range of motion. Neck supple. No JVD present.  Cardiovascular: Normal rate, regular rhythm and normal heart sounds.  Exam reveals no gallop and no friction rub.   No murmur heard. Pulmonary/Chest: Effort normal and breath sounds normal. No respiratory distress. He has no wheezes. He has no rales. He exhibits no tenderness.  Abdominal: He exhibits no distension and no mass. There is no tenderness. There is no rebound and no guarding.  Musculoskeletal: He exhibits no edema and no tenderness.  Lymphadenopathy:    He has no cervical adenopathy.  Neurological: He is alert and oriented to person, place, and time. He exhibits normal muscle tone.  Skin: Skin is warm and dry. He is not diaphoretic. No erythema. No pallor.     Psychiatric: He has a normal mood and affect. His behavior is normal. Judgment and thought content normal.          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

## 2013-02-27 ENCOUNTER — Encounter: Payer: Self-pay | Admitting: Infectious Diseases

## 2013-03-21 ENCOUNTER — Encounter: Payer: Self-pay | Admitting: Infectious Diseases

## 2013-04-24 ENCOUNTER — Ambulatory Visit (INDEPENDENT_AMBULATORY_CARE_PROVIDER_SITE_OTHER): Payer: Medicare Other | Admitting: Infectious Disease

## 2013-04-24 ENCOUNTER — Encounter: Payer: Self-pay | Admitting: Infectious Disease

## 2013-04-24 VITALS — BP 136/81 | HR 86 | Temp 98.7°F | Wt 250.0 lb

## 2013-04-24 DIAGNOSIS — B965 Pseudomonas (aeruginosa) (mallei) (pseudomallei) as the cause of diseases classified elsewhere: Secondary | ICD-10-CM

## 2013-04-24 DIAGNOSIS — Z5189 Encounter for other specified aftercare: Secondary | ICD-10-CM

## 2013-04-24 DIAGNOSIS — M4620 Osteomyelitis of vertebra, site unspecified: Secondary | ICD-10-CM | POA: Insufficient documentation

## 2013-04-24 DIAGNOSIS — A498 Other bacterial infections of unspecified site: Secondary | ICD-10-CM

## 2013-04-24 DIAGNOSIS — M869 Osteomyelitis, unspecified: Secondary | ICD-10-CM

## 2013-04-24 DIAGNOSIS — T847XXD Infection and inflammatory reaction due to other internal orthopedic prosthetic devices, implants and grafts, subsequent encounter: Secondary | ICD-10-CM

## 2013-04-24 NOTE — Progress Notes (Signed)
Subjective:    Patient ID: Kevin [name], male    DOB: 25-Jul-1946, 67 y.o.   MRN: [id number]  HPI   67 yo Mw with hx DM2 (since 1997) and spinal stenosis. On 08-30-12 under going decompression, screws, cage and autograft L3-5. He did well post-op and was able to be d/c home on 09-04-12. He returnedon 12-28-12 with back pain and confusion. In ED he was found to have lower abd/supra-pubic pain, temp 102.9. He was found to have an UCx growing Enterococcus (sens Amp, Vanco). He was started on ceftriaxone. He was also found to have a soft tissue mass on his lower lumbar area. He underwent CT scan of the abdomen on 12/28/2012 showing abnormal soft tissue prevertebral area as well as indistinct endplates at L4-5 with lucency around the pedicle screw at L5. He underwent surgery by Dr. Franky Macho with POSTERIOR LUMBAR FUSION 1 WITH HARDWARE REMOVAL L5 pedicle screw. Bone cultures grew   PSEUDOMONAS AERUGINOSA       Antibiotic  Sensitivity  Microscan  Status     CEFEPIME  Sensitive  <=1  Final     Method:  MIC     CEFTAZIDIME  Sensitive  <=1  Final     Method:  MIC     CIPROFLOXACIN  Sensitive  <=0.25  Final     Method:  MIC     GENTAMICIN  Sensitive  <=1  Final     Method:  MIC     IMIPENEM  Sensitive  2  Final     Method:  MIC     PIP/TAZO  Sensitive  <=4  Final     Method:  MIC     TOBRAMYCIN  Sensitive  <=1  Final     He was changed to Cefepime 2 grams IV q 12 hours which I then changed to 2 grams IV q 8 hours.  He had reported dramatic improvement in his LBP now at 6-8/10 more so with exertion. He states ESR has gone down to 65s as measured by [facility].   We continued onwards with higher dose of  Cefepime and he has now finished greater than 6 weeks of postooperative IV abx.  REpeat ESR is still 67 but his pain continued  to improve. He was a few months ago with with 5/10 pain at rest -->8/10 with exertion.   I saw him in clinic and changed to cipro bid.  Since then he has nearly NO pain at rest  and only 5/10 pain with exertion.  He is without fevers chills, nausea or malaise.  We  spent greater than 45 minutes with the patient including greater than 50% of time in face to face counsel of the patient and his wife and in coordination of their care.   Review of Systems  Constitutional: Negative for fever, chills, diaphoresis, activity change, appetite change, fatigue and unexpected weight change.  HENT: Negative for congestion, sore throat, rhinorrhea, sneezing, trouble swallowing and sinus pressure.   Eyes: Negative for photophobia and visual disturbance.  Respiratory: Negative for cough, chest tightness, shortness of breath, wheezing and stridor.   Cardiovascular: Negative for chest pain, palpitations and leg swelling.  Gastrointestinal: Negative for nausea, vomiting, abdominal pain, diarrhea, constipation, blood in stool, abdominal distention and anal bleeding.  Genitourinary: Negative for dysuria, hematuria, flank pain and difficulty urinating.  Musculoskeletal: Positive for back pain. Negative for myalgias, joint swelling, arthralgias and gait problem.  Skin: Negative for color change, pallor, rash and wound.  Neurological: Negative  for dizziness, tremors, weakness and light-headedness.  Hematological: Negative for adenopathy. Does not bruise/bleed easily.  Psychiatric/Behavioral: Negative for behavioral problems, confusion, sleep disturbance, dysphoric mood, decreased concentration and agitation.       Objective:   Physical Exam  Constitutional: He is oriented to person, place, and time. He appears well-developed and well-nourished. No distress.  HENT:  Head: Normocephalic and atraumatic.  Mouth/Throat: Oropharynx is clear and moist. No oropharyngeal exudate.  Eyes: Conjunctivae and EOM are normal. Pupils are equal, round, and reactive to light. No scleral icterus.  Neck: Normal range of motion. Neck supple. No JVD present.  Cardiovascular: Normal rate, regular rhythm and  normal heart sounds.  Exam reveals no gallop and no friction rub.   No murmur heard. Pulmonary/Chest: Effort normal and breath sounds normal. No respiratory distress. He has no wheezes. He has no rales. He exhibits no tenderness.  Abdominal: He exhibits no distension and no mass. There is no tenderness. There is no rebound and no guarding.  Musculoskeletal: He exhibits no edema and no tenderness.  Lymphadenopathy:    He has no cervical adenopathy.  Neurological: He is alert and oriented to person, place, and time. He exhibits normal muscle tone.  Skin: Skin is warm and dry. He is not diaphoretic. No erythema. No pallor.     Psychiatric: He has a normal mood and affect. His behavior is normal. Judgment and thought content normal.          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

## 2013-04-25 LAB — C-REACTIVE PROTEIN: CRP: <0.5 mg/dL (ref <0.60)

## 2013-04-25 LAB — SEDIMENTATION RATE: Sed Rate: 27 mm/hr — ABNORMAL HIGH (ref 0–16)

## 2013-06-26 ENCOUNTER — Ambulatory Visit: Payer: Medicare Other | Admitting: Infectious Disease

## 2013-07-14 ENCOUNTER — Telehealth: Payer: Self-pay | Admitting: Infectious Disease

## 2013-07-14 DIAGNOSIS — M4640 Discitis, unspecified, site unspecified: Secondary | ICD-10-CM

## 2013-07-14 DIAGNOSIS — J151 Pneumonia due to Pseudomonas: Secondary | ICD-10-CM

## 2013-07-14 NOTE — Telephone Encounter (Signed)
Pt had called Kevin [name] last week and was upset about rescheduling of his appt x 3  I spoke with him via phone from [place] today.  His back pain is dramatically improved now at 3-4/10 and no fevers malaise. EsR was down to 27 in May andCRP normal at <.5  I told him that I would still favor continued oral cipro given that he grew pseudomonas from pedicle screw site and he still has several screws in place all of which could be infected. I would favor pushing to a year at least.  I told him he could come in for blood work tomorrow and then have an appt later in the week either with myself as an overbook or with another ID clinic MD later in the week.

## 2013-07-15 ENCOUNTER — Other Ambulatory Visit: Payer: Self-pay | Admitting: Infectious Disease

## 2013-07-15 ENCOUNTER — Other Ambulatory Visit: Payer: Medicare Other

## 2013-07-15 DIAGNOSIS — M4620 Osteomyelitis of vertebra, site unspecified: Secondary | ICD-10-CM

## 2013-07-15 LAB — BASIC METABOLIC PANEL
BUN: 12 mg/dL (ref 6–23)
CO2: 25 mEq/L (ref 19–32)
Calcium: 9.1 mg/dL (ref 8.4–10.5)
Chloride: 99 mEq/L (ref 96–112)
Creat: 0.93 mg/dL (ref 0.50–1.35)
Glucose, Bld: 203 mg/dL — ABNORMAL HIGH (ref 70–99)
Potassium: 4.1 mEq/L (ref 3.5–5.3)
Sodium: 135 mEq/L (ref 135–145)

## 2013-07-15 LAB — CBC WITH DIFFERENTIAL/PLATELET
Basophils Absolute: 0.1 10*3/uL (ref 0.0–0.1)
Basophils Relative: 1 % (ref 0–1)
Eosinophils Absolute: 0.4 10*3/uL (ref 0.0–0.7)
Eosinophils Relative: 7 % — ABNORMAL HIGH (ref 0–5)
HCT: 39.4 % (ref 39.0–52.0)
Hemoglobin: 12.8 g/dL — ABNORMAL LOW (ref 13.0–17.0)
Lymphocytes Relative: 34 % (ref 12–46)
Lymphs Abs: 1.8 10*3/uL (ref 0.7–4.0)
MCH: 26 pg (ref 26.0–34.0)
MCHC: 32.5 g/dL (ref 30.0–36.0)
MCV: 79.9 fL (ref 78.0–100.0)
Monocytes Absolute: 0.4 10*3/uL (ref 0.1–1.0)
Monocytes Relative: 7 % (ref 3–12)
Neutro Abs: 2.7 10*3/uL (ref 1.7–7.7)
Neutrophils Relative %: 51 % (ref 43–77)
Platelets: 234 10*3/uL (ref 150–400)
RBC: 4.93 MIL/uL (ref 4.22–5.81)
RDW: 16.3 % — ABNORMAL HIGH (ref 11.5–15.5)
WBC: 5.2 10*3/uL (ref 4.0–10.5)

## 2013-07-15 LAB — C-REACTIVE PROTEIN: CRP: 0.5 mg/dL (ref <0.60)

## 2013-07-16 LAB — SEDIMENTATION RATE: Sed Rate: 11 mm/hr (ref 0–16)

## 2013-07-17 ENCOUNTER — Ambulatory Visit (INDEPENDENT_AMBULATORY_CARE_PROVIDER_SITE_OTHER): Payer: Medicare Other | Admitting: Infectious Disease

## 2013-07-17 ENCOUNTER — Ambulatory Visit: Payer: Medicare Other | Admitting: Infectious Disease

## 2013-07-17 ENCOUNTER — Encounter: Payer: Self-pay | Admitting: Infectious Disease

## 2013-07-17 VITALS — BP 154/92 | HR 84 | Temp 98.1°F | Wt 257.0 lb

## 2013-07-17 DIAGNOSIS — M519 Unspecified thoracic, thoracolumbar and lumbosacral intervertebral disc disorder: Secondary | ICD-10-CM

## 2013-07-17 DIAGNOSIS — M4640 Discitis, unspecified, site unspecified: Secondary | ICD-10-CM

## 2013-07-17 DIAGNOSIS — K5900 Constipation, unspecified: Secondary | ICD-10-CM

## 2013-07-17 DIAGNOSIS — B965 Pseudomonas (aeruginosa) (mallei) (pseudomallei) as the cause of diseases classified elsewhere: Secondary | ICD-10-CM

## 2013-07-17 DIAGNOSIS — D509 Iron deficiency anemia, unspecified: Secondary | ICD-10-CM

## 2013-07-17 DIAGNOSIS — A498 Other bacterial infections of unspecified site: Secondary | ICD-10-CM

## 2013-07-17 NOTE — Progress Notes (Signed)
Subjective:    Patient ID: Kevin [name], male    DOB: 1946/02/13, 67 y.o.   MRN: [id number]  HPI  67 yo Mw with hx DM2 (since 1997) and spinal stenosis. On 08-30-12 under going decompression, screws, cage and autograft L3-5. He did well post-op and was able to be d/c home on 09-04-12. He returnedon 12-28-12 with back pain and confusion. In ED he was found to have lower abd/supra-pubic pain, temp 102.9. He was found to have an UCx growing Enterococcus (sens Amp, Vanco). He was started on ceftriaxone. He was also found to have a soft tissue mass on his lower lumbar area. He underwent CT scan of the abdomen on 12/28/2012 showing abnormal soft tissue prevertebral area as well as indistinct endplates at L4-5 with lucency around the pedicle screw at L5. He underwent surgery by Dr. Franky Macho with POSTERIOR LUMBAR FUSION 1 WITH HARDWARE REMOVAL L5 pedicle screw. Bone cultures grew   PSEUDOMONAS AERUGINOSA       Antibiotic  Sensitivity  Microscan  Status     CEFEPIME  Sensitive  <=1  Final     Method:  MIC     CEFTAZIDIME  Sensitive  <=1  Final     Method:  MIC     CIPROFLOXACIN  Sensitive  <=0.25  Final     Method:  MIC     GENTAMICIN  Sensitive  <=1  Final     Method:  MIC     IMIPENEM  Sensitive  2  Final     Method:  MIC     PIP/TAZO  Sensitive  <=4  Final     Method:  MIC     TOBRAMYCIN  Sensitive  <=1  Final     He was changed to Cefepime 2 grams IV q 12 hours which I then changed to 2 grams IV q 8 hours and then changed to chronic oral cipro bid.  He had reported dramatic improvement in his LBP now at 4-5/10 more so with exertion. He states ESR are now normal  He had been upset about reschedule of clinic appt x 3 and I had called him while I was on vacation to review his case and to reschedule his appt. As states most recent esr and CRP were BOTH normal.   I have advised that he stay on active drug vs the pseudomonas for at least  A year if not longer due to concern for deep infection  infovling other hardware.   Review of Systems  Constitutional: Negative for chills, diaphoresis, activity change, appetite change, fatigue and unexpected weight change.  HENT: Negative for congestion, sore throat, rhinorrhea, sneezing, trouble swallowing and sinus pressure.   Eyes: Negative for photophobia and visual disturbance.  Respiratory: Negative for cough, chest tightness, shortness of breath, wheezing and stridor.   Cardiovascular: Negative for palpitations and leg swelling.  Gastrointestinal: Positive for constipation. Negative for nausea, vomiting, diarrhea, blood in stool, abdominal distention and anal bleeding.  Genitourinary: Negative for hematuria, flank pain and difficulty urinating.  Musculoskeletal: Negative for myalgias, joint swelling, arthralgias and gait problem.  Skin: Negative for color change, pallor, rash and wound.  Neurological: Negative for dizziness, tremors and light-headedness.  Hematological: Negative for adenopathy. Does not bruise/bleed easily.  Psychiatric/Behavioral: Negative for behavioral problems, confusion, sleep disturbance, dysphoric mood, decreased concentration and agitation.       Objective:   Physical Exam  Constitutional: He is oriented to person, place, and time. He appears well-developed and well-nourished. No distress.  HENT:  Head: Normocephalic and atraumatic.  Mouth/Throat: Oropharynx is clear and moist. No oropharyngeal exudate.  Eyes: Conjunctivae and EOM are normal. Pupils are equal, round, and reactive to light. No scleral icterus.  Neck: Normal range of motion. Neck supple. No JVD present.  Cardiovascular: Normal rate, regular rhythm and normal heart sounds.  Exam reveals no gallop and no friction rub.   No murmur heard. Pulmonary/Chest: Effort normal and breath sounds normal. No respiratory distress. He has no wheezes. He has no rales. He exhibits no tenderness.  Abdominal: He exhibits no distension and no mass. There is no  tenderness. There is no rebound and no guarding.  Musculoskeletal: He exhibits no edema and no tenderness.  Lymphadenopathy:    He has no cervical adenopathy.  Neurological: He is alert and oriented to person, place, and time. He exhibits normal muscle tone.  Skin: Skin is warm and dry. He is not diaphoretic. No erythema. No pallor.     Psychiatric: He has a normal mood and affect. His behavior is normal. Judgment and thought content normal.       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 I suggested dc iron since he is essentially NOT anemic anymore but he should take up with PCP

## 2013-07-24 ENCOUNTER — Ambulatory Visit: Payer: Medicare Other | Admitting: Infectious Disease

## 2013-07-28 ENCOUNTER — Other Ambulatory Visit: Payer: Self-pay | Admitting: Infectious Disease

## 2013-07-28 DIAGNOSIS — M869 Osteomyelitis, unspecified: Secondary | ICD-10-CM

## 2013-07-28 DIAGNOSIS — E1169 Type 2 diabetes mellitus with other specified complication: Secondary | ICD-10-CM

## 2013-07-30 ENCOUNTER — Other Ambulatory Visit: Payer: Self-pay

## 2013-07-31 ENCOUNTER — Emergency Department (HOSPITAL_COMMUNITY)
Admission: EM | Admit: 2013-07-31 | Discharge: 2013-07-31 | Disposition: A | Discharge status: Home / Self Care | Payer: Medicare Other | Source: Home / Self Care | Attending: Emergency Medicine | Admitting: Emergency Medicine

## 2013-07-31 ENCOUNTER — Emergency Department (INDEPENDENT_AMBULATORY_CARE_PROVIDER_SITE_OTHER): Payer: Medicare Other

## 2013-07-31 DIAGNOSIS — M25539 Pain in unspecified wrist: Secondary | ICD-10-CM

## 2013-07-31 DIAGNOSIS — M25531 Pain in right wrist: Secondary | ICD-10-CM

## 2013-07-31 MED ORDER — DICLOFENAC EPOLAMINE 1.3 % TD PTCH: 1.0000 | MEDICATED_PATCH | Freq: Two times a day (BID) | TRANSDERMAL | Status: DC

## 2013-07-31 NOTE — ED Notes (Signed)
C/o right wrist injury as he was reaching to unplug a cord that was covered by a desk around May/June.

## 2013-07-31 NOTE — ED Provider Notes (Signed)
CSN: [id number]     Arrival date & time 07/31/13  1345 History     First MD Initiated Contact with Patient 07/31/13 1433     Chief Complaint  Patient presents with  . Wrist Injury   (Consider location/radiation/quality/duration/timing/severity/associated sxs/prior Treatment) HPI Comments: 67 year old male presents for evaluation of pain in the ulnar side of his right wrist for the past month. She was reaching up behind a desk with the wrist in flexion to reach something behind the desk when he thinks he injured the wrist. He did not initially have severe pain but since then has had constant pain in the area described that is worse with movement. It is also made worse by any gripping or shaking hands. He denies any instability in arrest, previous history of wrist pain, or swelling. He states he just wants to get an x-ray to make sure nothing is broken    Past Medical History  Diagnosis Date  . Hypertension   . Diabetes mellitus   . GERD (gastroesophageal reflux disease)   . H/O hiatal hernia   . Arthritis   . Sleep apnea     . Last test was before 2007 , not sure name of test site.   Past Surgical History  Procedure Laterality Date  . Colonoscopy  2011  . Hiatal hernia repair      x2  . Eye surgery      Cataract 2013  . Knee arthroscopy      bil  . Patella fracture surgery     Family History  Problem Relation Age of Onset  . Coronary artery disease     History  Substance Use Topics  . Smoking status: Former Smoker -- 16 years    Quit date: 09/04/1980  . Smokeless tobacco: Not on file  . Alcohol Use: No     Comment: rarely    Review of Systems  Constitutional: Negative for fever, chills and fatigue.  Eyes: Negative for visual disturbance.  Respiratory: Negative for cough and shortness of breath.   Cardiovascular: Negative for chest pain, palpitations and leg swelling.  Gastrointestinal: Negative for nausea, vomiting, abdominal pain, diarrhea and constipation.   Musculoskeletal: Positive for arthralgias (see HPI). Negative for myalgias.  Skin: Negative for rash.  Neurological: Negative for dizziness, weakness and light-headedness.    Allergies  Review of patient's allergies indicates no known allergies.  Home Medications   Current Outpatient Rx  Name  Route  Sig  Dispense  Refill  . amLODipine (NORVASC) 10 MG tablet   Oral   Take 5 mg by mouth daily.          Marland Kitchen aspirin 325 MG tablet   Oral   Take 325 mg by mouth daily.         . ciprofloxacin (CIPRO) 500 MG tablet      TAKE 1 TABLET TWICE A DAY   60 tablet   5   . furosemide (LASIX) 40 MG tablet   Oral   Take 40 mg by mouth daily.         . insulin aspart (NOVOLOG) 100 UNIT/ML injection   Subcutaneous   Inject 30 Units into the skin 3 (three) times daily before meals.          . insulin glargine (LANTUS) 100 UNIT/ML injection   Subcutaneous   Inject 80 Units into the skin at bedtime.         Marland Kitchen losartan (COZAAR) 100 MG tablet   Oral  Take 100 mg by mouth daily.         . metFORMIN (GLUCOPHAGE) 1000 MG tablet   Oral   Take 1,000-1,500 mg by mouth 2 (two) times daily with a meal. 1000mg  in the morning; 1500mg  in the evening         . Multiple Vitamin (MULTIVITAMIN WITH MINERALS) TABS   Oral   Take 1 tablet by mouth daily.         . Nutritional Supplements (PYCNOGENOL) 154-25 MG CAPS   Oral   Take 1 capsule by mouth daily.         Marland Kitchen oxyCODONE-acetaminophen (PERCOCET) 10-325 MG per tablet   Oral   Take 1 tablet by mouth every 4 (four) hours as needed. For pain   120 tablet   0   . polyethylene glycol (MIRALAX / GLYCOLAX) packet   Oral   Take 17 g by mouth daily as needed.   14 each   11   . potassium chloride SA (K-DUR,KLOR-CON) 20 MEQ tablet   Oral   Take 20 mEq by mouth daily.         Marland Kitchen RESVERATROL 100 MG CAPS   Oral   Take 1 capsule by mouth daily.         Marland Kitchen senna-docusate (SENOKOT-S) 8.6-50 MG per tablet   Oral   Take 2  tablets by mouth at bedtime.   60 tablet   5   . simvastatin (ZOCOR) 20 MG tablet   Oral   Take 20 mg by mouth every evening.         . tadalafil (CIALIS) 20 MG tablet   Oral   Take 20 mg by mouth daily as needed.         . diclofenac (FLECTOR) 1.3 % PTCH   Transdermal   Place 1 patch onto the skin 2 (two) times daily.   30 patch   0   . ferrous sulfate 325 (65 FE) MG tablet   Oral   Take 1 tablet (325 mg total) by mouth daily with breakfast.   30 tablet   0    BP 132/85  Pulse 80  Temp(Src) 98.4 F (36.9 C) (Oral)  Resp 16  SpO2 100% Physical Exam  Nursing note and vitals reviewed. Constitutional: He is oriented to person, place, and time. He appears well-developed and well-nourished. No distress.  HENT:  Head: Normocephalic and atraumatic.  Musculoskeletal:       Right wrist: He exhibits tenderness (Tenderness directly proximal to the ulnar styloid ). He exhibits normal range of motion, no bony tenderness, no swelling, no effusion, no crepitus and no deformity.  Neurological: He is oriented to person, place, and time.  Skin: Skin is warm and dry. No rash noted.  Psychiatric: He has a normal mood and affect. Judgment normal.    ED Course   Procedures (including critical care time)  Labs Reviewed - No data to display Dg Wrist Complete Right  07/31/2013   *RADIOLOGY REPORT*  Clinical Data: Wrist pain  RIGHT WRIST - COMPLETE 3+ VIEW  Comparison: None.  Findings: No acute fracture or dislocation is noted.  No soft tissue abnormality is noted.  IMPRESSION: No acute abnormalities seen.   Original Report Authenticated By: Alcide Clever, M.D.   1. Wrist pain, right     MDM  This patient likely has ulnar styloid synovitis. We'll put in a wrist splint and a flector patch over the painful area. Given a referral for hand surgery, he  will followup there if this does not improve.   Meds ordered this encounter  Medications  . diclofenac (FLECTOR) 1.3 % PTCH    Sig:  Place 1 patch onto the skin 2 (two) times daily.    Dispense:  30 patch    Refill:  0     Graylon Good, PA-C 07/31/13 2103

## 2013-08-05 NOTE — ED Provider Notes (Signed)
Medical screening examination/treatment/procedure(s) were performed by non-physician practitioner and as supervising physician I was immediately available for consultation/collaboration.  Leslee Home, M.D.  Reuben Likes, MD 08/05/13 2124

## 2013-08-07 ENCOUNTER — Other Ambulatory Visit: Payer: Self-pay | Admitting: Licensed Clinical Social Worker

## 2013-08-07 DIAGNOSIS — M869 Osteomyelitis, unspecified: Secondary | ICD-10-CM

## 2013-08-07 MED ORDER — LEVOFLOXACIN 750 MG PO TABS: 750.0000 mg | ORAL_TABLET | Freq: Every day | ORAL | Status: DC

## 2013-08-11 ENCOUNTER — Encounter: Payer: Self-pay | Admitting: Infectious Disease

## 2013-08-30 ENCOUNTER — Encounter: Payer: Self-pay | Admitting: Infectious Disease

## 2013-09-08 ENCOUNTER — Other Ambulatory Visit: Payer: Self-pay | Admitting: Neurosurgery

## 2013-09-08 DIAGNOSIS — M47816 Spondylosis without myelopathy or radiculopathy, lumbar region: Secondary | ICD-10-CM

## 2013-09-10 ENCOUNTER — Ambulatory Visit
Admission: RE | Admit: 2013-09-10 | Discharge: 2013-09-10 | Disposition: A | Discharge status: Home / Self Care | Payer: Medicare Other | Source: Ambulatory Visit | Attending: Neurosurgery | Admitting: Neurosurgery

## 2013-09-10 DIAGNOSIS — M47816 Spondylosis without myelopathy or radiculopathy, lumbar region: Secondary | ICD-10-CM

## 2013-10-23 ENCOUNTER — Encounter: Payer: Self-pay | Admitting: Infectious Disease

## 2013-10-24 ENCOUNTER — Other Ambulatory Visit: Payer: Self-pay | Admitting: Neurosurgery

## 2013-10-24 DIAGNOSIS — M48061 Spinal stenosis, lumbar region without neurogenic claudication: Secondary | ICD-10-CM

## 2013-11-06 ENCOUNTER — Ambulatory Visit
Admission: RE | Admit: 2013-11-06 | Discharge: 2013-11-06 | Disposition: A | Discharge status: Home / Self Care | Payer: Medicare Other | Source: Ambulatory Visit | Attending: Neurosurgery | Admitting: Neurosurgery

## 2013-11-06 DIAGNOSIS — M48061 Spinal stenosis, lumbar region without neurogenic claudication: Secondary | ICD-10-CM

## 2013-11-06 MED ORDER — GADOBENATE DIMEGLUMINE 529 MG/ML IV SOLN: 20.0000 mL | Freq: Once | INTRAVENOUS | Status: AC | PRN

## 2013-11-28 ENCOUNTER — Encounter: Payer: Self-pay | Admitting: Infectious Disease

## 2014-01-12 ENCOUNTER — Encounter: Payer: Self-pay | Admitting: Infectious Disease

## 2014-01-13 IMAGING — CR DG LUMBAR SPINE 2-3V
3 series · 4 of 4 positions shown · non-contrast
Comparison: Lumbar spine radiographs 08/12/2012

CLINICAL DATA: L3-4 and L4-5 PLIF

LUMBAR SPINE - 2-3 VIEW

[view not recorded (1 of 3)]
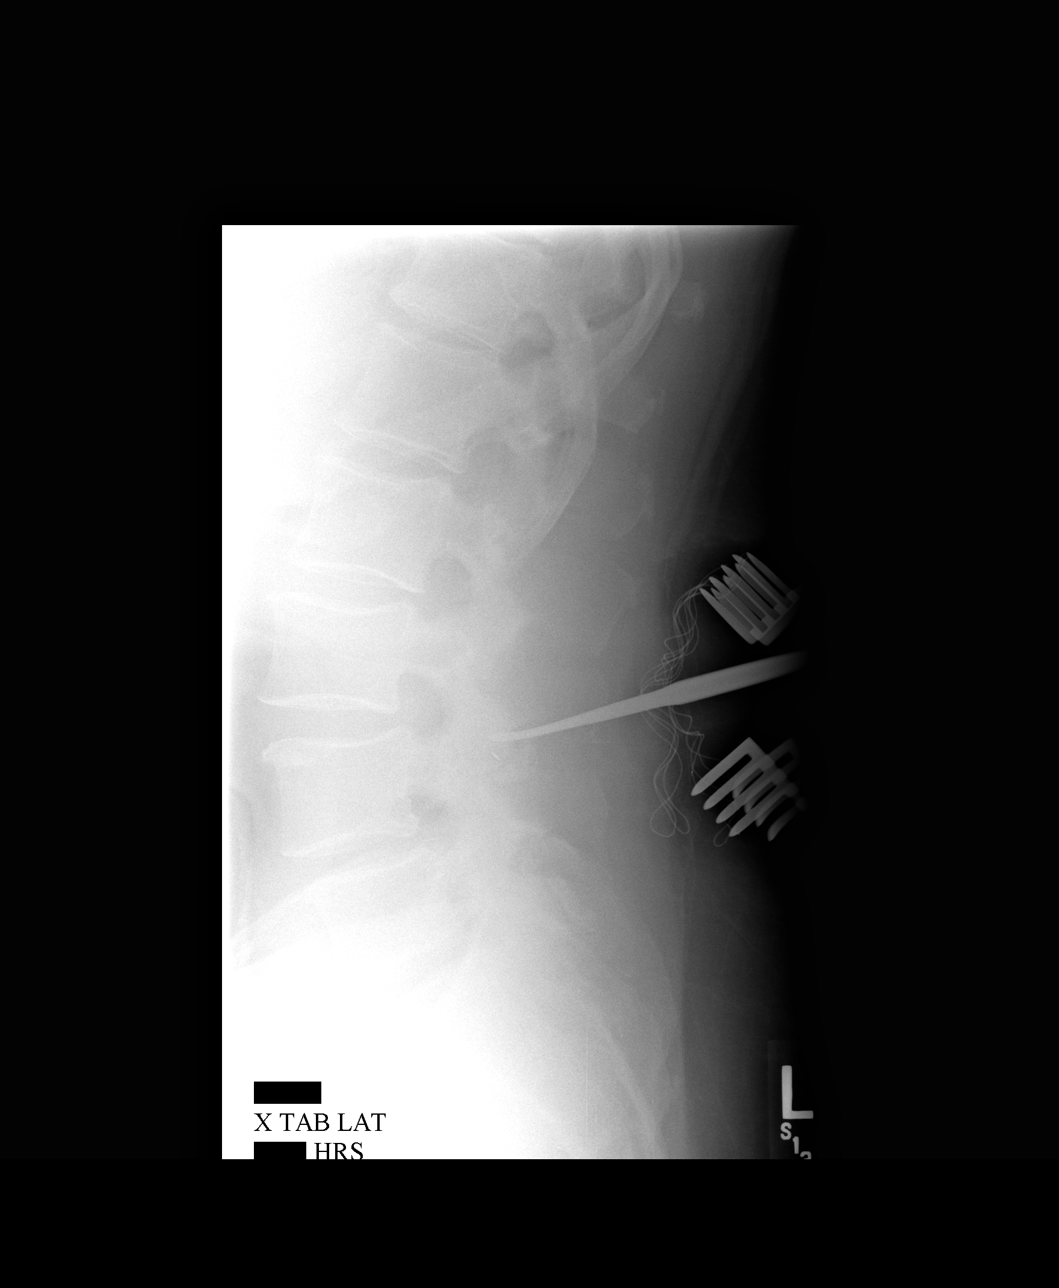

[view not recorded (2 of 3)]
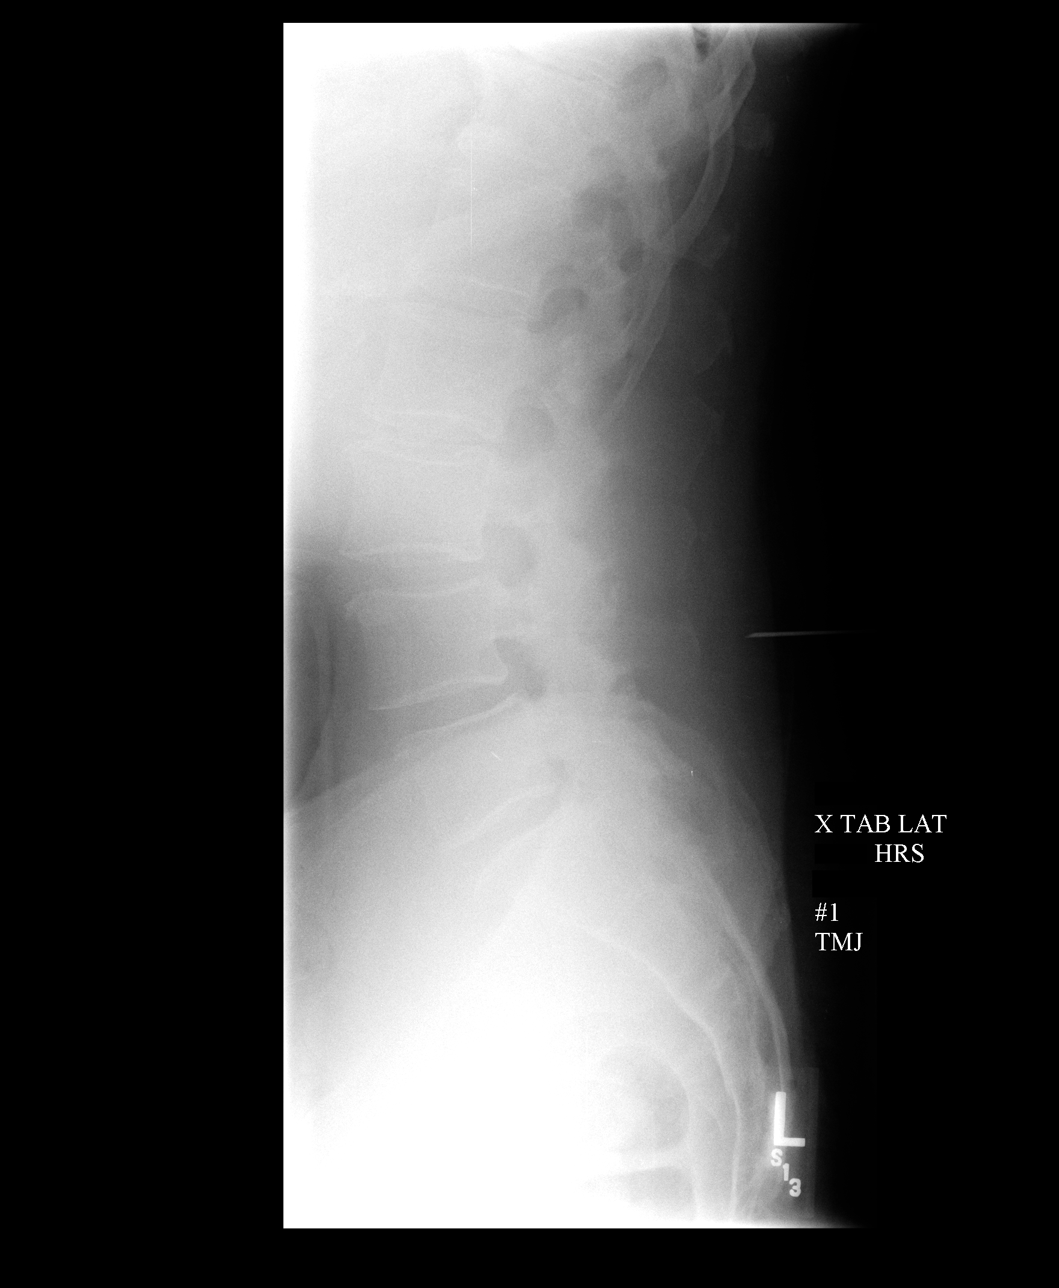

[Series 1003: view not recorded · 0.20mm/px · 2 of 2 slices shown (3 of 3)]
[im 1/2]
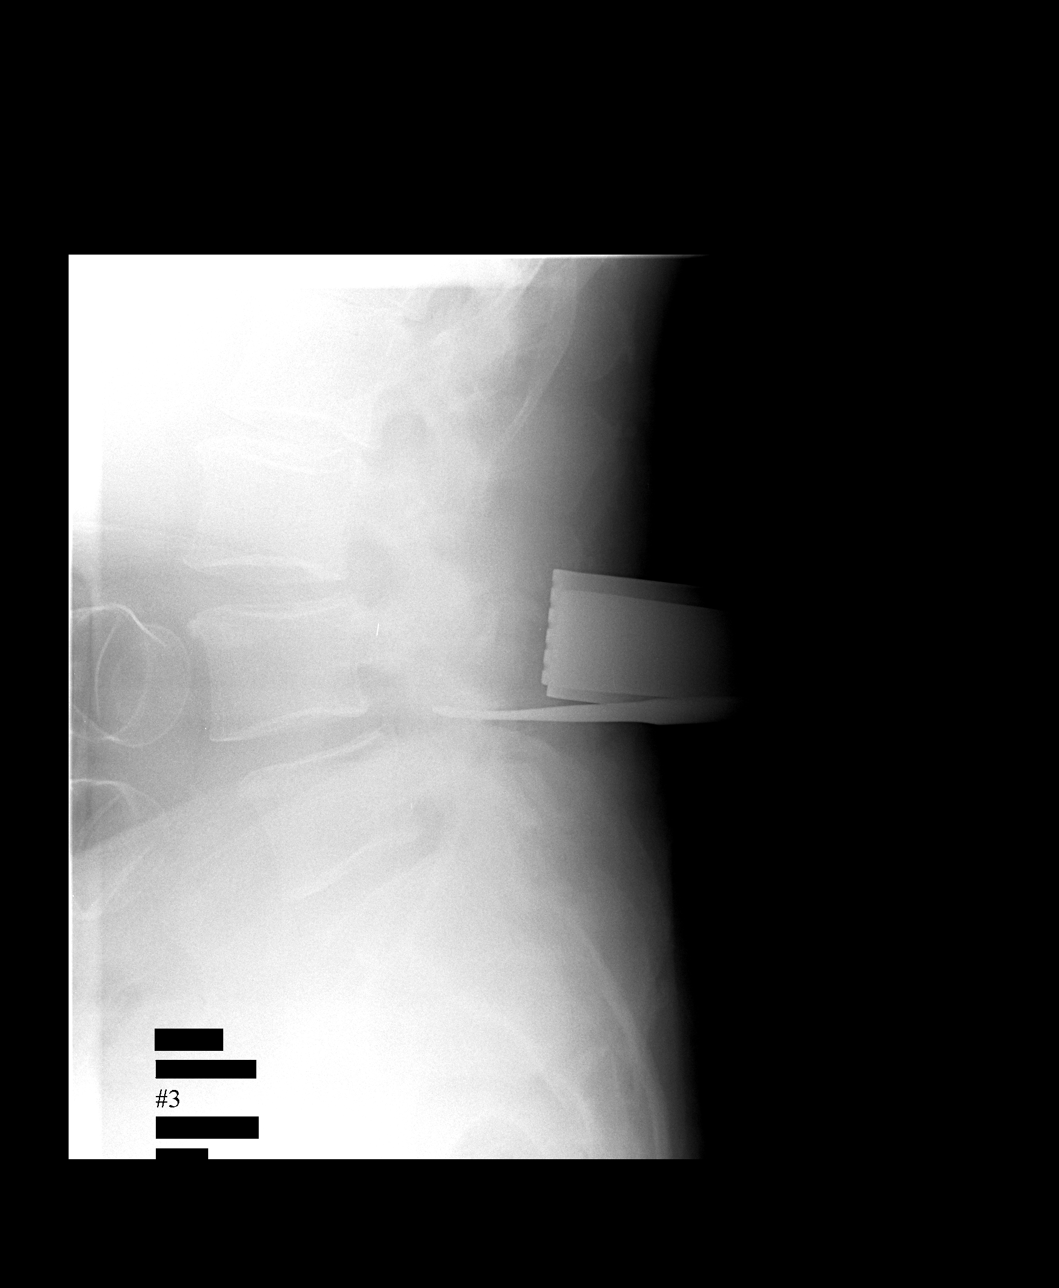
[im 2/2]
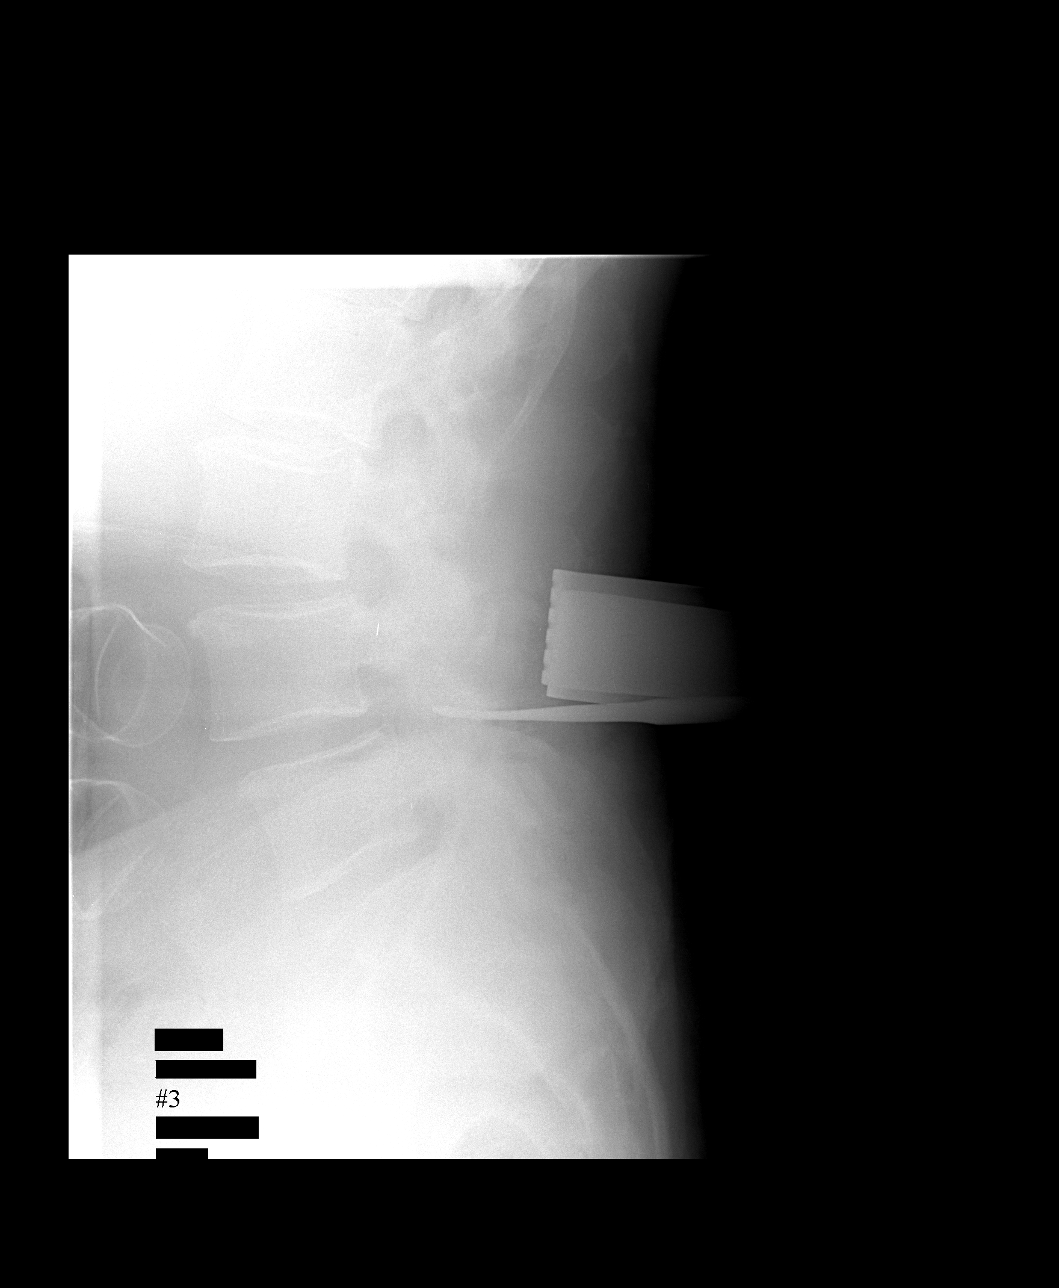

[4 of 4 positions shown; findings below may reference images not displayed]

FINDINGS: A total of three cross-table lateral views of the lumbar
spine are submitted.

Image #1 at [DATE] hours demonstrates five lumbar-type vertebral
bodies.  Approximately 3 mm anterior listhesis of L4 on L5, stable.
Remainder of the lumbar spine appears aligned.  Moderate facet
joint degenerative changes spanning L2-L5.  Vertebral body heights
maintained.

Image #2 demonstrates metallic surgical device posterior to the L4
vertebral body, near the level of the superior endplate of L4.

Image #3 shows metallic surgical probe directly posterior to the L4-
L5 disc space.
IMPRESSION: Lateral views of the lumbar spine, intraoperative, as
described above.

## 2014-01-15 ENCOUNTER — Encounter: Payer: Self-pay | Admitting: Infectious Disease

## 2014-01-15 ENCOUNTER — Ambulatory Visit (INDEPENDENT_AMBULATORY_CARE_PROVIDER_SITE_OTHER): Payer: Medicare Other | Admitting: Infectious Disease

## 2014-01-15 VITALS — BP 128/80 | HR 82 | Wt 251.4 lb

## 2014-01-15 DIAGNOSIS — M4620 Osteomyelitis of vertebra, site unspecified: Secondary | ICD-10-CM

## 2014-01-15 DIAGNOSIS — M869 Osteomyelitis, unspecified: Secondary | ICD-10-CM

## 2014-01-15 LAB — C-REACTIVE PROTEIN: CRP: 0.8 mg/dL — ABNORMAL HIGH (ref <0.60)

## 2014-01-15 LAB — SEDIMENTATION RATE: Sed Rate: 15 mm/hr (ref 0–16)

## 2014-01-15 MED ORDER — LEVOFLOXACIN 750 MG PO TABS: 750.0000 mg | ORAL_TABLET | Freq: Every day | ORAL | Status: DC

## 2014-01-15 NOTE — Progress Notes (Signed)
Subjective:    Patient ID: Kevin [name], male    DOB: 1946/04/28, 68 y.o.   MRN: [id number]  HPI   68 yo Mw with hx DM2 (since 1997) and spinal stenosis. On 08-30-12 under going decompression, screws, cage and autograft L3-5. He did well post-op and was able to be d/c home on 09-04-12. He returnedon 12-28-12 with back pain and confusion. In ED he was found to have lower abd/supra-pubic pain, temp 102.9. He was found to have an UCx growing Enterococcus (sens Amp, Vanco). He was started on ceftriaxone. He was also found to have a soft tissue mass on his lower lumbar area. He underwent CT scan of the abdomen on 12/28/2012 showing abnormal soft tissue prevertebral area as well as indistinct endplates at L4-5 with lucency around the pedicle screw at L5. He underwent surgery by Dr. Franky Macho with POSTERIOR LUMBAR FUSION 1 WITH HARDWARE REMOVAL L5 pedicle screw. Bone cultures grew   PSEUDOMONAS AERUGINOSA       Antibiotic  Sensitivity  Microscan  Status     CEFEPIME  Sensitive  <=1  Final     Method:  MIC     CEFTAZIDIME  Sensitive  <=1  Final     Method:  MIC     CIPROFLOXACIN  Sensitive  <=0.25  Final     Method:  MIC     GENTAMICIN  Sensitive  <=1  Final     Method:  MIC     IMIPENEM  Sensitive  2  Final     Method:  MIC     PIP/TAZO  Sensitive  <=4  Final     Method:  MIC     TOBRAMYCIN  Sensitive  <=1  Final     He was changed to Cefepime 2 grams IV q 12 hours which I then changed to 2 grams IV q 8 hours and then changed to chronic oral cipro bid.  He had reported dramatic improvement in his LBP now at 4-5/10 more so with exertion. He states ESR are now normal  He had been upset about reschedule of clinic appt x 3 and I had called him while I was on vacation to review his case and to reschedule his appt. As states most recent esr and CRP were BOTH normal.   I have advised that he stay on active drug vs the pseudomonas for at least  A year if not longer due to concern for deep infection  infovling other hardware.  Lying on back pain is about a 2/10. Walking around a 4/10  He has had two hernia inguinal surgeries. But neither ws successful.  He is seeing a Careers adviser at [facility] for a 2nd opinion re his back.    Review of Systems  Constitutional: Negative for chills, diaphoresis, activity change, appetite change, fatigue and unexpected weight change.  HENT: Negative for congestion, rhinorrhea, sinus pressure, sneezing, sore throat and trouble swallowing.   Eyes: Negative for photophobia and visual disturbance.  Respiratory: Negative for cough, chest tightness, shortness of breath, wheezing and stridor.   Cardiovascular: Negative for palpitations and leg swelling.  Gastrointestinal: Negative for nausea, vomiting, diarrhea, blood in stool, abdominal distention and anal bleeding.  Genitourinary: Negative for hematuria, flank pain and difficulty urinating.  Musculoskeletal: Positive for back pain. Negative for arthralgias, gait problem, joint swelling and myalgias.  Skin: Negative for color change, pallor, rash and wound.  Neurological: Negative for dizziness, tremors and light-headedness.  Hematological: Negative for adenopathy. Does not bruise/bleed easily.  Psychiatric/Behavioral: Negative for behavioral problems, confusion, sleep disturbance, dysphoric mood, decreased concentration and agitation.       Objective:   Physical Exam  Constitutional: He is oriented to person, place, and time. He appears well-developed and well-nourished. No distress.  HENT:  Head: Normocephalic and atraumatic.  Mouth/Throat: Oropharynx is clear and moist. No oropharyngeal exudate.  Eyes: Conjunctivae and EOM are normal. Pupils are equal, round, and reactive to light. No scleral icterus.  Neck: Normal range of motion. Neck supple. No JVD present.  Cardiovascular: Normal rate, regular rhythm and normal heart sounds.  Exam reveals no gallop and no friction rub.   No murmur heard. Pulmonary/Chest:  Effort normal and breath sounds normal. No respiratory distress. He has no wheezes. He has no rales. He exhibits no tenderness.  Abdominal: He exhibits no distension and no mass. There is no tenderness. There is no rebound and no guarding.  Musculoskeletal: He exhibits no edema and no tenderness.  Lymphadenopathy:    He has no cervical adenopathy.  Neurological: He is alert and oriented to person, place, and time. He exhibits normal muscle tone.  Skin: Skin is warm and dry. He is not diaphoretic. No erythema. No pallor.     Psychiatric: He has a normal mood and affect. His behavior is normal. Judgment and thought content normal.       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 We cannot prove mechanism of introduction of this pathogen but certainly cannot rule out possibility of this being something introduced at time of surgery vs dissemination from another site from another source.   Because infection associated with hardware is notoriously difficult to treat and because he still DOES have a broken pin and hardware in his spine, my preference would be to continue his oral antibiotics and to continue to reasess  --continue Levaquin 750mg  daily --check esr, crp today --rtc in 6 months  I spent greater than 69minutes with the patient including greater than 50% of time in face to face counsel of the patient and in coordination of their care.

## 2014-01-15 NOTE — Patient Instructions (Signed)
#  1 the organism in the urine was not related to organism in the bone in spine (pseudomonas)  #2 infections involving hardware are very hard to treat and require a year if not longer at times requiring antibiotics for life

## 2014-01-17 ENCOUNTER — Encounter: Payer: Self-pay | Admitting: Infectious Disease

## 2014-01-26 ENCOUNTER — Other Ambulatory Visit: Payer: Self-pay | Admitting: Licensed Clinical Social Worker

## 2014-01-26 DIAGNOSIS — M869 Osteomyelitis, unspecified: Secondary | ICD-10-CM

## 2014-01-26 MED ORDER — LEVOFLOXACIN 750 MG PO TABS: 750.0000 mg | ORAL_TABLET | Freq: Every day | ORAL | Status: DC

## 2014-03-12 ENCOUNTER — Ambulatory Visit: Payer: Self-pay | Admitting: Podiatry

## 2014-07-15 ENCOUNTER — Encounter: Payer: Self-pay | Admitting: Infectious Disease

## 2014-07-15 ENCOUNTER — Ambulatory Visit (INDEPENDENT_AMBULATORY_CARE_PROVIDER_SITE_OTHER): Payer: Medicare Other | Admitting: Infectious Disease

## 2014-07-15 VITALS — BP 100/65 | HR 74 | Temp 98.0°F | Wt 251.5 lb

## 2014-07-15 DIAGNOSIS — A498 Other bacterial infections of unspecified site: Secondary | ICD-10-CM

## 2014-07-15 DIAGNOSIS — Z9889 Other specified postprocedural states: Secondary | ICD-10-CM

## 2014-07-15 DIAGNOSIS — Z8719 Personal history of other diseases of the digestive system: Secondary | ICD-10-CM

## 2014-07-15 DIAGNOSIS — B965 Pseudomonas (aeruginosa) (mallei) (pseudomallei) as the cause of diseases classified elsewhere: Secondary | ICD-10-CM

## 2014-07-15 DIAGNOSIS — M869 Osteomyelitis, unspecified: Secondary | ICD-10-CM

## 2014-07-15 DIAGNOSIS — M4620 Osteomyelitis of vertebra, site unspecified: Secondary | ICD-10-CM

## 2014-07-15 LAB — CBC WITH DIFFERENTIAL/PLATELET
Basophils Absolute: 0.1 10*3/uL (ref 0.0–0.1)
Basophils Relative: 1 % (ref 0–1)
Eosinophils Absolute: 0.3 10*3/uL (ref 0.0–0.7)
Eosinophils Relative: 5 % (ref 0–5)
HCT: 41.9 % (ref 39.0–52.0)
Hemoglobin: 13.6 g/dL (ref 13.0–17.0)
Lymphocytes Relative: 35 % (ref 12–46)
Lymphs Abs: 2 10*3/uL (ref 0.7–4.0)
MCH: 26.5 pg (ref 26.0–34.0)
MCHC: 32.5 g/dL (ref 30.0–36.0)
MCV: 81.7 fL (ref 78.0–100.0)
Monocytes Absolute: 0.3 10*3/uL (ref 0.1–1.0)
Monocytes Relative: 6 % (ref 3–12)
Neutro Abs: 3 10*3/uL (ref 1.7–7.7)
Neutrophils Relative %: 53 % (ref 43–77)
Platelets: 240 10*3/uL (ref 150–400)
RBC: 5.13 MIL/uL (ref 4.22–5.81)
RDW: 14.9 % (ref 11.5–15.5)
WBC: 5.6 10*3/uL (ref 4.0–10.5)

## 2014-07-15 NOTE — Progress Notes (Signed)
Subjective:    Patient ID: Kevin [name], male    DOB: 07-08-1946, 68 y.o.   MRN: [id number]  HPI   68 yo Mw with hx DM2 (since 1997) and spinal stenosis. On 08-30-12 under going decompression, screws, cage and autograft L3-5. He did well post-op and was able to be d/c home on 09-04-12. He returnedon 12-28-12 with back pain and confusion. In ED he was found to have lower abd/supra-pubic pain, temp 102.9. He was found to have an UCx growing Enterococcus (sens Amp, Vanco). He was started on ceftriaxone. He was also found to have a soft tissue mass on his lower lumbar area. He underwent CT scan of the abdomen on 12/28/2012 showing abnormal soft tissue prevertebral area as well as indistinct endplates at L4-5 with lucency around the pedicle screw at L5. He underwent surgery by Dr. Franky Macho with POSTERIOR LUMBAR FUSION 1 WITH HARDWARE REMOVAL L5 pedicle screw. Bone cultures grew   PSEUDOMONAS AERUGINOSA       Antibiotic  Sensitivity  Microscan  Status     CEFEPIME  Sensitive  <=1  Final     Method:  MIC     CEFTAZIDIME  Sensitive  <=1  Final     Method:  MIC     CIPROFLOXACIN  Sensitive  <=0.25  Final     Method:  MIC     GENTAMICIN  Sensitive  <=1  Final     Method:  MIC     IMIPENEM  Sensitive  2  Final     Method:  MIC     PIP/TAZO  Sensitive  <=4  Final     Method:  MIC     TOBRAMYCIN  Sensitive  <=1  Final     He was changed to Cefepime 2 grams IV q 12 hours which I then changed to 2 grams IV q 8 hours and then changed to chronic oral cipro bid.  He had reported dramatic improvement in his LBP now at 4-5/10 more so with exertion. He states ESR are now normal   He is seeing a Careers adviser at [facility] for a 2nd opinion re his back.   Is also undergone another hernia surgery at [facility] in March.  His back pain is worse in the hernia surgery in March I think now to the right time to take him off antibiotics.     Review of Systems  Constitutional: Negative for chills, diaphoresis, activity  change, appetite change, fatigue and unexpected weight change.  HENT: Negative for congestion, rhinorrhea, sinus pressure, sneezing, sore throat and trouble swallowing.   Eyes: Negative for photophobia and visual disturbance.  Respiratory: Negative for cough, chest tightness, shortness of breath, wheezing and stridor.   Cardiovascular: Negative for palpitations and leg swelling.  Gastrointestinal: Negative for nausea, vomiting, diarrhea, blood in stool, abdominal distention and anal bleeding.  Genitourinary: Negative for hematuria, flank pain and difficulty urinating.  Musculoskeletal: Positive for back pain. Negative for arthralgias, gait problem, joint swelling and myalgias.  Skin: Negative for color change, pallor, rash and wound.  Neurological: Negative for dizziness, tremors and light-headedness.  Hematological: Negative for adenopathy. Does not bruise/bleed easily.  Psychiatric/Behavioral: Negative for behavioral problems, confusion, sleep disturbance, dysphoric mood, decreased concentration and agitation.       Objective:   Physical Exam  Constitutional: He is oriented to person, place, and time. He appears well-developed and well-nourished. No distress.  HENT:  Head: Normocephalic and atraumatic.  Mouth/Throat: Oropharynx is clear and moist. No oropharyngeal exudate.  Eyes: Conjunctivae and EOM are normal. No scleral icterus.  Neck: Normal range of motion. Neck supple. No JVD present.  Cardiovascular: Normal rate and regular rhythm.   Pulmonary/Chest: No respiratory distress. He has no wheezes.  Abdominal: He exhibits no distension. There is no tenderness.  Musculoskeletal: He exhibits no edema and no tenderness.  Neurological: He is alert and oriented to person, place, and time. He exhibits normal muscle tone.  Skin: Skin is warm and dry. He is not diaphoretic. No erythema. No pallor.  Psychiatric: He has a normal mood and affect. His behavior is normal. Judgment and thought  content normal.       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   Because infection associated with hardware is notoriously difficult to treat and because he still DOES have a broken pin and hardware in his spine, my preference would be to continue his oral antibiotics and to continue to reasess  --continue Levaquin 750mg  daily --check esr, crp today --rtc in 6 months  Again today is very able to assess meaning of his pain in the context of recent abdominal surgery.   I spent greater than 25 minutes with the patient including greater than 50% of time in face to face counsel of the patient and in coordination of their care.

## 2014-07-16 LAB — C-REACTIVE PROTEIN: CRP: 0.6 mg/dL — ABNORMAL HIGH (ref <0.60)

## 2014-07-16 LAB — SEDIMENTATION RATE: Sed Rate: 18 mm/hr — ABNORMAL HIGH (ref 0–16)

## 2014-07-16 LAB — BASIC METABOLIC PANEL WITH GFR
BUN: 10 mg/dL (ref 6–23)
CO2: 27 mEq/L (ref 19–32)
Calcium: 9.2 mg/dL (ref 8.4–10.5)
Chloride: 101 mEq/L (ref 96–112)
Creat: 0.73 mg/dL (ref 0.50–1.35)
GFR, Est African American: >89 mL/min
GFR, Est Non African American: >89 mL/min
Glucose, Bld: 75 mg/dL (ref 70–99)
Potassium: 4.1 mEq/L (ref 3.5–5.3)
Sodium: 138 mEq/L (ref 135–145)

## 2014-07-22 ENCOUNTER — Encounter: Payer: Self-pay | Admitting: Infectious Disease

## 2015-01-21 ENCOUNTER — Ambulatory Visit (INDEPENDENT_AMBULATORY_CARE_PROVIDER_SITE_OTHER): Payer: Medicare Other | Admitting: Infectious Disease

## 2015-01-21 ENCOUNTER — Encounter: Payer: Self-pay | Admitting: Infectious Disease

## 2015-01-21 VITALS — BP 120/74 | HR 80 | Temp 98.0°F | Ht 73.0 in | Wt 247.0 lb

## 2015-01-21 DIAGNOSIS — M4620 Osteomyelitis of vertebra, site unspecified: Secondary | ICD-10-CM

## 2015-01-21 DIAGNOSIS — M4646 Discitis, unspecified, lumbar region: Secondary | ICD-10-CM

## 2015-01-21 DIAGNOSIS — A498 Other bacterial infections of unspecified site: Secondary | ICD-10-CM

## 2015-01-21 LAB — C-REACTIVE PROTEIN: CRP: 0.8 mg/dL — ABNORMAL HIGH (ref <0.60)

## 2015-01-21 LAB — SEDIMENTATION RATE: Sed Rate: 10 mm/hr (ref 0–16)

## 2015-01-21 NOTE — Progress Notes (Signed)
Subjective:    Patient ID: Kevin [name], male    DOB: [birth year], 69 y.o.   MRN: [id number]  HPI   69 yo Mw with hx DM2 (since 1997) and spinal stenosis. On 08-30-12 under going decompression, screws, cage and autograft L3-5. He did well post-op and was able to be d/c home on 09-04-12. He returnedon 12-28-12 with back pain and confusion. In ED he was found to have lower abd/supra-pubic pain, temp 102.9. He was found to have an UCx growing Enterococcus (sens Amp, Vanco). He was started on ceftriaxone. He was also found to have a soft tissue mass on his lower lumbar area. He underwent CT scan of the abdomen on 12/28/2012 showing abnormal soft tissue prevertebral area as well as indistinct endplates at O3-7 with lucency around the pedicle screw at L5. He underwent surgery by Dr. Christella Noa with POSTERIOR LUMBAR FUSION 1 WITH HARDWARE REMOVAL L5 pedicle screw. Bone cultures grew   PSEUDOMONAS AERUGINOSA       Antibiotic  Sensitivity  Microscan  Status     CEFEPIME  Sensitive  <=1  Final     Method:  MIC     CEFTAZIDIME  Sensitive  <=1  Final     Method:  MIC     CIPROFLOXACIN  Sensitive  <=0.25  Final     Method:  MIC     GENTAMICIN  Sensitive  <=1  Final     Method:  MIC     IMIPENEM  Sensitive  2  Final     Method:  MIC     PIP/TAZO  Sensitive  <=4  Final     Method:  MIC     TOBRAMYCIN  Sensitive  <=1  Final     He was changed to Cefepime 2 grams IV q 12 hours which I then changed to 2 grams IV q 8 hours and then changed to chronic oral cipro bid.  He had reported dramatic improvement in his LBP now at 4-5/10 more so with exertion. He states ESR are now normal   He is seeing a Psychologist, sport and exercise at [facility] for a 2nd opinion re his back.   Is also undergone another hernia surgery at [facility] in March.  His back pain worse in the hernia surgery in March  And we simply continued him on levaquin thru July visit and to present day more than TWO YEARS from his original surgery.  Last ESR was 18 and CRP  0.6  Back pain is improved but still present and he is being managed by pain management specialist with the Neurosurgery group. He is anxious to trial off abx to see if this DOES NOT recur and would make him eligible for Neurosurgery at [facility].     Review of Systems  Constitutional: Negative for chills, diaphoresis, activity change, appetite change, fatigue and unexpected weight change.  HENT: Negative for congestion, rhinorrhea, sinus pressure, sneezing, sore throat and trouble swallowing.   Eyes: Negative for photophobia and visual disturbance.  Respiratory: Negative for cough, chest tightness, shortness of breath, wheezing and stridor.   Cardiovascular: Negative for palpitations and leg swelling.  Gastrointestinal: Negative for nausea, vomiting, diarrhea, blood in stool, abdominal distention and anal bleeding.  Genitourinary: Negative for hematuria, flank pain and difficulty urinating.  Musculoskeletal: Positive for back pain. Negative for myalgias, joint swelling, arthralgias and gait problem.  Skin: Negative for color change, pallor, rash and wound.  Neurological: Negative for dizziness, tremors and light-headedness.  Hematological: Negative for adenopathy. Does not  bruise/bleed easily.  Psychiatric/Behavioral: Negative for behavioral problems, confusion, sleep disturbance, dysphoric mood, decreased concentration and agitation.       Objective:   Physical Exam  Constitutional: He is oriented to person, place, and time. He appears well-developed and well-nourished. No distress.  HENT:  Head: Normocephalic and atraumatic.  Mouth/Throat: Oropharynx is clear and moist. No oropharyngeal exudate.  Eyes: Conjunctivae and EOM are normal. No scleral icterus.  Neck: Normal range of motion. Neck supple. No JVD present.  Cardiovascular: Normal rate and regular rhythm.   Pulmonary/Chest: No respiratory distress. He has no wheezes.  Abdominal: He exhibits no distension. There is no tenderness.   Musculoskeletal: He exhibits no edema or tenderness.  Neurological: He is alert and oriented to person, place, and time. He exhibits normal muscle tone.  Skin: Skin is warm and dry. He is not diaphoretic. No erythema. No pallor.  Psychiatric: He has a normal mood and affect. His behavior is normal. Judgment and thought content normal.       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   Now we are TWO YEARS from original surgery I am ok to recheck esr and crp and if reassuring to let him trial off levaquin for 2 months and reassess him    I spent greater than 25 minutes with the patient including greater than 50% of time in face to face counsel of the patient and in coordination of their care.

## 2015-01-22 ENCOUNTER — Telehealth: Payer: Self-pay | Admitting: *Deleted

## 2015-01-22 NOTE — Progress Notes (Signed)
Patient notified

## 2015-01-22 NOTE — Telephone Encounter (Signed)
Patient notified Kevin [name]  

## 2015-01-22 NOTE — Telephone Encounter (Signed)
-----   Message from [name], MD sent at 01/22/2015  8:18 AM EST ----- I am going to be out of the office today but could someone call [name] and tell him he can stop his levaquin

## 2015-03-23 ENCOUNTER — Ambulatory Visit: Payer: Medicare Other | Admitting: Infectious Disease

## 2015-03-24 ENCOUNTER — Ambulatory Visit (INDEPENDENT_AMBULATORY_CARE_PROVIDER_SITE_OTHER): Payer: Medicare Other | Admitting: Infectious Disease

## 2015-03-24 ENCOUNTER — Encounter: Payer: Self-pay | Admitting: Infectious Disease

## 2015-03-24 VITALS — BP 124/74 | HR 76 | Temp 98.0°F | Wt 245.0 lb

## 2015-03-24 DIAGNOSIS — M4620 Osteomyelitis of vertebra, site unspecified: Secondary | ICD-10-CM | POA: Diagnosis not present

## 2015-03-24 DIAGNOSIS — A498 Other bacterial infections of unspecified site: Secondary | ICD-10-CM | POA: Diagnosis not present

## 2015-03-24 LAB — BASIC METABOLIC PANEL WITH GFR
BUN: 12 mg/dL (ref 6–23)
CO2: 25 mEq/L (ref 19–32)
Calcium: 9.1 mg/dL (ref 8.4–10.5)
Chloride: 101 mEq/L (ref 96–112)
Creat: 0.88 mg/dL (ref 0.50–1.35)
GFR, Est African American: >89 mL/min
GFR, Est Non African American: 88 mL/min
Glucose, Bld: 164 mg/dL — ABNORMAL HIGH (ref 70–99)
Potassium: 4.2 mEq/L (ref 3.5–5.3)
Sodium: 137 mEq/L (ref 135–145)

## 2015-03-24 LAB — C-REACTIVE PROTEIN: CRP: <0.5 mg/dL (ref <0.60)

## 2015-03-24 NOTE — Progress Notes (Signed)
Subjective:    Patient ID: Kevin [name], male    DOB: 1946/12/19, 69 y.o.   MRN: [id number]  HPI 69 yo Mw with hx DM2 (since 1997) and spinal stenosis. On 08-30-12 under going decompression, screws, cage and autograft L3-5. He did well post-op and was able to be d/c home on 09-04-12. He returnedon 12-28-12 with back pain and confusion. In ED he was found to have lower abd/supra-pubic pain, temp 102.9. He was found to have an UCx growing Enterococcus (sens Amp, Vanco). He was started on ceftriaxone. He was also found to have a soft tissue mass on his lower lumbar area. He underwent CT scan of the abdomen on 12/28/2012 showing abnormal soft tissue prevertebral area as well as indistinct endplates at Y5-0 with lucency around the pedicle screw at L5. He underwent surgery by Dr. Christella Noa with POSTERIOR LUMBAR FUSION 1 WITH HARDWARE REMOVAL L5 pedicle screw. Bone cultures grew   PSEUDOMONAS AERUGINOSA       Antibiotic  Sensitivity  Microscan  Status     CEFEPIME  Sensitive  <=1  Final     Method:  MIC     CEFTAZIDIME  Sensitive  <=1  Final     Method:  MIC     CIPROFLOXACIN  Sensitive  <=0.25  Final     Method:  MIC     GENTAMICIN  Sensitive  <=1  Final     Method:  MIC     IMIPENEM  Sensitive  2  Final     Method:  MIC     PIP/TAZO  Sensitive  <=4  Final     Method:  MIC     TOBRAMYCIN  Sensitive  <=1  Final     He was changed to Cefepime 2 grams IV q 12 hours which I then changed to 2 grams IV q 8 hours and then changed to chronic oral cipro bid.  He had reported dramatic improvement in his LBP now at 4-5/10 more so with exertion. He states ESR are now normal   He is seeing a Psychologist, sport and exercise at [facility] for a 2nd opinion re his back.   Is also undergone another hernia surgery at [facility] in March.  His back pain worse in the hernia surgery in March And we simply continued him on levaquin thru July visit and to present day  more than TWO YEARS from his original surgery.  Last ESR was 18 and CRP 0.6  Back pain is improved but still present and he is being managed by pain management specialist with the Neurosurgery group.  He stopped Levaquin in January 2016. He denies any worsening back pain, fevers or chills. He has not had any redraw on his CRP or sed rate. His last sed rate in Jan/2016 was 10.            Review of Systems Constitutional: Negative for chills, diaphoresis, activity change, appetite change, fatigue and unexpected weight change.  HENT: Negative for congestion, rhinorrhea, sinus pressure, sneezing, sore throat and trouble swallowing.  Eyes: Negative for photophobia and visual disturbance.  Respiratory: Negative for cough, chest tightness, shortness of breath, wheezing and stridor.  Cardiovascular: Negative for palpitations and leg swelling.  Gastrointestinal: Negative for nausea, vomiting, diarrhea, blood in stool, abdominal distention and anal bleeding.  Genitourinary: Negative for hematuria, flank pain and difficulty urinating.  Musculoskeletal: Positive for back pain. Negative for myalgias, joint swelling, arthralgias and gait problem.  Skin: Negative for color change, pallor, rash and wound.  Neurological: Negative for dizziness, tremors and light-headedness.  Hematological: Negative for adenopathy. Does not bruise/bleed easily.  Psychiatric/Behavioral: Negative for behavioral problems, confusion, sleep disturbance, dysphoric mood, decreased concentration and agitation.    Objective:   Physical Exam Constitutional: He is oriented to person, place, and time. He appears well-developed and well-nourished. No distress.  HENT:  Head: Normocephalic and atraumatic.  Mouth/Throat: Oropharynx is clear and moist. No oropharyngeal exudate.  Eyes: Conjunctivae and EOM are normal. No scleral icterus.  Neck: Normal range of motion. Neck supple. No JVD present.  Cardiovascular: Normal rate and  regular rhythm.  Pulmonary/Chest: No respiratory distress. He has no wheezes.  Abdominal: He exhibits no distension. There is no tenderness.  Musculoskeletal: He exhibits no edema or tenderness.  Neurological: He is alert and oriented to person, place, and time. He exhibits normal muscle tone.  Skin: Skin is warm and dry. He is not diaphoretic. No erythema. No pallor.  Psychiatric: He has a normal mood and affect. His behavior is normal. Judgment and thought content normal.        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 He states he does not feel any different since stopping the Levaquin.  Will redraw sed rate, CRP, CBC and CMP today to eval for s/s of infection. Will follow up in four months to reeval for s/s of infection. At this point, Kevin [name] states he would like to look into pursuing surgical revision through [facility]. His back surgeon would like clearance with ID in order to proceed with the surgery.

## 2015-03-24 NOTE — Progress Notes (Signed)
   Subjective:    Patient ID: Kevin [name], male    DOB: 1946/04/09, 69 y.o.   MRN: [id number]  HPI  For details please see NP Student Lauren O'Neil's note above.  Kevin [name] is with stable back pain since I last saw him.   He is without fevers, chills since stopping his levaquin. His ESR and CRp were normal when last checked 2 months ago.   Review of Systems  Constitutional: Negative for fever, chills, diaphoresis, activity change, appetite change, fatigue and unexpected weight change.  HENT: Negative for congestion, rhinorrhea, sinus pressure, sneezing, sore throat and trouble swallowing.   Eyes: Negative for photophobia and visual disturbance.  Respiratory: Negative for cough, chest tightness, shortness of breath, wheezing and stridor.   Cardiovascular: Negative for chest pain, palpitations and leg swelling.  Gastrointestinal: Negative for nausea, vomiting, abdominal pain, diarrhea, constipation, blood in stool, abdominal distention and anal bleeding.  Genitourinary: Negative for dysuria, hematuria, flank pain and difficulty urinating.  Musculoskeletal: Negative for myalgias, back pain, joint swelling, arthralgias and gait problem.  Skin: Negative for color change, pallor, rash and wound.  Neurological: Negative for dizziness, tremors, weakness and light-headedness.  Hematological: Negative for adenopathy. Does not bruise/bleed easily.  Psychiatric/Behavioral: Negative for behavioral problems, confusion, sleep disturbance, dysphoric mood, decreased concentration and agitation.       Objective:   Physical Exam  Constitutional: He is oriented to person, place, and time. He appears well-developed and well-nourished.  HENT:  Head: Normocephalic and atraumatic.  Eyes: Conjunctivae and EOM are normal.  Neck: Normal range of motion. Neck supple.  Cardiovascular: Normal rate and regular rhythm.   Pulmonary/Chest: Effort normal. No respiratory distress. He has no wheezes.  Abdominal:  Soft. He exhibits no distension.  Musculoskeletal: Normal range of motion. He exhibits no edema or tenderness.  Neurological: He is alert and oriented to person, place, and time.  Skin: Skin is warm and dry. No rash noted. No erythema. No pallor.  Psychiatric: He has a normal mood and affect. His behavior is normal. Judgment and thought content normal.          Assessment & Plan:   Hardware associated Lumbar vertebral osteomyelitis, diskitis due to Pseudomonas:  He has had an extensive and protracted course of antimicrobial therapy and now a 2 month "holiday" from antibiotics  We will recheck his ESR, CRP today as well. Provided they are reassuring will have him stay off abx and rtc to see [place] in 4 months. Though I have told him there is no way I can GUARANTEE safety for surgery from an ID standpoint and that the infection is cured, the further we see him off antibiotics and doing well witout worsening symptoms and normal inflammatory markers to more evidence that we my have cured this infection and the greater hope in safety for Neurosurgery that would not be complicated by this particular infection

## 2015-03-25 LAB — SEDIMENTATION RATE: Sed Rate: 4 mm/hr (ref 0–20)

## 2015-06-21 ENCOUNTER — Other Ambulatory Visit: Payer: Self-pay

## 2015-07-15 ENCOUNTER — Encounter: Payer: Self-pay | Admitting: Infectious Disease

## 2015-07-15 ENCOUNTER — Ambulatory Visit (INDEPENDENT_AMBULATORY_CARE_PROVIDER_SITE_OTHER): Payer: Medicare Other | Admitting: Infectious Disease

## 2015-07-15 VITALS — BP 106/67 | HR 79 | Temp 97.6°F | Ht 73.0 in | Wt 254.0 lb

## 2015-07-15 DIAGNOSIS — M4620 Osteomyelitis of vertebra, site unspecified: Secondary | ICD-10-CM | POA: Diagnosis not present

## 2015-07-15 DIAGNOSIS — A498 Other bacterial infections of unspecified site: Secondary | ICD-10-CM | POA: Diagnosis not present

## 2015-07-15 DIAGNOSIS — M4647 Discitis, unspecified, lumbosacral region: Secondary | ICD-10-CM

## 2015-07-15 LAB — C-REACTIVE PROTEIN: CRP: <0.5 mg/dL (ref <0.60)

## 2015-07-15 NOTE — Progress Notes (Signed)
   Subjective:    Patient ID: Kevin [name], male    DOB: [birth year], 69 y.o.   MRN: [id number]  HPI  .  [name] is with stable back pain since I last saw him.   He is without fevers, chills since stopping his levaquin. His ESR and CRp were normal when last checked.   Review of Systems  Constitutional: Negative for fever, chills, diaphoresis, activity change, appetite change, fatigue and unexpected weight change.  HENT: Negative for congestion, rhinorrhea, sinus pressure, sneezing, sore throat and trouble swallowing.   Eyes: Negative for photophobia and visual disturbance.  Respiratory: Negative for cough, chest tightness, shortness of breath, wheezing and stridor.   Cardiovascular: Negative for chest pain, palpitations and leg swelling.  Gastrointestinal: Negative for nausea, vomiting, abdominal pain, diarrhea, constipation, blood in stool, abdominal distention and anal bleeding.  Genitourinary: Negative for dysuria, hematuria, flank pain and difficulty urinating.  Musculoskeletal: Negative for myalgias, back pain, joint swelling, arthralgias and gait problem.  Skin: Negative for color change, pallor, rash and wound.  Neurological: Negative for dizziness, tremors, weakness and light-headedness.  Hematological: Negative for adenopathy. Does not bruise/bleed easily.  Psychiatric/Behavioral: Negative for behavioral problems, confusion, sleep disturbance, dysphoric mood, decreased concentration and agitation.       Objective:   Physical Exam  Constitutional: He is oriented to person, place, and time. He appears well-developed and well-nourished.  HENT:  Head: Normocephalic and atraumatic.  Eyes: Conjunctivae and EOM are normal.  Neck: Normal range of motion. Neck supple.  Cardiovascular: Normal rate and regular rhythm.   Pulmonary/Chest: Effort normal. No respiratory distress. He has no wheezes.  Abdominal: Soft. He exhibits no distension.  Musculoskeletal: Normal range of  motion. He exhibits no edema or tenderness.  Neurological: He is alert and oriented to person, place, and time.  Skin: Skin is warm and dry. No rash noted. No erythema. No pallor.  Psychiatric: He has a normal mood and affect. His behavior is normal. Judgment and thought content normal.          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 [name] seems to have cured this infection.

## 2015-07-16 LAB — SEDIMENTATION RATE: Sed Rate: 7 mm/hr (ref 0–20)

## 2015-07-22 ENCOUNTER — Ambulatory Visit: Payer: Medicare Other | Admitting: Infectious Disease

## 2015-08-27 ENCOUNTER — Other Ambulatory Visit: Payer: Self-pay | Admitting: Neurosurgery

## 2015-08-27 DIAGNOSIS — M4646 Discitis, unspecified, lumbar region: Secondary | ICD-10-CM

## 2015-09-14 ENCOUNTER — Inpatient Hospital Stay: Admission: RE | Admit: 2015-09-14 | Payer: Medicare Other | Source: Ambulatory Visit

## 2015-09-16 ENCOUNTER — Ambulatory Visit
Admission: RE | Admit: 2015-09-16 | Discharge: 2015-09-16 | Disposition: A | Discharge status: Home / Self Care | Payer: Medicare Other | Source: Ambulatory Visit | Attending: Neurosurgery | Admitting: Neurosurgery

## 2015-09-16 DIAGNOSIS — M4646 Discitis, unspecified, lumbar region: Secondary | ICD-10-CM

## 2015-11-24 ENCOUNTER — Telehealth: Payer: Self-pay | Admitting: *Deleted

## 2015-11-24 NOTE — Telephone Encounter (Signed)
Being seen by Podiatrist at [facility], Dr Cira Servant.  Dr. Sheffield Slider wanting to know whether the patient's infection is cured prior to treating patient's Podiatry issue.  Checking last office note and labs.

## 2015-11-24 NOTE — Telephone Encounter (Signed)
I thought we had given him the green light to even have neurosurgery let alone podiatry

## 2015-11-29 NOTE — Telephone Encounter (Signed)
Patient returned call from [facility] and wanted to know what it is about. Advised that Dr Sheffield Slider office wanted medical clearance for foot surgery.

## 2015-12-09 ENCOUNTER — Emergency Department (HOSPITAL_COMMUNITY)
Admission: EM | Admit: 2015-12-09 | Discharge: 2015-12-09 | Disposition: A | Discharge status: Home / Self Care | Payer: Medicare Other | Attending: Emergency Medicine | Admitting: Emergency Medicine

## 2015-12-09 ENCOUNTER — Encounter (HOSPITAL_COMMUNITY): Payer: Self-pay | Admitting: Emergency Medicine

## 2015-12-09 DIAGNOSIS — I10 Essential (primary) hypertension: Secondary | ICD-10-CM | POA: Insufficient documentation

## 2015-12-09 DIAGNOSIS — Z7984 Long term (current) use of oral hypoglycemic drugs: Secondary | ICD-10-CM | POA: Diagnosis not present

## 2015-12-09 DIAGNOSIS — Z87891 Personal history of nicotine dependence: Secondary | ICD-10-CM | POA: Insufficient documentation

## 2015-12-09 DIAGNOSIS — E119 Type 2 diabetes mellitus without complications: Secondary | ICD-10-CM | POA: Insufficient documentation

## 2015-12-09 DIAGNOSIS — Z9889 Other specified postprocedural states: Secondary | ICD-10-CM | POA: Diagnosis not present

## 2015-12-09 DIAGNOSIS — M1990 Unspecified osteoarthritis, unspecified site: Secondary | ICD-10-CM | POA: Diagnosis not present

## 2015-12-09 DIAGNOSIS — N390 Urinary tract infection, site not specified: Secondary | ICD-10-CM | POA: Insufficient documentation

## 2015-12-09 DIAGNOSIS — R079 Chest pain, unspecified: Secondary | ICD-10-CM | POA: Diagnosis not present

## 2015-12-09 DIAGNOSIS — Z7982 Long term (current) use of aspirin: Secondary | ICD-10-CM | POA: Diagnosis not present

## 2015-12-09 DIAGNOSIS — Z8669 Personal history of other diseases of the nervous system and sense organs: Secondary | ICD-10-CM | POA: Diagnosis not present

## 2015-12-09 DIAGNOSIS — K219 Gastro-esophageal reflux disease without esophagitis: Secondary | ICD-10-CM | POA: Insufficient documentation

## 2015-12-09 DIAGNOSIS — Z79899 Other long term (current) drug therapy: Secondary | ICD-10-CM | POA: Insufficient documentation

## 2015-12-09 DIAGNOSIS — K449 Diaphragmatic hernia without obstruction or gangrene: Secondary | ICD-10-CM | POA: Insufficient documentation

## 2015-12-09 DIAGNOSIS — R1012 Left upper quadrant pain: Secondary | ICD-10-CM

## 2015-12-09 DIAGNOSIS — Z794 Long term (current) use of insulin: Secondary | ICD-10-CM | POA: Insufficient documentation

## 2015-12-09 DIAGNOSIS — R1011 Right upper quadrant pain: Secondary | ICD-10-CM

## 2015-12-09 DIAGNOSIS — R1010 Upper abdominal pain, unspecified: Secondary | ICD-10-CM | POA: Diagnosis present

## 2015-12-09 LAB — CBC WITH DIFFERENTIAL/PLATELET
Basophils Absolute: 0.1 10*3/uL (ref 0.0–0.1)
Basophils Relative: 1 %
Eosinophils Absolute: 0.3 10*3/uL (ref 0.0–0.7)
Eosinophils Relative: 5 %
HCT: 39 % (ref 39.0–52.0)
Hemoglobin: 12.5 g/dL — ABNORMAL LOW (ref 13.0–17.0)
Lymphocytes Relative: 34 %
Lymphs Abs: 1.9 10*3/uL (ref 0.7–4.0)
MCH: 27.8 pg (ref 26.0–34.0)
MCHC: 32.1 g/dL (ref 30.0–36.0)
MCV: 86.7 fL (ref 78.0–100.0)
Monocytes Absolute: 0.5 10*3/uL (ref 0.1–1.0)
Monocytes Relative: 8 %
Neutro Abs: 2.9 10*3/uL (ref 1.7–7.7)
Neutrophils Relative %: 52 %
Platelets: 207 10*3/uL (ref 150–400)
RBC: 4.5 MIL/uL (ref 4.22–5.81)
RDW: 14.2 % (ref 11.5–15.5)
WBC: 5.6 10*3/uL (ref 4.0–10.5)

## 2015-12-09 LAB — COMPREHENSIVE METABOLIC PANEL
ALT: 24 U/L (ref 17–63)
AST: 31 U/L (ref 15–41)
Albumin: 3.7 g/dL (ref 3.5–5.0)
Alkaline Phosphatase: 67 U/L (ref 38–126)
Anion gap: 10 (ref 5–15)
BUN: 14 mg/dL (ref 6–20)
CO2: 24 mmol/L (ref 22–32)
Calcium: 9.3 mg/dL (ref 8.9–10.3)
Chloride: 106 mmol/L (ref 101–111)
Creatinine, Ser: 0.97 mg/dL (ref 0.61–1.24)
GFR calc Af Amer: >60 mL/min (ref >60)
GFR calc non Af Amer: >60 mL/min (ref >60)
Glucose, Bld: 114 mg/dL — ABNORMAL HIGH (ref 65–99)
Potassium: 4.1 mmol/L (ref 3.5–5.1)
Sodium: 140 mmol/L (ref 135–145)
Total Bilirubin: 0.7 mg/dL (ref 0.3–1.2)
Total Protein: 7.7 g/dL (ref 6.5–8.1)

## 2015-12-09 LAB — URINALYSIS, ROUTINE W REFLEX MICROSCOPIC
Bilirubin Urine: NEGATIVE
Glucose, UA: 100 mg/dL — AB
Ketones, ur: NEGATIVE mg/dL
Nitrite: NEGATIVE
Protein, ur: NEGATIVE mg/dL
Specific Gravity, Urine: 1.019 (ref 1.005–1.030)
pH: 6.5 (ref 5.0–8.0)

## 2015-12-09 LAB — URINE MICROSCOPIC-ADD ON

## 2015-12-09 LAB — TROPONIN I
Troponin I: <0.03 ng/mL (ref <0.031)
Troponin I: <0.03 ng/mL (ref <0.031)

## 2015-12-09 LAB — LIPASE, BLOOD: Lipase: 19 U/L (ref 11–51)

## 2015-12-09 MED ORDER — SODIUM CHLORIDE 0.9 % IV SOLN
INTRAVENOUS | Status: DC
  Administered 2015-12-09: 06:00:00 via INTRAVENOUS

## 2015-12-09 NOTE — ED Notes (Signed)
Pt states he was sound asleep and woke up diaphoretic with pain to his upper abdomen/lower chest area  Denies nausea, vomiting or diarrhea  Pt states the pain has subsided at this time  Pt states he ate leftovers for dinner, pork chops and spinach souffle

## 2015-12-09 NOTE — Discharge Instructions (Signed)
Avoid fried, spicy or greasy foods for the next several weeks. Consider seeing a gastroenterologist about your abdominal pain and stomach problems you have been having. Look at the GERD diet instructions.    Gastroesophageal Reflux Disease, Adult Normally, food travels down the esophagus and stays in the stomach to be digested. However, when a person has gastroesophageal reflux disease (GERD), food and stomach acid move back up into the esophagus. When this happens, the esophagus becomes sore and inflamed. Over time, GERD can create small holes (ulcers) in the lining of the esophagus.  CAUSES This condition is caused by a problem with the muscle between the esophagus and the stomach (lower esophageal sphincter, or LES). Normally, the LES muscle closes after food passes through the esophagus to the stomach. When the LES is weakened or abnormal, it does not close properly, and that allows food and stomach acid to go back up into the esophagus. The LES can be weakened by certain dietary substances, medicines, and medical conditions, including:  Tobacco use.  Pregnancy.  Having a hiatal hernia.  Heavy alcohol use.  Certain foods and beverages, such as coffee, chocolate, onions, and peppermint. RISK FACTORS This condition is more likely to develop in:  People who have an increased body weight.  People who have connective tissue disorders.  People who use NSAID medicines. SYMPTOMS Symptoms of this condition include:  Heartburn.  Difficult or painful swallowing.  The feeling of having a lump in the throat.  Abitter taste in the mouth.  Bad breath.  Having a large amount of saliva.  Having an upset or bloated stomach.  Belching.  Chest pain.  Shortness of breath or wheezing.  Ongoing (chronic) cough or a night-time cough.  Wearing away of tooth enamel.  Weight loss. Different conditions can cause chest pain. Make sure to see your health care provider if you experience  chest pain. DIAGNOSIS Your health care provider will take a medical history and perform a physical exam. To determine if you have mild or severe GERD, your health care provider may also monitor how you respond to treatment. You may also have other tests, including:  An endoscopy toexamine your stomach and esophagus with a small camera.  A test thatmeasures the acidity level in your esophagus.  A test thatmeasures how much pressure is on your esophagus.  A barium swallow or modified barium swallow to show the shape, size, and functioning of your esophagus. TREATMENT The goal of treatment is to help relieve your symptoms and to prevent complications. Treatment for this condition may vary depending on how severe your symptoms are. Your health care provider may recommend:  Changes to your diet.  Medicine.  Surgery. HOME CARE INSTRUCTIONS Diet  Follow a diet as recommended by your health care provider. This may involve avoiding foods and drinks such as:  Coffee and tea (with or without caffeine).  Drinks that containalcohol.  Energy drinks and sports drinks.  Carbonated drinks or sodas.  Chocolate and cocoa.  Peppermint and mint flavorings.  Garlic and onions.  Horseradish.  Spicy and acidic foods, including peppers, chili powder, curry powder, vinegar, hot sauces, and barbecue sauce.  Citrus fruit juices and citrus fruits, such as oranges, lemons, and limes.  Tomato-based foods, such as red sauce, chili, salsa, and pizza with red sauce.  Fried and fatty foods, such as donuts, french fries, potato chips, and high-fat dressings.  High-fat meats, such as hot dogs and fatty cuts of red and white meats, such as rib eye  steak, sausage, ham, and bacon.  High-fat dairy items, such as whole milk, butter, and cream cheese.  Eat small, frequent meals instead of large meals.  Avoid drinking large amounts of liquid with your meals.  Avoid eating meals during the 2-3 hours  before bedtime.  Avoid lying down right after you eat.  Do not exercise right after you eat. General Instructions  Pay attention to any changes in your symptoms.  Take over-the-counter and prescription medicines only as told by your health care provider. Do not take aspirin, ibuprofen, or other NSAIDs unless your health care provider told you to do so.  Do not use any tobacco products, including cigarettes, chewing tobacco, and e-cigarettes. If you need help quitting, ask your health care provider.  Wear loose-fitting clothing. Do not wear anything tight around your waist that causes pressure on your abdomen.  Raise (elevate) the head of your bed 6 inches (15cm).  Try to reduce your stress, such as with yoga or meditation. If you need help reducing stress, ask your health care provider.  If you are overweight, reduce your weight to an amount that is healthy for you. Ask your health care provider for guidance about a safe weight loss goal.  Keep all follow-up visits as told by your health care provider. This is important. SEEK MEDICAL CARE IF:  You have new symptoms.  You have unexplained weight loss.  You have difficulty swallowing, or it hurts to swallow.  You have wheezing or a persistent cough.  Your symptoms do not improve with treatment.  You have a hoarse voice. SEEK IMMEDIATE MEDICAL CARE IF:  You have pain in your arms, neck, jaw, teeth, or back.  You feel sweaty, dizzy, or light-headed.  You have chest pain or shortness of breath.  You vomit and your vomit looks like blood or coffee grounds.  You faint.  Your stool is bloody or black.  You cannot swallow, drink, or eat.   This information is not intended to replace advice given to you by your health care provider. Make sure you discuss any questions you have with your health care provider.   Document Released: 09/20/2005 Document Revised: 09/01/2015 Document Reviewed: 04/07/2015 Elsevier Interactive  Patient Education 2016 Elsevier Inc[employer]    Hiatal Hernia A hiatal hernia occurs when part of your stomach slides above the muscle that separates your abdomen from your chest (diaphragm). You can be born with a hiatal hernia (congenital), or it may develop over time. In almost all cases of hiatal hernia, only the top part of the stomach pushes through.  Many people have a hiatal hernia with no symptoms. The larger the hernia, the more likely that you will have symptoms. In some cases, a hiatal hernia allows stomach acid to flow back into the tube that carries food from your mouth to your stomach (esophagus). This may cause heartburn symptoms. Severe heartburn symptoms may mean you have developed a condition called gastroesophageal reflux disease (GERD).  CAUSES  Hiatal hernias are caused by a weakness in the opening (hiatus) where your esophagus passes through your diaphragm to attach to the upper part of your stomach. You may be born with a weakness in your hiatus, or a weakness can develop. RISK FACTORS Older age is a major risk factor for a hiatal hernia. Anything that increases pressure on your diaphragm can also increase your risk of a hiatal hernia. This includes:  Pregnancy.  Excess weight.  Frequent constipation. SIGNS AND SYMPTOMS  People with a hiatal  hernia often have no symptoms. If symptoms develop, they are almost always caused by GERD. They may include:  Heartburn.  Belching.  Indigestion.  Trouble swallowing.  Coughing or wheezing.  Sore throat.  Hoarseness.  Chest pain. DIAGNOSIS  A hiatal hernia is sometimes found during an exam for another problem. Your health care provider may suspect a hiatal hernia if you have symptoms of GERD. Tests may be done to diagnose GERD. These may include:  X-rays of your stomach or chest.  An upper gastrointestinal (GI) series. This is an X-ray exam of your GI tract involving the use of a chalky liquid that you swallow. The liquid  shows up clearly on the X-ray.  Endoscopy. This is a procedure to look into your stomach using a thin, flexible tube that has a tiny camera and light on the end of it. TREATMENT  If you have no symptoms, you may not need treatment. If you have symptoms, treatment may include:  Dietary and lifestyle changes to help reduce GERD symptoms.  Medicines. These may include:  Over-the-counter antacids.  Medicines that make your stomach empty more quickly.  Medicines that block the production of stomach acid (H2 blockers).  Stronger medicines to reduce stomach acid (proton pump inhibitors).  You may need surgery to repair the hernia if other treatments are not helping. HOME CARE INSTRUCTIONS   Take all medicines as directed by your health care provider.  Quit smoking, if you smoke.  Try to achieve and maintain a healthy body weight.  Eat frequent small meals instead of three large meals a day. This keeps your stomach from getting too full.  Eat slowly.  Do not lie down right after eating.  Do noteat 1-2 hours before bed.   Do not drink beverages with caffeine. These include cola, coffee, cocoa, and tea.  Do not drink alcohol.  Avoid foods that can make symptoms of GERD worse. These may include:  Fatty foods.  Citrus fruits.  Other foods and drinks that contain acid.  Avoid putting pressure on your belly. Anything that puts pressure on your belly increases the amount of acid that may be pushed up into your esophagus.   Avoid bending over, especially after eating.  Raise the head of your bed by putting blocks under the legs. This keeps your head and esophagus higher than your stomach.  Do not wear tight clothing around your chest or stomach.  Try not to strain when having a bowel movement, when urinating, or when lifting heavy objects. SEEK MEDICAL CARE IF:  Your symptoms are not controlled with medicines or lifestyle changes.  You are having trouble  swallowing.  You have coughing or wheezing that will not go away. SEEK IMMEDIATE MEDICAL CARE IF:  Your pain is getting worse.  Your pain spreads to your arms, neck, jaw, teeth, or back.  You have shortness of breath.  You sweat for no reason.  You feel sick to your stomach (nauseous) or vomit.  You vomit blood.  You have bright red blood in your stools.  You have black, tarry stools.    This information is not intended to replace advice given to you by your health care provider. Make sure you discuss any questions you have with your health care provider.   Document Released: 03/02/2004 Document Revised: 01/01/2015 Document Reviewed: 11/28/2013 Elsevier Interactive Patient Education [employer].

## 2015-12-09 NOTE — ED Provider Notes (Addendum)
CSN: [id number]     Arrival date & time 12/09/15  [id number] History   First MD Initiated Contact with Patient 12/09/15 [id number]     Chief Complaint  Patient presents with  . Abdominal Pain     (Consider location/radiation/quality/duration/timing/severity/associated sxs/prior Treatment) HPI patient states last night for dinner they ate leftover pork chops with spinach souffle and potatoes. About 4:50 AM he was awakened from sleep with a dull aching pain in his lower chest/upper abdomen. The pain did not radiate. He states he's had something similar before for an upset stomach however he did not feel like he had an upset stomach. He denies nausea, vomiting, or diaphoresis. He states he was sweating. He denies having any difficulty with foods lately however his wife points out to him that lately he has been complaining having some discomfort after he eats. Patient does have a history of a hiatal hernia and is on omeprazole. He states currently his pain is gone.  PCP Dr Electa Sniff  Past Medical History  Diagnosis Date  . Hypertension   . Diabetes mellitus   . GERD (gastroesophageal reflux disease)   . H/O hiatal hernia   . Arthritis   . Sleep apnea     . Last test was before 2007 , not sure name of test site.   Past Surgical History  Procedure Laterality Date  . Colonoscopy  2011  . Hiatal hernia repair      x2  . Eye surgery      Cataract 2013  . Knee arthroscopy      bil  . Patella fracture surgery    . Back surgery     Family History  Problem Relation Age of Onset  . Coronary artery disease     Social History  Substance Use Topics  . Smoking status: Former Smoker -- 16 years    Quit date: 09/04/1980  . Smokeless tobacco: Never Used  . Alcohol Use: No     Comment: rarely  live with spouse  Review of Systems  All other systems reviewed and are negative.     Allergies  Review of patient's allergies indicates no known allergies.  Home Medications   Prior to Admission  medications   Medication Sig Start Date End Date Taking? Authorizing Provider  ACAI PO Take 1 tablet by mouth daily.   Yes Historical Provider, MD  amLODipine (NORVASC) 10 MG tablet Take 5 mg by mouth daily.    Yes Historical Provider, MD  aspirin 325 MG tablet Take 325 mg by mouth daily.   Yes Historical Provider, MD  atorvastatin (LIPITOR) 80 MG tablet Take 80 mg by mouth daily.   Yes Historical Provider, MD  furosemide (LASIX) 40 MG tablet Take 40 mg by mouth daily.   Yes Historical Provider, MD  ibuprofen (ADVIL,MOTRIN) 200 MG tablet Take 200 mg by mouth every 6 (six) hours as needed for moderate pain.   Yes Historical Provider, MD  insulin aspart (NOVOLOG FLEXPEN) 100 UNIT/ML FlexPen Inject 20-25 Units into the skin 3 (three) times daily with meals.    Yes Historical Provider, MD  insulin lispro (HUMALOG) 100 UNIT/ML injection Inject 50 Units into the skin at bedtime.   Yes Historical Provider, MD  losartan (COZAAR) 100 MG tablet Take 100 mg by mouth daily.   Yes Historical Provider, MD  metFORMIN (GLUCOPHAGE) 1000 MG tablet Take 1,000 mg by mouth 2 (two) times daily with a meal.    Yes Historical Provider, MD  Multiple Vitamin (MULTIVITAMIN  WITH MINERALS) TABS Take 1 tablet by mouth daily.   Yes Historical Provider, MD  naproxen sodium (ANAPROX) 220 MG tablet Take 440 mg by mouth 2 (two) times daily with a meal.   Yes Historical Provider, MD  Nutritional Supplements (PYCNOGENOL) 154-25 MG CAPS Take 1 capsule by mouth daily.   Yes Historical Provider, MD  omeprazole (PRILOSEC) 40 MG capsule Take 40 mg by mouth daily.   Yes Historical Provider, MD  oxyCODONE-acetaminophen (PERCOCET) 10-325 MG tablet Take 0.5-1 tablets by mouth every 4 (four) hours as needed for pain.   Yes Historical Provider, MD  potassium chloride SA (K-DUR,KLOR-CON) 20 MEQ tablet Take 20 mEq by mouth 2 (two) times daily.   Yes Historical Provider, MD  RESVERATROL 100 MG CAPS Take 1 capsule by mouth daily.   Yes Historical  Provider, MD  tadalafil (CIALIS) 5 MG tablet Take 5 mg by mouth daily as needed for erectile dysfunction.   Yes Historical Provider, MD  levofloxacin (LEVAQUIN) 750 MG tablet Take 1 tablet (750 mg total) by mouth daily. Patient not taking: Reported on 03/24/2015 01/26/14   [name], MD  senna-docusate (SENOKOT-S) 8.6-50 MG per tablet Take 2 tablets by mouth at bedtime. Patient not taking: Reported on 12/09/2015 01/04/13   Lavone Orn, MD   BP 148/83 mmHg  Pulse 75  Temp(Src) 97.9 F (36.6 C) (Oral)  Resp 18  Ht 6\' 1"  (1.854 m)  Wt 250 lb (113.399 kg)  BMI 32.99 kg/m2  SpO2 98%  Vital signs normal   Physical Exam  Constitutional: He is oriented to person, place, and time. He appears well-developed and well-nourished.  Non-toxic appearance. He does not appear ill. No distress.  HENT:  Head: Normocephalic and atraumatic.  Right Ear: External ear normal.  Left Ear: External ear normal.  Nose: Nose normal. No mucosal edema or rhinorrhea.  Mouth/Throat: Oropharynx is clear and moist and mucous membranes are normal. No dental abscesses or uvula swelling.  Eyes: Conjunctivae and EOM are normal. Pupils are equal, round, and reactive to light.  Neck: Normal range of motion and full passive range of motion without pain. Neck supple.  Cardiovascular: Normal rate, regular rhythm and normal heart sounds.  Exam reveals no gallop and no friction rub.   No murmur heard. Pulmonary/Chest: Effort normal and breath sounds normal. No respiratory distress. He has no wheezes. He has no rhonchi. He has no rales. He exhibits no tenderness and no crepitus.  Abdominal: Soft. Normal appearance and bowel sounds are normal. He exhibits no distension. There is no tenderness. There is no rebound and no guarding.    Area of pain is noted, nontender now  Musculoskeletal: Normal range of motion. He exhibits no edema or tenderness.  Moves all extremities well.   Neurological: He is alert and oriented to  person, place, and time. He has normal strength. No cranial nerve deficit.  Skin: Skin is warm, dry and intact. No rash noted. No erythema. No pallor.  Psychiatric: He has a normal mood and affect. His speech is normal and behavior is normal. His mood appears not anxious.  Nursing note and vitals reviewed.   ED Course  Procedures (including critical care time)  Medications  0.9 %  sodium chloride infusion ( Intravenous New Bag/Given 12/09/15 0614)    Pt was pain free during my exam. We discussed evaluating him for heart disease, gall bladder disease and pancreas disease.   7:15 AM patient was given his test results. We discussed the need  to get a second troponin done 2 hours after his first troponin. He remains pain-free. His wife again points out he's been complaining of some stomach problems recently. His pain tonight is most likely from his hiatal hernia. He was left at change of shift with Dr Lita Mains to get his delta troponin and hopefully discharge home. We discussed follow-up with gastroenterology which she's never seen before. He was given the number for equal gastroenterology or his PCPs office can arrange a follow-up. He states he has an appointment with his PCP on December 27. Pt has hx of UTI sepsis, urine culture was sent.   Labs Review Results for orders placed or performed during the hospital encounter of 12/09/15  Comprehensive metabolic panel  Result Value Ref Range   Sodium 140 135 - 145 mmol/L   Potassium 4.1 3.5 - 5.1 mmol/L   Chloride 106 101 - 111 mmol/L   CO2 24 22 - 32 mmol/L   Glucose, Bld 114 (H) 65 - 99 mg/dL   BUN 14 6 - 20 mg/dL   Creatinine, Ser 0.97 0.61 - 1.24 mg/dL   Calcium 9.3 8.9 - 10.3 mg/dL   Total Protein 7.7 6.5 - 8.1 g/dL   Albumin 3.7 3.5 - 5.0 g/dL   AST 31 15 - 41 U/L   ALT 24 17 - 63 U/L   Alkaline Phosphatase 67 38 - 126 U/L   Total Bilirubin 0.7 0.3 - 1.2 mg/dL   GFR calc non Af Amer >60 >60 mL/min   GFR calc Af Amer >60 >60 mL/min     Anion gap 10 5 - 15  Lipase, blood  Result Value Ref Range   Lipase 19 11 - 51 U/L  Troponin I  Result Value Ref Range   Troponin I <0.03 <0.031 ng/mL  CBC with Differential  Result Value Ref Range   WBC 5.6 4.0 - 10.5 K/uL   RBC 4.50 4.22 - 5.81 MIL/uL   Hemoglobin 12.5 (L) 13.0 - 17.0 g/dL   HCT 39.0 39.0 - 52.0 %   MCV 86.7 78.0 - 100.0 fL   MCH 27.8 26.0 - 34.0 pg   MCHC 32.1 30.0 - 36.0 g/dL   RDW 14.2 11.5 - 15.5 %   Platelets 207 150 - 400 K/uL   Neutrophils Relative % 52 %   Neutro Abs 2.9 1.7 - 7.7 K/uL   Lymphocytes Relative 34 %   Lymphs Abs 1.9 0.7 - 4.0 K/uL   Monocytes Relative 8 %   Monocytes Absolute 0.5 0.1 - 1.0 K/uL   Eosinophils Relative 5 %   Eosinophils Absolute 0.3 0.0 - 0.7 K/uL   Basophils Relative 1 %   Basophils Absolute 0.1 0.0 - 0.1 K/uL  Urinalysis, Routine w reflex microscopic  Result Value Ref Range   Color, Urine YELLOW YELLOW   APPearance CLEAR CLEAR   Specific Gravity, Urine 1.019 1.005 - 1.030   pH 6.5 5.0 - 8.0   Glucose, UA 100 (A) NEGATIVE mg/dL   Hgb urine dipstick TRACE (A) NEGATIVE   Bilirubin Urine NEGATIVE NEGATIVE   Ketones, ur NEGATIVE NEGATIVE mg/dL   Protein, ur NEGATIVE NEGATIVE mg/dL   Nitrite NEGATIVE NEGATIVE   Leukocytes, UA TRACE (A) NEGATIVE  Urine microscopic-add on  Result Value Ref Range   Squamous Epithelial / LPF 0-5 (A) NONE SEEN   WBC, UA 0-5 0 - 5 WBC/hpf   RBC / HPF 0-5 0 - 5 RBC/hpf   Bacteria, UA RARE (A) NONE SEEN  Laboratory interpretation all normal except mild anemia (states he has iron deficiency anemia and takes women's vitamins for the iron).      Imaging Review No results found. I have personally reviewed and evaluated these images and lab results as part of my medical decision-making.   EKG Interpretation   Date/Time:  Thursday December 09 2015 05:30:25 EST Ventricular Rate:  75 PR Interval:  184 QRS Duration: 106 QT Interval:  401 QTC Calculation: 448 R Axis:   1 Text  Interpretation:  Sinus rhythm Borderline T wave abnormalities No  significant change since last tracing 29 Aug 2012 Confirmed by [facility]   MD-I, Trentin Knappenberger ([id number]) on 12/09/2015 5:33:43 AM      MDM   Final diagnoses:  Abdominal pain, bilateral upper quadrant  Chest pain at rest  Hiatal hernia  Urinary tract infection without hematuria, site unspecified    Disposition pending   Rolland Porter, MD, Barbette Or, MD 12/09/15 [place], MD 12/09/15 [phone]

## 2015-12-11 LAB — URINE CULTURE
Culture: 70000
Special Requests: NORMAL

## 2015-12-13 ENCOUNTER — Telehealth (HOSPITAL_BASED_OUTPATIENT_CLINIC_OR_DEPARTMENT_OTHER): Payer: Self-pay | Admitting: Emergency Medicine

## 2015-12-13 NOTE — Telephone Encounter (Signed)
Post ED Visit - Positive Culture Follow-up  Culture report reviewed by antimicrobial stewardship pharmacist:  []  Elenor Quinones, Pharm.D. [x]  Heide Guile, Pharm.D., BCPS []  Parks Neptune, Pharm.D. []  Alycia Rossetti, Pharm.D., BCPS []  [place], [place].D., BCPS, AAHIVP []  Legrand Como, Pharm.D., BCPS, AAHIVP []  Milus Glazier, Pharm.D. []  Stephens November, Pharm.D.  Positive urine culture Treated with none, asymptomatic,  and no further patient follow-up is required at this time.  Hazle Nordmann 12/13/2015, 12:17 PM

## 2015-12-21 ENCOUNTER — Ambulatory Visit
Admission: RE | Admit: 2015-12-21 | Discharge: 2015-12-21 | Disposition: A | Discharge status: Home / Self Care | Payer: Medicare Other | Source: Ambulatory Visit | Attending: Internal Medicine | Admitting: Internal Medicine

## 2015-12-21 ENCOUNTER — Other Ambulatory Visit: Payer: Self-pay | Admitting: Internal Medicine

## 2015-12-21 DIAGNOSIS — R1011 Right upper quadrant pain: Secondary | ICD-10-CM

## 2015-12-21 DIAGNOSIS — R509 Fever, unspecified: Secondary | ICD-10-CM

## 2015-12-22 ENCOUNTER — Other Ambulatory Visit (HOSPITAL_COMMUNITY): Payer: Self-pay | Admitting: Internal Medicine

## 2015-12-22 DIAGNOSIS — R1011 Right upper quadrant pain: Secondary | ICD-10-CM

## 2016-01-07 ENCOUNTER — Telehealth: Payer: Self-pay | Admitting: *Deleted

## 2016-01-07 NOTE — Telephone Encounter (Signed)
Dr. Laurann Montana would like to speak with Dr. Tommy Medal.  Dr. Tommy Medal please call [phone].

## 2016-01-10 ENCOUNTER — Encounter (HOSPITAL_COMMUNITY): Payer: Medicare Other

## 2016-01-10 NOTE — Telephone Encounter (Signed)
Patients ESR is back up to 70 after febrile illness in January. LFTS were up as well at that time.'   I think he should be seen back here in February to reassess how he is doing and recheck ESR, CRP

## 2016-01-12 NOTE — Telephone Encounter (Signed)
Will send Dr. Lucianne Lei Dam's message to Dr. Sandra Cockayne.

## 2016-01-18 ENCOUNTER — Ambulatory Visit: Payer: Medicare Other | Admitting: Infectious Disease

## 2016-01-19 ENCOUNTER — Encounter: Payer: Self-pay | Admitting: Infectious Disease

## 2016-01-19 ENCOUNTER — Ambulatory Visit (INDEPENDENT_AMBULATORY_CARE_PROVIDER_SITE_OTHER): Payer: Medicare Other | Admitting: Infectious Disease

## 2016-01-19 VITALS — BP 130/72 | HR 87 | Temp 98.1°F | Wt 257.0 lb

## 2016-01-19 DIAGNOSIS — A498 Other bacterial infections of unspecified site: Secondary | ICD-10-CM

## 2016-01-19 DIAGNOSIS — N410 Acute prostatitis: Secondary | ICD-10-CM | POA: Diagnosis not present

## 2016-01-19 DIAGNOSIS — M4620 Osteomyelitis of vertebra, site unspecified: Secondary | ICD-10-CM | POA: Diagnosis not present

## 2016-01-19 DIAGNOSIS — N419 Inflammatory disease of prostate, unspecified: Secondary | ICD-10-CM | POA: Insufficient documentation

## 2016-01-19 DIAGNOSIS — R509 Fever, unspecified: Secondary | ICD-10-CM | POA: Diagnosis not present

## 2016-01-19 HISTORY — DX: Inflammatory disease of prostate, unspecified: N41.9

## 2016-01-19 HISTORY — DX: Fever, unspecified: R50.9

## 2016-01-19 LAB — CBC WITH DIFFERENTIAL/PLATELET
Basophils Absolute: 0.1 10*3/uL (ref 0.0–0.1)
Basophils Relative: 1 % (ref 0–1)
Eosinophils Absolute: 0.3 10*3/uL (ref 0.0–0.7)
Eosinophils Relative: 6 % — ABNORMAL HIGH (ref 0–5)
HCT: 39.8 % (ref 39.0–52.0)
Hemoglobin: 12.7 g/dL — ABNORMAL LOW (ref 13.0–17.0)
Lymphocytes Relative: 30 % (ref 12–46)
Lymphs Abs: 1.6 10*3/uL (ref 0.7–4.0)
MCH: 26.8 pg (ref 26.0–34.0)
MCHC: 31.9 g/dL (ref 30.0–36.0)
MCV: 84.1 fL (ref 78.0–100.0)
MPV: 10 fL (ref 8.6–12.4)
Monocytes Absolute: 0.4 10*3/uL (ref 0.1–1.0)
Monocytes Relative: 7 % (ref 3–12)
Neutro Abs: 3 10*3/uL (ref 1.7–7.7)
Neutrophils Relative %: 56 % (ref 43–77)
Platelets: 214 10*3/uL (ref 150–400)
RBC: 4.73 MIL/uL (ref 4.22–5.81)
RDW: 15 % (ref 11.5–15.5)
WBC: 5.4 10*3/uL (ref 4.0–10.5)

## 2016-01-19 LAB — COMPLETE METABOLIC PANEL WITH GFR
ALT: 15 U/L (ref 9–46)
AST: 13 U/L (ref 10–35)
Albumin: 3.6 g/dL (ref 3.6–5.1)
Alkaline Phosphatase: 65 U/L (ref 40–115)
BUN: 14 mg/dL (ref 7–25)
CO2: 26 mmol/L (ref 20–31)
Calcium: 9.3 mg/dL (ref 8.6–10.3)
Chloride: 101 mmol/L (ref 98–110)
Creat: 0.84 mg/dL (ref 0.70–1.18)
GFR, Est African American: >89 mL/min (ref >=60)
GFR, Est Non African American: 89 mL/min (ref >=60)
Glucose, Bld: 144 mg/dL — ABNORMAL HIGH (ref 65–99)
Potassium: 4.2 mmol/L (ref 3.5–5.3)
Sodium: 137 mmol/L (ref 135–146)
Total Bilirubin: 0.7 mg/dL (ref 0.2–1.2)
Total Protein: 7.4 g/dL (ref 6.1–8.1)

## 2016-01-19 LAB — C-REACTIVE PROTEIN: CRP: 0.5 mg/dL (ref <0.60)

## 2016-01-19 NOTE — Progress Notes (Signed)
Chief complaint: lower back pain  Subjective:    Patient ID: Kevin [name], male    DOB: [birth year], 70 y.o.   MRN: [id number]  HPI  . 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  Since then he had done well until shortly before Xmas with fevers, chills flu like symptoms. He was seen by Dr. Laurann Montana his PCP shortly thereafter and also had abdominal pain. He had UA and culture that was apparently positive and he was placed on levaquin for possible prostatitis. At the time he was seen his ESR and CRP had risen dramatically with the former being int he 74s. It was my understanding that his back pain was also worse. He completed 2 weeks of levaquin and has now stopped it for 2 weeks. He has not had return of fevers, chills or significant abdominal pain. He tells me his back pain is stable at the "new normal" for him.  Past Medical History  Diagnosis Date  . Hypertension   . Diabetes mellitus   . GERD (gastroesophageal reflux disease)   . H/O hiatal hernia   . Arthritis   . Sleep apnea     . Last test was before 2007 , not sure name of test site.    Past Surgical History  Procedure Laterality Date  . Colonoscopy  2011  . Hiatal hernia repair      x2  . Eye surgery      Cataract 2013  . Knee arthroscopy      bil  . Patella fracture surgery    . Back surgery      Family History  Problem Relation Age of Onset  . Coronary artery disease        Social History   Social History  . Marital Status: Married    Spouse Name: N/A  . Number of Children: N/A  . Years of Education: N/A   Social History Main Topics  . Smoking status: Former Smoker -- 16 years    Quit date: 09/04/1980  . Smokeless tobacco: Never Used  . Alcohol Use: No     Comment: rarely  . Drug Use: No  . Sexual Activity: Not  Asked   Other Topics Concern  . None   Social History Narrative    No Known Allergies   Current outpatient prescriptions:  .  ACAI PO, Take 1 tablet by mouth daily., Disp: , Rfl:  .  amLODipine (NORVASC) 10 MG tablet, Take 5 mg by mouth daily. , Disp: , Rfl:  .  aspirin 325 MG tablet, Take 325 mg by mouth daily., Disp: , Rfl:  .  atorvastatin (LIPITOR) 80 MG tablet, Take 80 mg by mouth daily., Disp: , Rfl:  .  furosemide (LASIX) 40 MG tablet, Take 40 mg by mouth daily., Disp: , Rfl:  .  ibuprofen (ADVIL,MOTRIN) 200 MG tablet, Take 200 mg by mouth every 6 (six) hours as needed for moderate pain., Disp: , Rfl:  .  insulin aspart (NOVOLOG FLEXPEN) 100 UNIT/ML FlexPen, Inject 20-25 Units into the skin 3 (three) times daily with meals. , Disp: , Rfl:  .  insulin lispro (HUMALOG) 100 UNIT/ML injection, Inject 50 Units into the skin at bedtime., Disp: , Rfl:  .  losartan (COZAAR) 100 MG tablet, Take 100 mg by mouth daily., Disp: ,  Rfl:  .  metFORMIN (GLUCOPHAGE) 1000 MG tablet, Take 1,000 mg by mouth 2 (two) times daily with a meal. , Disp: , Rfl:  .  Multiple Vitamin (MULTIVITAMIN WITH MINERALS) TABS, Take 1 tablet by mouth daily., Disp: , Rfl:  .  naproxen sodium (ANAPROX) 220 MG tablet, Take 440 mg by mouth 2 (two) times daily with a meal., Disp: , Rfl:  .  Nutritional Supplements (PYCNOGENOL) 154-25 MG CAPS, Take 1 capsule by mouth daily., Disp: , Rfl:  .  omeprazole (PRILOSEC) 40 MG capsule, Take 40 mg by mouth daily., Disp: , Rfl:  .  potassium chloride SA (K-DUR,KLOR-CON) 20 MEQ tablet, Take 20 mEq by mouth 2 (two) times daily., Disp: , Rfl:  .  RESVERATROL 100 MG CAPS, Take 1 capsule by mouth daily., Disp: , Rfl:  .  tadalafil (CIALIS) 5 MG tablet, Take 5 mg by mouth daily as needed for erectile dysfunction., Disp: , Rfl:  .  levofloxacin (LEVAQUIN) 750 MG tablet, Take 1 tablet (750 mg total) by mouth daily. (Patient not taking: Reported on 01/19/2016), Disp: 90 tablet, Rfl:  3   Review of Systems  Constitutional: Negative for fever, chills, diaphoresis, activity change, appetite change, fatigue and unexpected weight change.  HENT: Negative for congestion, rhinorrhea, sinus pressure, sneezing, sore throat and trouble swallowing.   Eyes: Negative for photophobia and visual disturbance.  Respiratory: Negative for cough, chest tightness, shortness of breath, wheezing and stridor.   Cardiovascular: Negative for chest pain, palpitations and leg swelling.  Gastrointestinal: Negative for nausea, vomiting, abdominal pain, diarrhea, constipation, blood in stool, abdominal distention and anal bleeding.  Genitourinary: Negative for dysuria, hematuria, flank pain and difficulty urinating.  Musculoskeletal: Negative for myalgias, back pain, joint swelling, arthralgias and gait problem.  Skin: Negative for color change, pallor, rash and wound.  Neurological: Negative for dizziness, tremors, weakness and light-headedness.  Hematological: Negative for adenopathy. Does not bruise/bleed easily.  Psychiatric/Behavioral: Negative for behavioral problems, confusion, sleep disturbance, dysphoric mood, decreased concentration and agitation.       Objective:   Physical Exam  Constitutional: He is oriented to person, place, and time. He appears well-developed and well-nourished.  HENT:  Head: Normocephalic and atraumatic.  Eyes: Conjunctivae and EOM are normal.  Neck: Normal range of motion. Neck supple.  Cardiovascular: Normal rate and regular rhythm.   Pulmonary/Chest: Effort normal. No respiratory distress. He has no wheezes.  Abdominal: Soft. He exhibits no distension.  Musculoskeletal: Normal range of motion. He exhibits no edema or tenderness.  Neurological: He is alert and oriented to person, place, and time.  Skin: Skin is warm and dry. No rash noted. No erythema. No pallor.  Psychiatric: He has a normal mood and affect. His behavior is normal. Judgment and thought content  normal.          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 IF ESR and CRP up then we will get MRI with contrast of spine --would advise AGAINST injecting corticosteroids into his spine and against lumbar surgery at this point until this acute issue can be sorted out  Prostatitis: no evidence of recurrence yet after only 2 weeks  I spent greater than 25 minutes with the patient including greater than 50% of time in face to face counsel of the patient's lumbar infection, FUO, possible prostatitisand in coordination of their care.

## 2016-01-20 LAB — SEDIMENTATION RATE: Sed Rate: 6 mm/hr (ref 0–20)

## 2016-01-21 ENCOUNTER — Telehealth: Payer: Self-pay | Admitting: *Deleted

## 2016-01-21 NOTE — Progress Notes (Signed)
Ok he can followup with [place] again any time there are problems

## 2016-01-21 NOTE — Progress Notes (Signed)
Patient notified and future appointment cancelled.

## 2016-01-21 NOTE — Telephone Encounter (Signed)
Patient notified and follow up appt cancelled.

## 2016-01-21 NOTE — Telephone Encounter (Signed)
-----  Message from [name], MD sent at 01/21/2016  8:30 AM EST ----- [name] ESR and CRP were completely normal he does not need an MRI or further workup at this time. This isi good news

## 2016-01-28 ENCOUNTER — Encounter: Payer: Self-pay | Admitting: Infectious Disease

## 2016-02-03 NOTE — Telephone Encounter (Signed)
Dr. Lucianne Lei Dam's reply:  Hello there!         The labs you had were very reassuring. Can we get more specifics from Dr. Valentina Shaggy office with re to where the steroids will be injected? I am always wanting to be cautious with this type of intervention esp steroid injections into the spine. I have had several pts infections "blossom " after such therapies and the temporary increase in your inflammatory markers in December had me worried        Kevin [name] to patient and Kevin [name] is planning on injecting his L spine at 3, 4, and 5. Dr. Valentina Shaggy # [phone]. Please advise.

## 2016-02-16 ENCOUNTER — Telehealth: Payer: Self-pay | Admitting: *Deleted

## 2016-02-16 NOTE — Telephone Encounter (Signed)
Patient called regarding Dr. Lucianne Lei Dam's advice that he not proceed with the injections to his back. He was scheduled for this on 02/23/16 with Dr. Gregary Cromer and she said she would hold that appt until she sees Dr. Lucianne Lei Dam's recommendation. Last office note and labs faxed to [phone]. Patient states he wants to follow Dr. Lucianne Lei Dam's suggestion.

## 2016-02-22 ENCOUNTER — Encounter: Payer: Self-pay | Admitting: Infectious Disease

## 2016-03-13 ENCOUNTER — Ambulatory Visit: Payer: Medicare Other | Admitting: Infectious Disease

## 2016-03-14 ENCOUNTER — Encounter: Payer: Self-pay | Admitting: Infectious Disease

## 2016-03-14 ENCOUNTER — Ambulatory Visit (INDEPENDENT_AMBULATORY_CARE_PROVIDER_SITE_OTHER): Payer: Medicare Other | Admitting: Infectious Disease

## 2016-03-14 VITALS — BP 118/78 | HR 88 | Temp 98.4°F | Ht 72.0 in | Wt 255.0 lb

## 2016-03-14 DIAGNOSIS — A498 Other bacterial infections of unspecified site: Secondary | ICD-10-CM

## 2016-03-14 DIAGNOSIS — M4620 Osteomyelitis of vertebra, site unspecified: Secondary | ICD-10-CM | POA: Diagnosis not present

## 2016-03-14 DIAGNOSIS — R509 Fever, unspecified: Secondary | ICD-10-CM | POA: Diagnosis not present

## 2016-03-14 DIAGNOSIS — N410 Acute prostatitis: Secondary | ICD-10-CM | POA: Diagnosis not present

## 2016-03-14 LAB — CBC WITH DIFFERENTIAL/PLATELET
Basophils Absolute: 0.1 10*3/uL (ref 0.0–0.1)
Basophils Relative: 1 % (ref 0–1)
Eosinophils Absolute: 0.2 10*3/uL (ref 0.0–0.7)
Eosinophils Relative: 3 % (ref 0–5)
HCT: 39.1 % (ref 39.0–52.0)
Hemoglobin: 12.7 g/dL — ABNORMAL LOW (ref 13.0–17.0)
Lymphocytes Relative: 33 % (ref 12–46)
Lymphs Abs: 1.9 10*3/uL (ref 0.7–4.0)
MCH: 27.4 pg (ref 26.0–34.0)
MCHC: 32.5 g/dL (ref 30.0–36.0)
MCV: 84.3 fL (ref 78.0–100.0)
MPV: 9.5 fL (ref 8.6–12.4)
Monocytes Absolute: 0.3 10*3/uL (ref 0.1–1.0)
Monocytes Relative: 6 % (ref 3–12)
Neutro Abs: 3.3 10*3/uL (ref 1.7–7.7)
Neutrophils Relative %: 57 % (ref 43–77)
Platelets: 220 10*3/uL (ref 150–400)
RBC: 4.64 MIL/uL (ref 4.22–5.81)
RDW: 15.1 % (ref 11.5–15.5)
WBC: 5.8 10*3/uL (ref 4.0–10.5)

## 2016-03-14 NOTE — Progress Notes (Signed)
Chief complaint: lower back pain followup for diskitis  Subjective:    Patient ID: Kevin [name], male    DOB: 19-Jul-1946, 70 y.o.   MRN: [id number]  HPI  . 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  Since then he had done well until shortly before Xmas with fevers, chills flu like symptoms. He was seen by Dr. Laurann Montana his PCP shortly thereafter and also had abdominal pain. He had UA and culture that was apparently positive and he was placed on levaquin for possible prostatitis. At the time he was seen his ESR and CRP had risen dramatically with the former being int he 70s. It was my understanding that his back pain was also worse. He completed 2 weeks of levaquin and has now stopped it for 2 weeks prior to his last visit with me.  . He has not had return of fevers, chills or significant abdominal pain. He tells me his back pain is stable at the "new normal" for him.  He has remained off abx since then. He has also been seeing Dr. Gregary Cromer at [facility] re possible corticosteroid injection into his back given that the Neurosurgeon and the patient have come to the conclusion that proceeding with further surgery would not be the best plan for him.  I was reluctant to give the "go ahead for corticosteroid injection" given his episode in December. It is now nearly 3 months later and his back pain has not worsened.   He would like to get the "OK" from me for Dr. Sheffield Slider to give him a corticosteroid injection.  After extensive discussion with [name] (which we have also had via MyChart on Epic) I again emphasized that I would NOT be able to guarantee that he would not have an Infectious Complication from corticosteroid injection. I underlined specific risks for him including re-emergence of an  apparently pseudomonas infection vs introduction of a new infection. However I told him that I would be supportive of him trying a corticosteroid shot IF his ESR and CRP  Were normal and his back pain had remained stable. I emphasized however that he would need to accept that there was a risk and that no one would ever be able to tell him or anyone that there is no risk to this procedure or any procedure. He is very much interested in giving the steroid injection a "shot."      Past Medical History  Diagnosis Date  . Hypertension   . Diabetes mellitus   . GERD (gastroesophageal reflux disease)   . H/O hiatal hernia   . Arthritis   . Sleep apnea     . Last test was before 2007 , not sure name of test site.  . FUO (fever of unknown origin) 01/19/2016  . Prostatitis 01/19/2016    Past Surgical History  Procedure Laterality Date  . Colonoscopy  2011  . Hiatal hernia repair      x2  . Eye surgery      Cataract 2013  . Knee arthroscopy      bil  . Patella fracture surgery    . Back surgery      Family History  Problem Relation Age of Onset  . Coronary artery disease        Social History  Social History  . Marital Status: Married    Spouse Name: N/A  . Number of Children: N/A  . Years of Education: N/A   Social History Main Topics  . Smoking status: Former Smoker -- 16 years    Quit date: 09/04/1980  . Smokeless tobacco: Never Used  . Alcohol Use: No     Comment: rarely  . Drug Use: No  . Sexual Activity: Not Asked   Other Topics Concern  . None   Social History Narrative    No Known Allergies   Current outpatient prescriptions:  .  ACAI PO, Take 1 tablet by mouth daily., Disp: , Rfl:  .  amLODipine (NORVASC) 10 MG tablet, Take 5 mg by mouth daily. , Disp: , Rfl:  .  aspirin 325 MG tablet, Take 325 mg by mouth daily., Disp: , Rfl:  .  atorvastatin (LIPITOR) 80 MG tablet, Take 80 mg by mouth daily., Disp: , Rfl:  .  furosemide (LASIX) 40 MG tablet, Take 40  mg by mouth daily., Disp: , Rfl:  .  ibuprofen (ADVIL,MOTRIN) 200 MG tablet, Take 200 mg by mouth every 6 (six) hours as needed for moderate pain., Disp: , Rfl:  .  insulin aspart (NOVOLOG FLEXPEN) 100 UNIT/ML FlexPen, Inject 20-25 Units into the skin 3 (three) times daily with meals. , Disp: , Rfl:  .  insulin lispro (HUMALOG) 100 UNIT/ML injection, Inject 50 Units into the skin at bedtime., Disp: , Rfl:  .  losartan (COZAAR) 100 MG tablet, Take 100 mg by mouth daily., Disp: , Rfl:  .  metFORMIN (GLUCOPHAGE) 1000 MG tablet, Take 1,000 mg by mouth 2 (two) times daily with a meal. , Disp: , Rfl:  .  Multiple Vitamin (MULTIVITAMIN WITH MINERALS) TABS, Take 1 tablet by mouth daily., Disp: , Rfl:  .  naproxen sodium (ANAPROX) 220 MG tablet, Take 440 mg by mouth 2 (two) times daily with a meal., Disp: , Rfl:  .  Nutritional Supplements (PYCNOGENOL) 154-25 MG CAPS, Take 1 capsule by mouth daily., Disp: , Rfl:  .  omeprazole (PRILOSEC) 40 MG capsule, Take 40 mg by mouth daily., Disp: , Rfl:  .  potassium chloride SA (K-DUR,KLOR-CON) 20 MEQ tablet, Take 20 mEq by mouth 2 (two) times daily., Disp: , Rfl:  .  RESVERATROL 100 MG CAPS, Take 1 capsule by mouth daily., Disp: , Rfl:  .  tadalafil (CIALIS) 5 MG tablet, Take 5 mg by mouth daily as needed for erectile dysfunction., Disp: , Rfl:    Review of Systems  Constitutional: Negative for fever, chills, diaphoresis, activity change, appetite change, fatigue and unexpected weight change.  HENT: Negative for congestion, rhinorrhea, sinus pressure, sneezing, sore throat and trouble swallowing.   Eyes: Negative for photophobia and visual disturbance.  Respiratory: Negative for cough, chest tightness, shortness of breath, wheezing and stridor.   Cardiovascular: Negative for chest pain, palpitations and leg swelling.  Gastrointestinal: Negative for nausea, vomiting, abdominal pain, diarrhea, constipation, blood in stool, abdominal distention and anal bleeding.    Genitourinary: Negative for dysuria, hematuria, flank pain and difficulty urinating.  Musculoskeletal: Negative for myalgias, back pain, joint swelling, arthralgias and gait problem.  Skin: Negative for color change, pallor, rash and wound.  Neurological: Negative for dizziness, tremors, weakness and light-headedness.  Hematological: Negative for adenopathy. Does not bruise/bleed easily.  Psychiatric/Behavioral: Negative for behavioral problems, confusion, sleep disturbance, dysphoric mood, decreased concentration and agitation.       Objective:   Physical Exam  Constitutional: He  is oriented to person, place, and time. He appears well-developed and well-nourished.  HENT:  Head: Normocephalic and atraumatic.  Eyes: Conjunctivae and EOM are normal.  Neck: Normal range of motion. Neck supple.  Cardiovascular: Normal rate and regular rhythm.   Pulmonary/Chest: Effort normal. No respiratory distress. He has no wheezes.  Abdominal: Soft. He exhibits no distension.  Musculoskeletal: Normal range of motion. He exhibits no edema or tenderness.  Neurological: He is alert and oriented to person, place, and time.  Skin: Skin is warm and dry. No rash noted. No erythema. No pallor.  Psychiatric: He has a normal mood and affect. His behavior is normal. Judgment and thought content normal.          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  --IF these are all normal I would be supportive of him trying a corticosteroid injection provided he accepts the risks for re-emergence of infection or new infection even with local steroid injection in his spine. He is willing and agreeable so we will see what his labs look like  Prostatitis: no evidence of recurrence yet    I spent greater than 40  minutes with the patient including gr ater than 50% of time in face to face counsel of the patient's  lumbar infection, FUO, possible prostatitisand in coordination of their care.

## 2016-03-15 LAB — C-REACTIVE PROTEIN: CRP: 0.6 mg/dL — ABNORMAL HIGH (ref <0.60)

## 2016-03-15 LAB — BASIC METABOLIC PANEL WITH GFR
BUN: 15 mg/dL (ref 7–25)
CO2: 24 mmol/L (ref 20–31)
Calcium: 9.3 mg/dL (ref 8.6–10.3)
Chloride: 99 mmol/L (ref 98–110)
Creat: 0.98 mg/dL (ref 0.70–1.18)
GFR, Est African American: >89 mL/min (ref >=60)
GFR, Est Non African American: 78 mL/min (ref >=60)
Glucose, Bld: 175 mg/dL — ABNORMAL HIGH (ref 65–99)
Potassium: 4.5 mmol/L (ref 3.5–5.3)
Sodium: 138 mmol/L (ref 135–146)

## 2016-03-15 LAB — SEDIMENTATION RATE: Sed Rate: 12 mm/hr (ref 0–20)

## 2016-04-28 ENCOUNTER — Encounter (HOSPITAL_COMMUNITY): Payer: Self-pay | Admitting: Nurse Practitioner

## 2016-04-28 ENCOUNTER — Emergency Department (HOSPITAL_COMMUNITY): Payer: Medicare Other

## 2016-04-28 ENCOUNTER — Emergency Department (HOSPITAL_COMMUNITY)
Admission: EM | Admit: 2016-04-28 | Discharge: 2016-04-28 | Disposition: A | Discharge status: Home / Self Care | Payer: Medicare Other | Attending: Emergency Medicine | Admitting: Emergency Medicine

## 2016-04-28 ENCOUNTER — Telehealth: Payer: Self-pay | Admitting: Gastroenterology

## 2016-04-28 DIAGNOSIS — Z23 Encounter for immunization: Secondary | ICD-10-CM | POA: Diagnosis not present

## 2016-04-28 DIAGNOSIS — R945 Abnormal results of liver function studies: Secondary | ICD-10-CM

## 2016-04-28 DIAGNOSIS — E119 Type 2 diabetes mellitus without complications: Secondary | ICD-10-CM | POA: Diagnosis not present

## 2016-04-28 DIAGNOSIS — W0110XA Fall on same level from slipping, tripping and stumbling with subsequent striking against unspecified object, initial encounter: Secondary | ICD-10-CM | POA: Insufficient documentation

## 2016-04-28 DIAGNOSIS — I10 Essential (primary) hypertension: Secondary | ICD-10-CM | POA: Insufficient documentation

## 2016-04-28 DIAGNOSIS — Z87891 Personal history of nicotine dependence: Secondary | ICD-10-CM | POA: Insufficient documentation

## 2016-04-28 DIAGNOSIS — M1990 Unspecified osteoarthritis, unspecified site: Secondary | ICD-10-CM | POA: Insufficient documentation

## 2016-04-28 DIAGNOSIS — Z79899 Other long term (current) drug therapy: Secondary | ICD-10-CM | POA: Diagnosis not present

## 2016-04-28 DIAGNOSIS — Y999 Unspecified external cause status: Secondary | ICD-10-CM | POA: Insufficient documentation

## 2016-04-28 DIAGNOSIS — R7989 Other specified abnormal findings of blood chemistry: Secondary | ICD-10-CM

## 2016-04-28 DIAGNOSIS — Z79891 Long term (current) use of opiate analgesic: Secondary | ICD-10-CM | POA: Diagnosis not present

## 2016-04-28 DIAGNOSIS — S069X9A Unspecified intracranial injury with loss of consciousness of unspecified duration, initial encounter: Secondary | ICD-10-CM | POA: Insufficient documentation

## 2016-04-28 DIAGNOSIS — Z794 Long term (current) use of insulin: Secondary | ICD-10-CM | POA: Insufficient documentation

## 2016-04-28 DIAGNOSIS — S0990XA Unspecified injury of head, initial encounter: Secondary | ICD-10-CM

## 2016-04-28 DIAGNOSIS — Z791 Long term (current) use of non-steroidal anti-inflammatories (NSAID): Secondary | ICD-10-CM | POA: Diagnosis not present

## 2016-04-28 DIAGNOSIS — B179 Acute viral hepatitis, unspecified: Secondary | ICD-10-CM | POA: Insufficient documentation

## 2016-04-28 DIAGNOSIS — R17 Unspecified jaundice: Secondary | ICD-10-CM

## 2016-04-28 DIAGNOSIS — S0181XA Laceration without foreign body of other part of head, initial encounter: Secondary | ICD-10-CM | POA: Diagnosis present

## 2016-04-28 DIAGNOSIS — K219 Gastro-esophageal reflux disease without esophagitis: Secondary | ICD-10-CM | POA: Diagnosis not present

## 2016-04-28 DIAGNOSIS — Y939 Activity, unspecified: Secondary | ICD-10-CM | POA: Diagnosis not present

## 2016-04-28 DIAGNOSIS — R1110 Vomiting, unspecified: Secondary | ICD-10-CM

## 2016-04-28 DIAGNOSIS — Z7982 Long term (current) use of aspirin: Secondary | ICD-10-CM | POA: Diagnosis not present

## 2016-04-28 DIAGNOSIS — S0191XA Laceration without foreign body of unspecified part of head, initial encounter: Secondary | ICD-10-CM

## 2016-04-28 DIAGNOSIS — Z7984 Long term (current) use of oral hypoglycemic drugs: Secondary | ICD-10-CM | POA: Diagnosis not present

## 2016-04-28 DIAGNOSIS — Y929 Unspecified place or not applicable: Secondary | ICD-10-CM | POA: Diagnosis not present

## 2016-04-28 LAB — URINALYSIS, ROUTINE W REFLEX MICROSCOPIC
Glucose, UA: 100 mg/dL — AB
Ketones, ur: NEGATIVE mg/dL
Nitrite: POSITIVE — AB
Protein, ur: 100 mg/dL — AB
Specific Gravity, Urine: 1.026 (ref 1.005–1.030)
pH: 6 (ref 5.0–8.0)

## 2016-04-28 LAB — CK: Total CK: 1141 U/L — ABNORMAL HIGH (ref 49–397)

## 2016-04-28 LAB — CBC WITH DIFFERENTIAL/PLATELET
Basophils Absolute: 0 10*3/uL (ref 0.0–0.1)
Basophils Relative: 0 %
Eosinophils Absolute: 0 10*3/uL (ref 0.0–0.7)
Eosinophils Relative: 0 %
HCT: 38.6 % — ABNORMAL LOW (ref 39.0–52.0)
Hemoglobin: 12.5 g/dL — ABNORMAL LOW (ref 13.0–17.0)
Lymphocytes Relative: 9 %
Lymphs Abs: 1 10*3/uL (ref 0.7–4.0)
MCH: 27.2 pg (ref 26.0–34.0)
MCHC: 32.4 g/dL (ref 30.0–36.0)
MCV: 84.1 fL (ref 78.0–100.0)
Monocytes Absolute: 0.5 10*3/uL (ref 0.1–1.0)
Monocytes Relative: 5 %
Neutro Abs: 10.1 10*3/uL — ABNORMAL HIGH (ref 1.7–7.7)
Neutrophils Relative %: 86 %
Platelets: 203 10*3/uL (ref 150–400)
RBC: 4.59 MIL/uL (ref 4.22–5.81)
RDW: 15 % (ref 11.5–15.5)
WBC: 11.7 10*3/uL — ABNORMAL HIGH (ref 4.0–10.5)

## 2016-04-28 LAB — COMPREHENSIVE METABOLIC PANEL
ALT: 468 U/L — ABNORMAL HIGH (ref 17–63)
AST: 496 U/L — ABNORMAL HIGH (ref 15–41)
Albumin: 3.7 g/dL (ref 3.5–5.0)
Alkaline Phosphatase: 233 U/L — ABNORMAL HIGH (ref 38–126)
Anion gap: 12 (ref 5–15)
BUN: 19 mg/dL (ref 6–20)
CO2: 26 mmol/L (ref 22–32)
Calcium: 9.5 mg/dL (ref 8.9–10.3)
Chloride: 102 mmol/L (ref 101–111)
Creatinine, Ser: 1.17 mg/dL (ref 0.61–1.24)
GFR calc Af Amer: >60 mL/min (ref >60)
GFR calc non Af Amer: >60 mL/min (ref >60)
Glucose, Bld: 235 mg/dL — ABNORMAL HIGH (ref 65–99)
Potassium: 4.6 mmol/L (ref 3.5–5.1)
Sodium: 140 mmol/L (ref 135–145)
Total Bilirubin: 3.4 mg/dL — ABNORMAL HIGH (ref 0.3–1.2)
Total Protein: 8.1 g/dL (ref 6.5–8.1)

## 2016-04-28 LAB — URINE MICROSCOPIC-ADD ON

## 2016-04-28 MED ORDER — TETANUS-DIPHTH-ACELL PERTUSSIS 5-2.5-18.5 LF-MCG/0.5 IM SUSP
0.5000 mL | Freq: Once | INTRAMUSCULAR | Status: AC
  Administered 2016-04-28: 0.5 mL via INTRAMUSCULAR
  Filled 2016-04-28: qty 0.5

## 2016-04-28 MED ORDER — ATORVASTATIN CALCIUM 80 MG PO TABS
80.0000 mg | ORAL_TABLET | Freq: Once | ORAL | Status: AC
  Administered 2016-04-28: 80 mg via ORAL
  Filled 2016-04-28: qty 1

## 2016-04-28 MED ORDER — SODIUM CHLORIDE 0.9 % IV BOLUS (SEPSIS)
1000.0000 mL | Freq: Once | INTRAVENOUS | Status: AC
  Administered 2016-04-28: 1000 mL via INTRAVENOUS

## 2016-04-28 MED ORDER — LIDOCAINE-EPINEPHRINE 1 %-1:100000 IJ SOLN
10.0000 mL | Freq: Once | INTRAMUSCULAR | Status: DC
  Filled 2016-04-28: qty 1

## 2016-04-28 MED ORDER — OXYCODONE HCL 5 MG PO TABS
10.0000 mg | ORAL_TABLET | Freq: Once | ORAL | Status: AC
  Administered 2016-04-28: 10 mg via ORAL
  Filled 2016-04-28: qty 2

## 2016-04-28 NOTE — ED Notes (Signed)
Pt in Ct  

## 2016-04-28 NOTE — ED Notes (Signed)
Provider in room doing procedure

## 2016-04-28 NOTE — ED Notes (Signed)
Pt reportedly had a fall that led to pt loss of consciousness, facial laceration left eye brow. Denies being on anticoagulant.

## 2016-04-28 NOTE — Discharge Instructions (Signed)
YOUR LABWORK SHOWED ABNORMAL LIVER ENZYMES WHICH SUGGESTS HEPATITIS, OR INFLAMMATION OF THE LIVER. DR. Ardis Hughs WITH GASTROENTEROLOGY WILL FOLLOW YOU IN THE CLINIC FOR MORE STUDIES. IF YOU DEVELOP WORSENING NAUSEA, MALAISE, VOMITING, OR FEVER, PLEASE CONTACT THE CLINIC IMMEDIATELY. IF YOU HAVE ABDOMINAL PAIN, RETURN IMMEDIATELY TO THE ER.

## 2016-04-28 NOTE — ED Provider Notes (Signed)
History  By signing my name below, I, Marlowe Kays, attest that this documentation has been prepared under the direction and in the presence of Sharlett Iles, MD. Electronically Signed: Marlowe Kays, ED Scribe. 04/28/2016. 3:20 AM.  Chief Complaint  Patient presents with  . Head Laceration  . Fall   The history is provided by the patient and medical records. No language interpreter was used.    HPI Comments:  Kevin [name] is a 70 y.o. male who presents to the Emergency Department complaining of a fall that occurred a few hours ago. Pt states he was getting ready for bed, lost his balance and fell to the floor. He remembers hitting his head, causing a laceration to the left eyebrow and believes he had positive LOC. He reports associated bleeding from the wound that is controlled. Wife reports yesterday pt had an episode of emesis, abdominal pain and T max 99 degrees but he denies any current symptoms. He states he believes it was a coffee drink he had because he also had hives which have also resolved. He has not taken anything for symptoms. He denies modifying factors of his symptoms. He denies CP, SOB, numbness, tingling or weakness of any extremity, difficulty moving his extremities, neck pain, visual changes. He denies anticoagulant therapy. His last tetanus vaccination was approximately 5 years ago. No Recent international travel, Tylenol use, alcohol use, or IV drug use.  Past Medical History  Diagnosis Date  . Hypertension   . Diabetes mellitus   . GERD (gastroesophageal reflux disease)   . H/O hiatal hernia   . Arthritis   . Sleep apnea     . Last test was before 2007 , not sure name of test site.  . FUO (fever of unknown origin) 01/19/2016  . Prostatitis 01/19/2016   Past Surgical History  Procedure Laterality Date  . Colonoscopy  2011  . Hiatal hernia repair      x2  . Eye surgery      Cataract 2013  . Knee arthroscopy      bil  . Patella fracture surgery     . Back surgery     Family History  Problem Relation Age of Onset  . Coronary artery disease     Social History  Substance Use Topics  . Smoking status: Former Smoker -- 16 years    Quit date: 09/04/1980  . Smokeless tobacco: Never Used  . Alcohol Use: No     Comment: rarely    Review of Systems A complete 10 system review of systems was obtained and all systems are negative except as noted in the HPI and PMH.   Allergies  Review of patient's allergies indicates no known allergies.  Home Medications   Prior to Admission medications   Medication Sig Start Date End Date Taking? Authorizing Provider  ACAI PO Take 1 tablet by mouth 3 (three) times a week.    Yes Historical Provider, MD  amLODipine (NORVASC) 10 MG tablet Take 5 mg by mouth daily.    Yes Historical Provider, MD  aspirin 325 MG tablet Take 325 mg by mouth daily.   Yes Historical Provider, MD  atorvastatin (LIPITOR) 80 MG tablet Take 80 mg by mouth daily.   Yes Historical Provider, MD  furosemide (LASIX) 40 MG tablet Take 40 mg by mouth daily.   Yes Historical Provider, MD  ibuprofen (ADVIL,MOTRIN) 200 MG tablet Take 200 mg by mouth every 6 (six) hours as needed for moderate pain.  Yes Historical Provider, MD  insulin lispro (HUMALOG) 100 UNIT/ML injection Inject 50 Units into the skin daily.    Yes Historical Provider, MD  ketoconazole (NIZORAL) 2 % shampoo Apply 1 application topically 2 (two) times a week.   Yes Historical Provider, MD  losartan (COZAAR) 100 MG tablet Take 100 mg by mouth daily.   Yes Historical Provider, MD  metFORMIN (GLUCOPHAGE) 1000 MG tablet Take 1,000 mg by mouth 2 (two) times daily with a meal.    Yes Historical Provider, MD  Multiple Vitamin (MULTIVITAMIN WITH MINERALS) TABS Take 1 tablet by mouth daily.   Yes Historical Provider, MD  naproxen sodium (ANAPROX) 220 MG tablet Take 440 mg by mouth every 12 (twelve) hours as needed (pain).    Yes Historical Provider, MD  NOVOLIN 70/30 (70-30)  100 UNIT/ML injection Inject 20-25 Units into the skin 3 (three) times daily before meals. Per sliding scale 02/25/16  Yes Historical Provider, MD  Nutritional Supplements (PYCNOGENOL) 154-25 MG CAPS Take 1 capsule by mouth 3 (three) times a week.    Yes Historical Provider, MD  omeprazole (PRILOSEC) 40 MG capsule Take 40 mg by mouth daily.   Yes Historical Provider, MD  Oxycodone HCl 10 MG TABS Take 5-10 mg by mouth every 6 (six) hours as needed (pain).  04/27/16  Yes Historical Provider, MD  potassium chloride SA (K-DUR,KLOR-CON) 20 MEQ tablet Take 20 mEq by mouth 2 (two) times daily as needed (low potassium).    Yes Historical Provider, MD  RESVERATROL 100 MG CAPS Take 1 capsule by mouth 3 (three) times a week.    Yes Historical Provider, MD  tadalafil (CIALIS) 5 MG tablet Take 5 mg by mouth daily as needed for erectile dysfunction.   Yes Historical Provider, MD   Triage Vitals: BP 142/74 mmHg  Pulse 98  Temp(Src) 99.5 F (37.5 C) (Oral)  Resp 14  SpO2 93% Physical Exam  Constitutional: He is oriented to person, place, and time. He appears well-developed and well-nourished. No distress.  Awake, alert  HENT:  Head: Normocephalic.  3 cm linear laceration over left eyebrow with no active bleeding.  Eyes: Conjunctivae and EOM are normal. Pupils are equal, round, and reactive to light.  Neck: Neck supple.  Cardiovascular: Normal rate, regular rhythm and normal heart sounds.   No murmur heard. Pulmonary/Chest: Effort normal and breath sounds normal. No respiratory distress.  Abdominal: Soft. Bowel sounds are normal. He exhibits no distension.  Musculoskeletal: He exhibits no edema.  No midline c-spine tenderness.  Neurological: He is alert and oriented to person, place, and time. He has normal reflexes. No cranial nerve deficit. He exhibits normal muscle tone.  Fluent speech, normal finger-to-nose testing, negative pronator drift, no clonus 5/5 strength and normal sensation x all 4 extremities   Skin: Skin is warm and dry.  Psychiatric: He has a normal mood and affect. Judgment and thought content normal.  Nursing note and vitals reviewed.   ED Course  .Marland KitchenLaceration Repair Date/Time: 04/28/2016 6:47 AM Performed by: Sharlett Iles Authorized by: Sharlett Iles Consent: Verbal consent obtained. Consent given by: patient Patient identity confirmed: verbally with patient Body area: head/neck Location details: forehead Laceration length: 3 cm Foreign bodies: no foreign bodies Tendon involvement: none Nerve involvement: none Vascular damage: no Anesthesia: local infiltration Local anesthetic: lidocaine 1% with epinephrine Anesthetic total: 3 ml Preparation: Patient was prepped and draped in the usual sterile fashion. Irrigation solution: saline Irrigation method: syringe Amount of cleaning: standard Skin closure: 6-0  nylon Subcutaneous closure: 5-0 Vicryl (6-0 vicryl) Number of sutures: 9 Technique: simple Approximation: close Approximation difficulty: complex Dressing: antibiotic ointment Patient tolerance: Patient tolerated the procedure well with no immediate complications   (including critical care time) DIAGNOSTIC STUDIES: Oxygen Saturation is 93% on RA, low by my interpretation.   COORDINATION OF CARE: 3:03 AM- Will order labs, CT of head and suture wound. Pt verbalizes understanding and agrees to plan.   Medications  lidocaine-EPINEPHrine (XYLOCAINE W/EPI) 1 %-1:100000 (with pres) injection 10 mL (not administered)  sodium chloride 0.9 % bolus 1,000 mL (0 mLs Intravenous Stopped 04/28/16 0451)  Tdap (BOOSTRIX) injection 0.5 mL (0.5 mLs Intramuscular Given 04/28/16 0332)  oxyCODONE (Oxy IR/ROXICODONE) immediate release tablet 10 mg (10 mg Oral Given 04/28/16 0330)  atorvastatin (LIPITOR) tablet 80 mg (80 mg Oral Given 04/28/16 0355)  sodium chloride 0.9 % bolus 1,000 mL (0 mLs Intravenous Stopped 04/28/16 0630)    Labs Review Labs Reviewed   COMPREHENSIVE METABOLIC PANEL - Abnormal; Notable for the following:    Glucose, Bld 235 (*)    AST 496 (*)    ALT 468 (*)    Alkaline Phosphatase 233 (*)    Total Bilirubin 3.4 (*)    All other components within normal limits  CBC WITH DIFFERENTIAL/PLATELET - Abnormal; Notable for the following:    WBC 11.7 (*)    Hemoglobin 12.5 (*)    HCT 38.6 (*)    Neutro Abs 10.1 (*)    All other components within normal limits  URINALYSIS, ROUTINE W REFLEX MICROSCOPIC (NOT AT [facility]) - Abnormal; Notable for the following:    Color, Urine ORANGE (*)    APPearance CLOUDY (*)    Glucose, UA 100 (*)    Hgb urine dipstick MODERATE (*)    Bilirubin Urine MODERATE (*)    Protein, ur 100 (*)    Nitrite POSITIVE (*)    Leukocytes, UA SMALL (*)    All other components within normal limits  URINE MICROSCOPIC-ADD ON - Abnormal; Notable for the following:    Squamous Epithelial / LPF 6-30 (*)    Bacteria, UA FEW (*)    Casts GRANULAR CAST (*)    All other components within normal limits  CK - Abnormal; Notable for the following:    Total CK 1141 (*)    All other components within normal limits  URINE CULTURE    Imaging Review Ct Head Wo Contrast  04/28/2016  CLINICAL DATA:  Lost balance getting ready for bed, fell and hit head on floor. LEFT eyebrow laceration. Possible loss of consciousness. History of hypertension and diabetes. EXAM: CT HEAD WITHOUT CONTRAST CT CERVICAL SPINE WITHOUT CONTRAST TECHNIQUE: Multidetector CT imaging of the head and cervical spine was performed following the standard protocol without intravenous contrast. Multiplanar CT image reconstructions of the cervical spine were also generated. COMPARISON:  MRI of the brain August 13, 2015 FINDINGS: CT HEAD FINDINGS INTRACRANIAL CONTENTS: The ventricles and sulci are normal for age. No intraparenchymal hemorrhage, mass effect nor midline shift. Patchy supratentorial white matter hypodensities are less than expected for patient's age  and though non-specific likely represent chronic small vessel ischemic disease. No acute large vascular territory infarcts. No abnormal extra-axial fluid collections. Basal cisterns are patent. Moderate calcific atherosclerosis of the carotid siphons. ORBITS: The included ocular globes and orbital contents are non-suspicious. Status post bilateral ocular lens implants. SINUSES: The mastoid aircells and included paranasal sinuses are well-aerated. SKULL/SOFT TISSUES: Superficial LEFT periorbital skin defect most compatible laceration without  significant hematoma. No subcutaneous gas or radiopaque foreign bodies. CT CERVICAL SPINE FINDINGS OSSEOUS STRUCTURES: Cervical vertebral bodies intact. Grade 1 C4-5 anterolisthesis associated with severe RIGHT C4-5 facet arthropathy. Moderate C5-6 and C6-7 disc height loss with endplate sclerosis/uncovertebral hypertrophy. Mild C4-5 degenerative disc. No destructive bony lesions. C1-2 articulation maintained with severe arthropathy. Longus coli insertional enthesopathy. Severe RIGHT C4-5, RIGHT C5-6 and RIGHT greater than LEFT C6-7 neural foraminal narrowing. Mild canal stenosis C6-7 due to a RIGHT central small disc osteophyte complex. SOFT TISSUES: Included prevertebral and paraspinal soft tissues are unremarkable. IMPRESSION: CT HEAD: LEFT periorbital soft tissue superficial laceration. Otherwise negative CT HEAD for age. CT CERVICAL SPINE: Straightened cervical lordosis without acute fracture. Grade 1 C3-4 anterolisthesis on degenerative basis. Mild canal stenosis C6-7. C4-5 through C6-7 severe neural foraminal narrowing. Electronically Signed   By: Elon Alas M.D.   On: 04/28/2016 04:04   Ct Cervical Spine Wo Contrast  04/28/2016  CLINICAL DATA:  Lost balance getting ready for bed, fell and hit head on floor. LEFT eyebrow laceration. Possible loss of consciousness. History of hypertension and diabetes. EXAM: CT HEAD WITHOUT CONTRAST CT CERVICAL SPINE WITHOUT  CONTRAST TECHNIQUE: Multidetector CT imaging of the head and cervical spine was performed following the standard protocol without intravenous contrast. Multiplanar CT image reconstructions of the cervical spine were also generated. COMPARISON:  MRI of the brain August 13, 2015 FINDINGS: CT HEAD FINDINGS INTRACRANIAL CONTENTS: The ventricles and sulci are normal for age. No intraparenchymal hemorrhage, mass effect nor midline shift. Patchy supratentorial white matter hypodensities are less than expected for patient's age and though non-specific likely represent chronic small vessel ischemic disease. No acute large vascular territory infarcts. No abnormal extra-axial fluid collections. Basal cisterns are patent. Moderate calcific atherosclerosis of the carotid siphons. ORBITS: The included ocular globes and orbital contents are non-suspicious. Status post bilateral ocular lens implants. SINUSES: The mastoid aircells and included paranasal sinuses are well-aerated. SKULL/SOFT TISSUES: Superficial LEFT periorbital skin defect most compatible laceration without significant hematoma. No subcutaneous gas or radiopaque foreign bodies. CT CERVICAL SPINE FINDINGS OSSEOUS STRUCTURES: Cervical vertebral bodies intact. Grade 1 C4-5 anterolisthesis associated with severe RIGHT C4-5 facet arthropathy. Moderate C5-6 and C6-7 disc height loss with endplate sclerosis/uncovertebral hypertrophy. Mild C4-5 degenerative disc. No destructive bony lesions. C1-2 articulation maintained with severe arthropathy. Longus coli insertional enthesopathy. Severe RIGHT C4-5, RIGHT C5-6 and RIGHT greater than LEFT C6-7 neural foraminal narrowing. Mild canal stenosis C6-7 due to a RIGHT central small disc osteophyte complex. SOFT TISSUES: Included prevertebral and paraspinal soft tissues are unremarkable. IMPRESSION: CT HEAD: LEFT periorbital soft tissue superficial laceration. Otherwise negative CT HEAD for age. CT CERVICAL SPINE: Straightened  cervical lordosis without acute fracture. Grade 1 C3-4 anterolisthesis on degenerative basis. Mild canal stenosis C6-7. C4-5 through C6-7 severe neural foraminal narrowing. Electronically Signed   By: Elon Alas M.D.   On: 04/28/2016 04:04   US Abdomen Limited Ruq  04/28/2016  CLINICAL DATA:  Emesis. Elevated liver function studies. Elevated bili Rubin. EXAM: US ABDOMEN LIMITED - RIGHT UPPER QUADRANT COMPARISON:  12/21/2015 FINDINGS: Gallbladder: Sludge and small stones layering in the gallbladder. No gallbladder wall thickening. Murphy's sign is negative. Common bile duct: Diameter: 4.2 mm, normal Liver: Diffusely increased hepatic parenchymal echotexture suggesting diffuse fatty infiltration. IMPRESSION: Sludge and small stones layering in the gallbladder. No bile duct dilatation. Electronically Signed   By: Lucienne Capers M.D.   On: 04/28/2016 05:15   I have personally reviewed and evaluated these lab results as  part of my medical decision-making.   EKG Interpretation   Date/Time:  Friday Apr 28 2016 03:02:17 EDT Ventricular Rate:  87 PR Interval:  191 QRS Duration: 100 QT Interval:  356 QTC Calculation: 428 R Axis:   -5 Text Interpretation:  Sinus rhythm Abnormal R-wave progression, early  transition Borderline T abnormalities, inferior leads No significant  change since last tracing Confirmed by Cohl Behrens MD, Laurianne Floresca [id number]) on  04/28/2016 4:41:53 AM      MDM   Final diagnoses:  Acute hepatitis  Laceration of head, initial encounter  Head injury, initial encounter   Patient presents with for head laceration after he lost his balance and fell, then lost consciousness for a brief period. On exam, the patient was awake and alert, GCS 15. Vital signs notable for mild hypertension. Normal neurologic exam. 3 cm laceration above his left eyebrow. No abdominal tenderness. His urine in urinal appeared very dark and because of his episode of vomiting yesterday, obtained above lab work.  EKG unchanged from previous. Obtained CT of head and C-spine because of fall with loss of consciousness; CT showed no acute injury.  Labwork showed glucose 235, creatinine 1.17, AST 496, ALT 468, alkaline phosphatase 233, bilirubin 3.4. Urine contained large amount of bilirubin, patient denies any urinary symptoms. WBC 11.7, hemoglobin 12.5, CK elevated at 1141. Gave the patient 2 L of IV fluids and obtained right upper quadrant ultrasound which showed sludge and stones in the gallbladder without any inflammation or common bile duct dilatation. Fatty infiltration of liver. On reexamination, the patient had no abdominal tenderness and denied any of the nausea symptoms from yesterday. Because of his lab work which suggests hepatitis, I discussed with gastroenterology, Dr. Ardis Hughs. Appreciate his assistance. The patient has no risk factors for hepatitis B or C, denies any alcohol or Tylenol use. With his episode yesterday, his symptoms suggest a viral hepatitis. He will contact the patient to schedule a follow-up appointment and per his instruction, I have told the patient to contact the clinic immediately if he has any worsening nausea, vomiting, or fever. Patient should also return to ER if he develops any abdominal pain.  Regarding his head laceration, I repaired at bedside; see procedure note for details. Patient tolerated procedure well. Discussed supportive care and reviewed return precautions including any signs of infection. Instructed to follow-up with PCP in one week for suture removal. After several hours in the ED, the patient was well-appearing and denied any complaints. I discussed supportive care for concussion and patient his wife voiced understanding of follow-up plan. Patient discharged in satisfactory condition.  I personally performed the services described in this documentation, which was scribed in my presence. The recorded information has been reviewed and is accurate.     Sharlett Iles, MD 04/28/16 [phone]

## 2016-04-28 NOTE — Telephone Encounter (Signed)
I was called about this man from ER this morning.  He went to ER after falling and lacerating his head, short LOC.  He vomited earlier in the day.  LFTs were elevated.  US showed stones in GB, non-dilated CBD.  I read in h and P he had brief abd pain with the vomiting.  ? Acute hepatitis, perhaps CBD stone (however non-dilated duct)  Patty, Please call him.  He needs further lab tests (today); acute hepatitis panel, ANA, AMA, ferritin, total iron, TIBC and MRI with MRCP.  He also needs to come in early next week for repeat labs (cbc, cmet).  If he has fevers, chills, recurrent pains he needs to go back to the ER.

## 2016-04-28 NOTE — ED Notes (Signed)
Per lab staff, blood for CK is in the lab and will be run

## 2016-04-28 NOTE — Telephone Encounter (Signed)
I have left message for the patient to call back 

## 2016-04-30 LAB — URINE CULTURE
Culture: >=100000 — AB
Special Requests: NORMAL

## 2016-05-01 ENCOUNTER — Other Ambulatory Visit (INDEPENDENT_AMBULATORY_CARE_PROVIDER_SITE_OTHER): Payer: Medicare Other

## 2016-05-01 ENCOUNTER — Telehealth (HOSPITAL_BASED_OUTPATIENT_CLINIC_OR_DEPARTMENT_OTHER): Payer: Self-pay | Admitting: Emergency Medicine

## 2016-05-01 DIAGNOSIS — R7989 Other specified abnormal findings of blood chemistry: Secondary | ICD-10-CM

## 2016-05-01 DIAGNOSIS — R945 Abnormal results of liver function studies: Principal | ICD-10-CM

## 2016-05-01 LAB — COMPREHENSIVE METABOLIC PANEL
ALT: 134 U/L — ABNORMAL HIGH (ref 0–53)
AST: 34 U/L (ref 0–37)
Albumin: 3.8 g/dL (ref 3.5–5.2)
Alkaline Phosphatase: 156 U/L — ABNORMAL HIGH (ref 39–117)
BUN: 15 mg/dL (ref 6–23)
CO2: 26 mEq/L (ref 19–32)
Calcium: 9.6 mg/dL (ref 8.4–10.5)
Chloride: 101 mEq/L (ref 96–112)
Creatinine, Ser: 0.92 mg/dL (ref 0.40–1.50)
GFR: 104.48 mL/min (ref >60.00)
Glucose, Bld: 229 mg/dL — ABNORMAL HIGH (ref 70–99)
Potassium: 4.1 mEq/L (ref 3.5–5.1)
Sodium: 136 mEq/L (ref 135–145)
Total Bilirubin: 0.7 mg/dL (ref 0.2–1.2)
Total Protein: 8.1 g/dL (ref 6.0–8.3)

## 2016-05-01 LAB — CBC WITH DIFFERENTIAL/PLATELET
Basophils Absolute: 0 10*3/uL (ref 0.0–0.1)
Basophils Relative: 0.6 % (ref 0.0–3.0)
Eosinophils Absolute: 0.3 10*3/uL (ref 0.0–0.7)
Eosinophils Relative: 4.6 % (ref 0.0–5.0)
HCT: 39.2 % (ref 39.0–52.0)
Hemoglobin: 12.8 g/dL — ABNORMAL LOW (ref 13.0–17.0)
Lymphocytes Relative: 25 % (ref 12.0–46.0)
Lymphs Abs: 1.6 10*3/uL (ref 0.7–4.0)
MCHC: 32.7 g/dL (ref 30.0–36.0)
MCV: 83 fl (ref 78.0–100.0)
Monocytes Absolute: 0.5 10*3/uL (ref 0.1–1.0)
Monocytes Relative: 7.6 % (ref 3.0–12.0)
Neutro Abs: 4 10*3/uL (ref 1.4–7.7)
Neutrophils Relative %: 62.2 % (ref 43.0–77.0)
Platelets: 250 10*3/uL (ref 150.0–400.0)
RBC: 4.73 Mil/uL (ref 4.22–5.81)
RDW: 15.6 % — ABNORMAL HIGH (ref 11.5–15.5)
WBC: 6.5 10*3/uL (ref 4.0–10.5)

## 2016-05-01 LAB — IBC PANEL
Iron: 55 ug/dL (ref 42–165)
Saturation Ratios: 17.4 % — ABNORMAL LOW (ref 20.0–50.0)
Transferrin: 226 mg/dL (ref 212.0–360.0)

## 2016-05-01 LAB — FERRITIN: Ferritin: 56.6 ng/mL (ref 22.0–322.0)

## 2016-05-01 NOTE — Telephone Encounter (Signed)
Pt aware to come in today for labs and next week for repeat labs MRI MRCP to be scheduled.   You have been scheduled for an MRI / MRCP at [facility] on 05/04/16. Your appointment time is 7 pm. Please arrive 15 minutes prior to your appointment time for registration purposes. Please make certain not to have anything to eat or drink 4 hours prior to your test. In addition, if you have any metal in your body, have a pacemaker or defibrillator, please be sure to let your ordering physician know. This test typically takes 45 minutes to 1 hour to complete.  Pt has been notified of all recommendations and MRI instructions, We will call with the results as soon as available.  Also, he will go to the ED if he develops fever, chills or abd pain.

## 2016-05-01 NOTE — Progress Notes (Signed)
ED Antimicrobial Stewardship Positive Culture Follow Up   Kevin [name] is an 70 y.o. male who presented to [facility] on 04/28/2016 with a chief complaint of  Chief Complaint  Patient presents with  . Head Laceration  . Fall    Recent Results (from the past 720 hour(s))  Urine culture     Status: Abnormal   Collection Time: 04/28/16  3:16 AM  Result Value Ref Range Status   Specimen Description URINE, CLEAN CATCH  Final   Special Requests Normal  Final   Culture (A)  Final    >=100,000 COLONIES/mL STAPHYLOCOCCUS SPECIES (COAGULASE NEGATIVE)   Report Status 04/30/2016 FINAL  Final   Organism ID, Bacteria STAPHYLOCOCCUS SPECIES (COAGULASE NEGATIVE) (A)  Final      Susceptibility   Staphylococcus species (coagulase negative) - MIC*    CIPROFLOXACIN >=8 RESISTANT Resistant     GENTAMICIN <=0.5 SENSITIVE Sensitive     NITROFURANTOIN <=16 SENSITIVE Sensitive     OXACILLIN >=4 RESISTANT Resistant     TETRACYCLINE <=1 SENSITIVE Sensitive     VANCOMYCIN 1 SENSITIVE Sensitive     TRIMETH/SULFA 160 RESISTANT Resistant     CLINDAMYCIN <=0.25 SENSITIVE Sensitive     RIFAMPIN <=0.5 SENSITIVE Sensitive     Inducible Clindamycin NEGATIVE Sensitive     * >=100,000 COLONIES/mL STAPHYLOCOCCUS SPECIES (COAGULASE NEGATIVE)    No urinary symptoms and UA contaminated. No treatment indicated.  ED Provider: Domenic Moras, PA-C  Cassie L. Nicole Kindred, PharmD PGY2 Infectious Diseases Pharmacy Resident Pager: [phone] 05/01/2016 9:35 AM

## 2016-05-01 NOTE — Telephone Encounter (Signed)
Post ED Visit - Positive Culture Follow-up  Culture report reviewed by antimicrobial stewardship pharmacist:  []  Elenor Quinones, Pharm.D. []  Heide Guile, Pharm.D., BCPS []  Parks Neptune, Pharm.D. []  Alycia Rossetti, Pharm.D., BCPS []  [place], Pharm.D., BCPS, AAHIVP []  Legrand Como, Pharm.D., BCPS, AAHIVP [x]  Milus Glazier, Pharm.D. []  Stephens November, Pharm.D.  Positive urine culture Treated with none, asymptomatic, no further patient follow-up is required at this time.  Hazle Nordmann 05/01/2016, 10:29 AM

## 2016-05-01 NOTE — Telephone Encounter (Signed)
patient calling back. Call him at [phone]. He left a voicemail on my extention.

## 2016-05-02 LAB — HEPATITIS PANEL, ACUTE
HCV Ab: NEGATIVE
Hep A IgM: NONREACTIVE
Hep B C IgM: NONREACTIVE
Hepatitis B Surface Ag: NEGATIVE

## 2016-05-03 LAB — ANA: Anti Nuclear Antibody(ANA): NEGATIVE

## 2016-05-03 LAB — MITOCHONDRIAL ANTIBODIES: Mitochondrial M2 Ab, IgG: <=20 Units (ref <=20.0)

## 2016-05-04 ENCOUNTER — Ambulatory Visit (HOSPITAL_COMMUNITY)
Admission: RE | Admit: 2016-05-04 | Discharge: 2016-05-04 | Disposition: A | Discharge status: Home / Self Care | Payer: Medicare Other | Source: Ambulatory Visit | Attending: Gastroenterology | Admitting: Gastroenterology

## 2016-05-04 ENCOUNTER — Other Ambulatory Visit: Payer: Self-pay | Admitting: Gastroenterology

## 2016-05-04 DIAGNOSIS — R7989 Other specified abnormal findings of blood chemistry: Secondary | ICD-10-CM

## 2016-05-04 DIAGNOSIS — K8020 Calculus of gallbladder without cholecystitis without obstruction: Secondary | ICD-10-CM | POA: Insufficient documentation

## 2016-05-04 DIAGNOSIS — K449 Diaphragmatic hernia without obstruction or gangrene: Secondary | ICD-10-CM | POA: Insufficient documentation

## 2016-05-04 DIAGNOSIS — K862 Cyst of pancreas: Secondary | ICD-10-CM | POA: Insufficient documentation

## 2016-05-04 DIAGNOSIS — R945 Abnormal results of liver function studies: Principal | ICD-10-CM

## 2016-05-04 MED ORDER — GADOBENATE DIMEGLUMINE 529 MG/ML IV SOLN
20.0000 mL | Freq: Once | INTRAVENOUS | Status: AC | PRN
  Administered 2016-05-04: 20 mL via INTRAVENOUS

## 2016-05-16 ENCOUNTER — Ambulatory Visit: Payer: Self-pay | Admitting: Surgery

## 2016-06-12 NOTE — Progress Notes (Signed)
Please clarify surgical consent; Abbreviations noted. Thanks. Pt has preop appt scheduled for 06/13/2016.

## 2016-06-13 ENCOUNTER — Encounter (HOSPITAL_COMMUNITY)
Admission: RE | Admit: 2016-06-13 | Discharge: 2016-06-13 | Disposition: A | Discharge status: Home / Self Care | Payer: Medicare Other | Source: Ambulatory Visit | Attending: Surgery | Admitting: Surgery

## 2016-06-13 ENCOUNTER — Encounter (HOSPITAL_COMMUNITY): Payer: Self-pay

## 2016-06-13 DIAGNOSIS — I10 Essential (primary) hypertension: Secondary | ICD-10-CM | POA: Diagnosis not present

## 2016-06-13 DIAGNOSIS — Z794 Long term (current) use of insulin: Secondary | ICD-10-CM | POA: Diagnosis not present

## 2016-06-13 DIAGNOSIS — K219 Gastro-esophageal reflux disease without esophagitis: Secondary | ICD-10-CM | POA: Diagnosis not present

## 2016-06-13 DIAGNOSIS — G4730 Sleep apnea, unspecified: Secondary | ICD-10-CM | POA: Diagnosis not present

## 2016-06-13 DIAGNOSIS — E119 Type 2 diabetes mellitus without complications: Secondary | ICD-10-CM | POA: Diagnosis not present

## 2016-06-13 DIAGNOSIS — K449 Diaphragmatic hernia without obstruction or gangrene: Secondary | ICD-10-CM | POA: Diagnosis not present

## 2016-06-13 DIAGNOSIS — Z79899 Other long term (current) drug therapy: Secondary | ICD-10-CM | POA: Diagnosis not present

## 2016-06-13 DIAGNOSIS — K8010 Calculus of gallbladder with chronic cholecystitis without obstruction: Secondary | ICD-10-CM | POA: Diagnosis not present

## 2016-06-13 DIAGNOSIS — I739 Peripheral vascular disease, unspecified: Secondary | ICD-10-CM | POA: Diagnosis not present

## 2016-06-13 DIAGNOSIS — K8020 Calculus of gallbladder without cholecystitis without obstruction: Secondary | ICD-10-CM | POA: Diagnosis present

## 2016-06-13 DIAGNOSIS — Z87891 Personal history of nicotine dependence: Secondary | ICD-10-CM | POA: Diagnosis not present

## 2016-06-13 DIAGNOSIS — Z79891 Long term (current) use of opiate analgesic: Secondary | ICD-10-CM | POA: Diagnosis not present

## 2016-06-13 HISTORY — DX: Unspecified fall, initial encounter: W19.XXXA

## 2016-06-13 HISTORY — DX: Tremor, unspecified: R25.1

## 2016-06-13 HISTORY — DX: Presence of spectacles and contact lenses: Z97.3

## 2016-06-13 HISTORY — DX: Personal history of other diseases of the digestive system: Z87.19

## 2016-06-13 HISTORY — DX: Syncope and collapse: R55

## 2016-06-13 LAB — CBC
HCT: 41.3 % (ref 39.0–52.0)
Hemoglobin: 13.3 g/dL (ref 13.0–17.0)
MCH: 27.3 pg (ref 26.0–34.0)
MCHC: 32.2 g/dL (ref 30.0–36.0)
MCV: 84.8 fL (ref 78.0–100.0)
Platelets: 233 10*3/uL (ref 150–400)
RBC: 4.87 MIL/uL (ref 4.22–5.81)
RDW: 15 % (ref 11.5–15.5)
WBC: 8.1 10*3/uL (ref 4.0–10.5)

## 2016-06-13 LAB — COMPREHENSIVE METABOLIC PANEL
ALT: 19 U/L (ref 17–63)
AST: 19 U/L (ref 15–41)
Albumin: 4.1 g/dL (ref 3.5–5.0)
Alkaline Phosphatase: 64 U/L (ref 38–126)
Anion gap: 9 (ref 5–15)
BUN: 17 mg/dL (ref 6–20)
CO2: 26 mmol/L (ref 22–32)
Calcium: 9.2 mg/dL (ref 8.9–10.3)
Chloride: 103 mmol/L (ref 101–111)
Creatinine, Ser: 1.07 mg/dL (ref 0.61–1.24)
GFR calc Af Amer: >60 mL/min (ref >60)
GFR calc non Af Amer: >60 mL/min (ref >60)
Glucose, Bld: 155 mg/dL — ABNORMAL HIGH (ref 65–99)
Potassium: 3.9 mmol/L (ref 3.5–5.1)
Sodium: 138 mmol/L (ref 135–145)
Total Bilirubin: 0.9 mg/dL (ref 0.3–1.2)
Total Protein: 8.3 g/dL — ABNORMAL HIGH (ref 6.5–8.1)

## 2016-06-13 LAB — PROTIME-INR
INR: 1.04 (ref 0.00–1.49)
Prothrombin Time: 13.8 seconds (ref 11.6–15.2)

## 2016-06-13 NOTE — Patient Instructions (Signed)
Kevin [name]  06/13/2016   Your procedure is scheduled on: Thursday June 15, 2016  Report to [facility]  Entrance take [place]  elevators to 3rd floor to  [place] at 5:30 AM.  Call this number if you have problems the morning of surgery [phone]   Remember: ONLY 1 PERSON MAY GO WITH YOU TO SHORT STAY TO GET  READY MORNING OF [place].  Do not eat food or drink liquids :After Midnight.     Take these medicines the morning of surgery with A SIP OF WATER: Amlodipine (Norvasc); Omeprazole (Prilosec); May take oxycodone if needed;              TAKE 1/2 DOSE OF EVENING INSULIN NIGHT PRIOR TO SURGERY  DO NOT TAKE ANY DIABETIC MEDICATIONS DAY OF YOUR SURGERY                               You may not have any metal on your body including hair pins and              piercings  Do not wear jewelry,  lotions, powders or colognes, deodorant             Men may shave face and neck.   Do not bring valuables to the hospital. [place].  Contacts, dentures or bridgework may not be worn into surgery.  Leave suitcase in the car. After surgery it may be brought to your room.         _____________________________________________________________________             [facility] - Preparing for Surgery Before surgery, you can play an important role.  Because skin is not sterile, your skin needs to be as free of germs as possible.  You can reduce the number of germs on your skin by washing with CHG (chlorahexidine gluconate) soap before surgery.  CHG is an antiseptic cleaner which kills germs and bonds with the skin to continue killing germs even after washing. Please DO NOT use if you have an allergy to CHG or antibacterial soaps.  If your skin becomes reddened/irritated stop using the CHG and inform your nurse when you arrive at Short Stay. Do not shave (including legs and underarms) for at least 48 hours  prior to the first CHG shower.  You may shave your face/neck. Please follow these instructions carefully:  1.  Shower with CHG Soap the night before surgery and the  morning of Surgery.  2.  If you choose to wash your hair, wash your hair first as usual with your  normal  shampoo.  3.  After you shampoo, rinse your hair and body thoroughly to remove the  shampoo.                           4.  Use CHG as you would any other liquid soap.  You can apply chg directly  to the skin and wash                       Gently with a scrungie or clean washcloth.  5.  Apply the CHG Soap to your body  ONLY FROM THE NECK DOWN.   Do not use on face/ open                           Wound or open sores. Avoid contact with eyes, ears mouth and genitals (private parts).                       Wash face,  Genitals (private parts) with your normal soap.             6.  Wash thoroughly, paying special attention to the area where your surgery  will be performed.  7.  Thoroughly rinse your body with warm water from the neck down.  8.  DO NOT shower/wash with your normal soap after using and rinsing off  the CHG Soap.                9.  Pat yourself dry with a clean towel.            10.  Wear clean pajamas.            11.  Place clean sheets on your bed the night of your first shower and do not  sleep with pets. Day of Surgery : Do not apply any lotions/deodorants the morning of surgery.  Please wear clean clothes to the hospital/surgery center.  FAILURE TO FOLLOW THESE INSTRUCTIONS MAY RESULT IN THE CANCELLATION OF YOUR SURGERY PATIENT SIGNATURE_________________________________  NURSE SIGNATURE__________________________________  ________________________________________________________________________

## 2016-06-14 ENCOUNTER — Encounter (HOSPITAL_COMMUNITY): Payer: Self-pay | Admitting: Surgery

## 2016-06-14 DIAGNOSIS — K8010 Calculus of gallbladder with chronic cholecystitis without obstruction: Secondary | ICD-10-CM | POA: Diagnosis present

## 2016-06-14 LAB — HEMOGLOBIN A1C
Hgb A1c MFr Bld: 9 % — ABNORMAL HIGH (ref 4.8–5.6)
Mean Plasma Glucose: 212 mg/dL

## 2016-06-14 NOTE — Progress Notes (Signed)
A1C results in epic per PAT visit 06/13/2016 sent to Dr Harlow Asa

## 2016-06-14 NOTE — H&P (Signed)
General Surgery [facility]  Kevin [name] DOB: 04/15/1946 Married / Language: English / Race: White Male  History of Present Illness  The patient is a 70 year old male who presents for evaluation of gall stones.  Patient is referred by Dr. Lavone Orn for management of symptomatic cholelithiasis. Patient sustained a fall on Apr 27, 2016. Evaluation included laboratory studies showing elevated liver function test. Patient underwent an abdominal ultrasound on Apr 28, 2016. This showed multiple small gallstones layering in the gallbladder with gallbladder sludge. Subsequent MRI scan on May 04, 2016 showed multiple gallstones. There was mild biliary dilatation. There was no sign of choledocholithiasis. Patient has had intermittent episodes of right-sided abdominal pain which he describes as stabbing in nature. He has not had true biliary colic. Patient has had previous abdominal surgery with multiple attempts at umbilical hernia repair including laparoscopic repair with mesh. No other abdominal surgery. Patient has a family history of gallbladder disease in his sister. He denies jaundice or acholic stools. He has no history of hepatitis or pancreatitis.   Allergies No Known Drug Allergies  Medication History AmLODIPine Besylate (5MG  Tablet, Oral) Active. OxyCODONE HCl (10MG  Tablet, Oral) Active. Atorvastatin Calcium (80MG  Tablet, Oral) Active. Furosemide (40MG  Tablet, Oral) Active. HumaLOG KwikPen (100UNIT/ML Soln Pen-inj, Subcutaneous) Active. FreeStyle Lite Test (In Vitro) Active. MetFORMIN HCl (1000MG  Tablet, Oral) Active. Losartan Potassium (100MG  Tablet, Oral) Active. Omeprazole (40MG  Capsule DR, Oral) Active. NovoLIN 70/30 ((70-30) 100UNIT/ML Suspension, Subcutaneous) Active. Klor-Con M20 [facility] Tablet ER, Oral) Active. Medications Reconciled  Vitals Weight: 255 lb Height: 72in Body Surface Area: 2.36 m Body Mass Index:  34.58 kg/m  Temp.: 21F(Temporal)  Pulse: 76 (Regular)  BP: 130/78 (Sitting, Left Arm, Standard)  Physical Exam  General - appears comfortable, no distress; not diaphorectic  HEENT - normocephalic; sclerae clear, gaze conjugate; mucous membranes moist, dentition good; voice normal  Neck - symmetric on extension; no palpable anterior or posterior cervical adenopathy; no palpable masses in the thyroid bed  Chest - clear bilaterally without rhonchi, rales, or wheeze  Cor - regular rhythm with normal rate; no significant murmur  Abd - soft without distension; well-healed midline incision at level of umbilicus; moderate diastases consistent with previous abdominal hernia surgery. Incisions are indicative of laparoscopic repair. Palpation shows no fascial defect. Right upper quadrant is soft and nontender without mass. No hepatosplenomegaly.  Ext - non-tender without significant edema or lymphedema  Neuro - grossly intact; no tremor   Assessment & Plan  CHOLELITHIASIS (K80.20)  Patient presents with a history of intermittent right sided abdominal pain and recent abnormal liver function tests. Patient is provided with written literature on gallbladder disease to review at home.  Patient has diagnostic studies indicating multiple small gallstones and possible mild bile duct dilatation. He has been recommended for cholecystectomy for management. Patient denied discussed laparoscopic cholecystectomy. We discussed potential difficulties given his previous abdominal hernia repairs. Likely he can have the surgery as a laparoscopic procedure but there is a small chance of conversion to open surgery. Patient is provided with written literature to review at home.  I would recommend laparoscopic cholecystectomy with intraoperative cholangiography. I recommended an overnight stay given the extent of the procedure and his concurrent medical problems including diabetes mellitus. Risk and benefits  of the procedure been discussed. Patient understands and wishes to proceed with surgery in the near future.  The risks and benefits of the procedure have been discussed at length with the patient. The patient understands the  proposed procedure, potential alternative treatments, and the course of recovery to be expected. All of the patient's questions have been answered at this time. The patient wishes to proceed with surgery.  Earnstine Regal, MD, [facility] Office: [phone]

## 2016-06-14 NOTE — Anesthesia Preprocedure Evaluation (Addendum)
Anesthesia Evaluation  Patient identified by MRN, date of birth, ID band Patient awake    Reviewed: Allergy & Precautions, NPO status , Patient's Chart, lab work & pertinent test results  Airway Mallampati: I  TM Distance: >3 FB Neck ROM: Full    Dental  (+) Teeth Intact, Dental Advisory Given   Pulmonary asthma , sleep apnea , former smoker,    breath sounds clear to auscultation- rhonchi       Cardiovascular hypertension, Pt. on medications + Peripheral Vascular Disease   Rhythm:Regular Rate:Normal     Neuro/Psych PSYCHIATRIC DISORDERS negative neurological ROS     GI/Hepatic Neg liver ROS, hiatal hernia, GERD  Medicated,  Endo/Other  diabetes, Type 2, Oral Hypoglycemic Agents  Renal/GU negative Renal ROS  negative genitourinary   Musculoskeletal  (+) Arthritis ,   Abdominal   Peds negative pediatric ROS (+)  Hematology negative hematology ROS (+)   Anesthesia Other Findings   Reproductive/Obstetrics negative OB ROS                            Lab Results  Component Value Date   WBC 8.1 06/13/2016   HGB 13.3 06/13/2016   HCT 41.3 06/13/2016   MCV 84.8 06/13/2016   PLT 233 06/13/2016   Lab Results  Component Value Date   CREATININE 1.07 06/13/2016   BUN 17 06/13/2016   NA 138 06/13/2016   K 3.9 06/13/2016   CL 103 06/13/2016   CO2 26 06/13/2016   Lab Results  Component Value Date   INR 1.04 06/13/2016   INR 1.29 01/01/2013   04/2016 EKG: normal EKG, normal sinus rhythm.    Anesthesia Physical Anesthesia Plan  ASA: II  Anesthesia Plan: General   Post-op Pain Management:    Induction: Intravenous  Airway Management Planned: Oral ETT  Additional Equipment:   Intra-op Plan:   Post-operative Plan: Extubation in OR  Informed Consent: I have reviewed the patients History and Physical, chart, labs and discussed the procedure including the risks, benefits and  alternatives for the proposed anesthesia with the patient or authorized representative who has indicated his/her understanding and acceptance.   Dental advisory given  Plan Discussed with: CRNA  Anesthesia Plan Comments:         Anesthesia Quick Evaluation

## 2016-06-15 ENCOUNTER — Encounter (HOSPITAL_COMMUNITY)
Admission: RE | Disposition: A | Discharge status: Home / Self Care | Payer: Self-pay | Source: Ambulatory Visit | Attending: Surgery

## 2016-06-15 ENCOUNTER — Ambulatory Visit (HOSPITAL_COMMUNITY): Payer: Medicare Other | Admitting: Anesthesiology

## 2016-06-15 ENCOUNTER — Encounter (HOSPITAL_COMMUNITY): Payer: Self-pay | Admitting: *Deleted

## 2016-06-15 ENCOUNTER — Observation Stay (HOSPITAL_COMMUNITY)
Admission: RE | Admit: 2016-06-15 | Discharge: 2016-06-16 | Disposition: A | Discharge status: Home / Self Care | Payer: Medicare Other | Source: Ambulatory Visit | Attending: Surgery | Admitting: Surgery

## 2016-06-15 ENCOUNTER — Ambulatory Visit (HOSPITAL_COMMUNITY): Payer: Medicare Other

## 2016-06-15 DIAGNOSIS — I10 Essential (primary) hypertension: Secondary | ICD-10-CM | POA: Diagnosis not present

## 2016-06-15 DIAGNOSIS — Z79899 Other long term (current) drug therapy: Secondary | ICD-10-CM | POA: Insufficient documentation

## 2016-06-15 DIAGNOSIS — Z794 Long term (current) use of insulin: Secondary | ICD-10-CM | POA: Insufficient documentation

## 2016-06-15 DIAGNOSIS — Z79891 Long term (current) use of opiate analgesic: Secondary | ICD-10-CM | POA: Insufficient documentation

## 2016-06-15 DIAGNOSIS — E119 Type 2 diabetes mellitus without complications: Secondary | ICD-10-CM | POA: Insufficient documentation

## 2016-06-15 DIAGNOSIS — G4730 Sleep apnea, unspecified: Secondary | ICD-10-CM | POA: Insufficient documentation

## 2016-06-15 DIAGNOSIS — K8010 Calculus of gallbladder with chronic cholecystitis without obstruction: Principal | ICD-10-CM | POA: Insufficient documentation

## 2016-06-15 DIAGNOSIS — Z87891 Personal history of nicotine dependence: Secondary | ICD-10-CM | POA: Insufficient documentation

## 2016-06-15 DIAGNOSIS — Z791 Long term (current) use of non-steroidal anti-inflammatories (NSAID): Secondary | ICD-10-CM | POA: Diagnosis not present

## 2016-06-15 DIAGNOSIS — K449 Diaphragmatic hernia without obstruction or gangrene: Secondary | ICD-10-CM | POA: Insufficient documentation

## 2016-06-15 DIAGNOSIS — I739 Peripheral vascular disease, unspecified: Secondary | ICD-10-CM | POA: Insufficient documentation

## 2016-06-15 DIAGNOSIS — K219 Gastro-esophageal reflux disease without esophagitis: Secondary | ICD-10-CM | POA: Insufficient documentation

## 2016-06-15 DIAGNOSIS — Z419 Encounter for procedure for purposes other than remedying health state, unspecified: Secondary | ICD-10-CM

## 2016-06-15 HISTORY — PX: CHOLECYSTECTOMY: SHX55

## 2016-06-15 LAB — GLUCOSE, CAPILLARY
Glucose-Capillary: 154 mg/dL — ABNORMAL HIGH (ref 65–99)
Glucose-Capillary: 163 mg/dL — ABNORMAL HIGH (ref 65–99)
Glucose-Capillary: 214 mg/dL — ABNORMAL HIGH (ref 65–99)
Glucose-Capillary: 235 mg/dL — ABNORMAL HIGH (ref 65–99)
Glucose-Capillary: 313 mg/dL — ABNORMAL HIGH (ref 65–99)

## 2016-06-15 SURGERY — LAPAROSCOPIC CHOLECYSTECTOMY WITH INTRAOPERATIVE CHOLANGIOGRAM
Anesthesia: General | Site: Abdomen

## 2016-06-15 MED ORDER — ONDANSETRON HCL 4 MG/2ML IJ SOLN: 4.0000 mg | Freq: Four times a day (QID) | INTRAMUSCULAR | Status: DC | PRN

## 2016-06-15 MED ORDER — FUROSEMIDE 40 MG PO TABS: 40.0000 mg | ORAL_TABLET | Freq: Every day | ORAL | Status: DC | PRN

## 2016-06-15 MED ORDER — LIDOCAINE HCL (CARDIAC) 20 MG/ML IV SOLN
INTRAVENOUS | Status: AC
  Filled 2016-06-15: qty 5

## 2016-06-15 MED ORDER — EPHEDRINE SULFATE 50 MG/ML IJ SOLN
INTRAMUSCULAR | Status: DC | PRN
  Administered 2016-06-15: 10 mg via INTRAVENOUS
  Administered 2016-06-15: 5 mg via INTRAVENOUS

## 2016-06-15 MED ORDER — 0.9 % SODIUM CHLORIDE (POUR BTL) OPTIME
TOPICAL | Status: DC | PRN
  Administered 2016-06-15: 1000 mL

## 2016-06-15 MED ORDER — PROPOFOL 10 MG/ML IV BOLUS
INTRAVENOUS | Status: AC
  Filled 2016-06-15: qty 40

## 2016-06-15 MED ORDER — CEFAZOLIN SODIUM-DEXTROSE 2-4 GM/100ML-% IV SOLN
INTRAVENOUS | Status: AC
  Filled 2016-06-15: qty 100

## 2016-06-15 MED ORDER — LOSARTAN POTASSIUM 50 MG PO TABS: 100.0000 mg | ORAL_TABLET | Freq: Every day | ORAL | Status: DC

## 2016-06-15 MED ORDER — INSULIN ASPART PROT & ASPART (70-30 MIX) 100 UNIT/ML ~~LOC~~ SUSP: 20.0000 [IU] | Freq: Two times a day (BID) | SUBCUTANEOUS | Status: DC

## 2016-06-15 MED ORDER — METFORMIN HCL 500 MG PO TABS
1000.0000 mg | ORAL_TABLET | Freq: Two times a day (BID) | ORAL | Status: DC
  Administered 2016-06-15 – 2016-06-16 (×2): 1000 mg via ORAL
  Filled 2016-06-15 (×2): qty 2

## 2016-06-15 MED ORDER — ROCURONIUM BROMIDE 100 MG/10ML IV SOLN
INTRAVENOUS | Status: AC
  Filled 2016-06-15: qty 1

## 2016-06-15 MED ORDER — IOPAMIDOL (ISOVUE-300) INJECTION 61%
INTRAVENOUS | Status: AC
Start: 2016-06-15 — End: 2016-06-15
  Filled 2016-06-15: qty 50

## 2016-06-15 MED ORDER — DEXAMETHASONE SODIUM PHOSPHATE 10 MG/ML IJ SOLN
INTRAMUSCULAR | Status: AC
  Filled 2016-06-15: qty 1

## 2016-06-15 MED ORDER — FENTANYL CITRATE (PF) 100 MCG/2ML IJ SOLN
INTRAMUSCULAR | Status: AC
  Filled 2016-06-15: qty 2

## 2016-06-15 MED ORDER — INSULIN ASPART 100 UNIT/ML ~~LOC~~ SOLN
0.0000 [IU] | Freq: Every day | SUBCUTANEOUS | Status: DC
  Administered 2016-06-15: 4 [IU] via SUBCUTANEOUS

## 2016-06-15 MED ORDER — OXYCODONE HCL 5 MG PO TABS: 5.0000 mg | ORAL_TABLET | ORAL | Status: DC | PRN

## 2016-06-15 MED ORDER — LACTATED RINGERS IV SOLN: INTRAVENOUS | Status: DC

## 2016-06-15 MED ORDER — FENTANYL CITRATE (PF) 250 MCG/5ML IJ SOLN
INTRAMUSCULAR | Status: AC
  Filled 2016-06-15: qty 5

## 2016-06-15 MED ORDER — SUGAMMADEX SODIUM 200 MG/2ML IV SOLN
INTRAVENOUS | Status: AC
  Filled 2016-06-15: qty 2

## 2016-06-15 MED ORDER — HYDROMORPHONE HCL 1 MG/ML IJ SOLN
1.0000 mg | INTRAMUSCULAR | Status: DC | PRN
  Administered 2016-06-15: 1 mg via INTRAVENOUS
  Filled 2016-06-15: qty 1

## 2016-06-15 MED ORDER — MIDAZOLAM HCL 5 MG/5ML IJ SOLN
INTRAMUSCULAR | Status: DC | PRN
  Administered 2016-06-15: 1 mg via INTRAVENOUS

## 2016-06-15 MED ORDER — MEPERIDINE HCL 50 MG/ML IJ SOLN: 6.2500 mg | INTRAMUSCULAR | Status: DC | PRN

## 2016-06-15 MED ORDER — INSULIN LISPRO 100 UNIT/ML ~~LOC~~ SOLN: 50.0000 [IU] | Freq: Every day | SUBCUTANEOUS | Status: DC

## 2016-06-15 MED ORDER — LACTATED RINGERS IR SOLN
Status: DC | PRN
  Administered 2016-06-15: 2000 mL

## 2016-06-15 MED ORDER — SUGAMMADEX SODIUM 200 MG/2ML IV SOLN
INTRAVENOUS | Status: DC | PRN
  Administered 2016-06-15: 200 mg via INTRAVENOUS

## 2016-06-15 MED ORDER — ROCURONIUM BROMIDE 100 MG/10ML IV SOLN
INTRAVENOUS | Status: DC | PRN
Start: 2016-06-15 — End: 2016-06-15
  Administered 2016-06-15: 40 mg via INTRAVENOUS
  Administered 2016-06-15: 15 mg via INTRAVENOUS

## 2016-06-15 MED ORDER — LACTATED RINGERS IV SOLN
INTRAVENOUS | Status: DC | PRN
  Administered 2016-06-15 (×2): via INTRAVENOUS

## 2016-06-15 MED ORDER — AMLODIPINE BESYLATE 10 MG PO TABS
10.0000 mg | ORAL_TABLET | Freq: Every day | ORAL | Status: DC
  Administered 2016-06-16: 10 mg via ORAL
  Filled 2016-06-15 (×2): qty 1

## 2016-06-15 MED ORDER — INSULIN ASPART 100 UNIT/ML ~~LOC~~ SOLN
0.0000 [IU] | Freq: Three times a day (TID) | SUBCUTANEOUS | Status: DC
  Administered 2016-06-15 (×2): 5 [IU] via SUBCUTANEOUS

## 2016-06-15 MED ORDER — PROPOFOL 10 MG/ML IV BOLUS
INTRAVENOUS | Status: DC | PRN
  Administered 2016-06-15: 200 mg via INTRAVENOUS

## 2016-06-15 MED ORDER — LIDOCAINE HCL (CARDIAC) 20 MG/ML IV SOLN
INTRAVENOUS | Status: DC | PRN
  Administered 2016-06-15: 100 mg via INTRAVENOUS

## 2016-06-15 MED ORDER — ACETAMINOPHEN 325 MG PO TABS
650.0000 mg | ORAL_TABLET | Freq: Four times a day (QID) | ORAL | Status: DC | PRN
  Filled 2016-06-15: qty 2

## 2016-06-15 MED ORDER — FENTANYL CITRATE (PF) 100 MCG/2ML IJ SOLN
INTRAMUSCULAR | Status: DC | PRN
  Administered 2016-06-15: 100 ug via INTRAVENOUS
  Administered 2016-06-15 (×2): 50 ug via INTRAVENOUS
  Administered 2016-06-15: 100 ug via INTRAVENOUS
  Administered 2016-06-15: 50 ug via INTRAVENOUS

## 2016-06-15 MED ORDER — PHENYLEPHRINE 40 MCG/ML (10ML) SYRINGE FOR IV PUSH (FOR BLOOD PRESSURE SUPPORT)
PREFILLED_SYRINGE | INTRAVENOUS | Status: AC
  Filled 2016-06-15: qty 10

## 2016-06-15 MED ORDER — ONDANSETRON 4 MG PO TBDP: 4.0000 mg | ORAL_TABLET | Freq: Four times a day (QID) | ORAL | Status: DC | PRN

## 2016-06-15 MED ORDER — SODIUM CHLORIDE 0.9 % IJ SOLN
INTRAMUSCULAR | Status: AC
  Filled 2016-06-15: qty 10

## 2016-06-15 MED ORDER — POTASSIUM CHLORIDE IN NACL 20-0.45 MEQ/L-% IV SOLN
INTRAVENOUS | Status: DC
  Administered 2016-06-15 – 2016-06-16 (×2): via INTRAVENOUS
  Filled 2016-06-15 (×2): qty 1000

## 2016-06-15 MED ORDER — ONDANSETRON HCL 4 MG/2ML IJ SOLN
INTRAMUSCULAR | Status: DC | PRN
  Administered 2016-06-15: 4 mg via INTRAVENOUS

## 2016-06-15 MED ORDER — BUPIVACAINE-EPINEPHRINE 0.25% -1:200000 IJ SOLN
INTRAMUSCULAR | Status: DC | PRN
  Administered 2016-06-15: 20 mL

## 2016-06-15 MED ORDER — ACETAMINOPHEN 650 MG RE SUPP
650.0000 mg | Freq: Four times a day (QID) | RECTAL | Status: DC | PRN
Start: 2016-06-15 — End: 2016-06-16

## 2016-06-15 MED ORDER — FENTANYL CITRATE (PF) 100 MCG/2ML IJ SOLN
25.0000 ug | INTRAMUSCULAR | Status: DC | PRN
  Administered 2016-06-15 (×3): 50 ug via INTRAVENOUS

## 2016-06-15 MED ORDER — IBUPROFEN 200 MG PO TABS
400.0000 mg | ORAL_TABLET | Freq: Four times a day (QID) | ORAL | Status: DC | PRN
Start: 2016-06-15 — End: 2016-06-16
  Administered 2016-06-15: 400 mg via ORAL
  Filled 2016-06-15: qty 2

## 2016-06-15 MED ORDER — INSULIN ASPART 100 UNIT/ML ~~LOC~~ SOLN
0.0000 [IU] | Freq: Three times a day (TID) | SUBCUTANEOUS | Status: DC
  Administered 2016-06-16: 8 [IU] via SUBCUTANEOUS

## 2016-06-15 MED ORDER — DEXAMETHASONE SODIUM PHOSPHATE 10 MG/ML IJ SOLN
INTRAMUSCULAR | Status: DC | PRN
  Administered 2016-06-15: 10 mg via INTRAVENOUS

## 2016-06-15 MED ORDER — EPHEDRINE SULFATE 50 MG/ML IJ SOLN
INTRAMUSCULAR | Status: AC
  Filled 2016-06-15: qty 1

## 2016-06-15 MED ORDER — ONDANSETRON HCL 4 MG/2ML IJ SOLN
INTRAMUSCULAR | Status: AC
  Filled 2016-06-15: qty 2

## 2016-06-15 MED ORDER — PROMETHAZINE HCL 25 MG/ML IJ SOLN: 6.2500 mg | INTRAMUSCULAR | Status: DC | PRN

## 2016-06-15 MED ORDER — BUPIVACAINE-EPINEPHRINE 0.5% -1:200000 IJ SOLN: INTRAMUSCULAR | Status: DC | PRN

## 2016-06-15 MED ORDER — IOPAMIDOL (ISOVUE-300) INJECTION 61%
INTRAVENOUS | Status: DC | PRN
  Administered 2016-06-15: 14.5 mL

## 2016-06-15 MED ORDER — OXYCODONE HCL 5 MG PO TABS
5.0000 mg | ORAL_TABLET | ORAL | Status: DC | PRN
  Administered 2016-06-15 (×2): 10 mg via ORAL
  Filled 2016-06-15 (×2): qty 2

## 2016-06-15 MED ORDER — BUPIVACAINE-EPINEPHRINE 0.25% -1:200000 IJ SOLN
INTRAMUSCULAR | Status: AC
  Filled 2016-06-15: qty 1

## 2016-06-15 MED ORDER — CEFAZOLIN SODIUM-DEXTROSE 2-4 GM/100ML-% IV SOLN
2.0000 g | INTRAVENOUS | Status: AC
  Administered 2016-06-15: 2 g via INTRAVENOUS
  Filled 2016-06-15: qty 100

## 2016-06-15 MED ORDER — PHENYLEPHRINE HCL 10 MG/ML IJ SOLN
INTRAMUSCULAR | Status: DC | PRN
  Administered 2016-06-15: 80 ug via INTRAVENOUS
  Administered 2016-06-15: 120 ug via INTRAVENOUS
  Administered 2016-06-15: 40 ug via INTRAVENOUS
  Administered 2016-06-15: 120 ug via INTRAVENOUS

## 2016-06-15 MED ORDER — PANTOPRAZOLE SODIUM 40 MG PO TBEC
40.0000 mg | DELAYED_RELEASE_TABLET | Freq: Every day | ORAL | Status: DC
  Administered 2016-06-16: 40 mg via ORAL
  Filled 2016-06-15 (×2): qty 1

## 2016-06-15 MED ORDER — MIDAZOLAM HCL 2 MG/2ML IJ SOLN
INTRAMUSCULAR | Status: AC
  Filled 2016-06-15: qty 2

## 2016-06-15 SURGICAL SUPPLY — 44 items
APL SKNCLS STERI-STRIP NONHPOA (GAUZE/BANDAGES/DRESSINGS) ×1
APPLIER CLIP ROT 10 11.4 M/L (STAPLE) ×2
APR CLP MED LRG 11.4X10 (STAPLE) ×1
BAG SPEC RTRVL LRG 6X4 10 (ENDOMECHANICALS) ×1
BENZOIN TINCTURE PRP APPL 2/3 (GAUZE/BANDAGES/DRESSINGS) ×2 IMPLANT
CABLE HIGH FREQUENCY MONO STRZ (ELECTRODE) ×2 IMPLANT
CHLORAPREP W/TINT 26ML (MISCELLANEOUS) ×2 IMPLANT
CLIP APPLIE ROT 10 11.4 M/L (STAPLE) ×1 IMPLANT
COVER MAYO STAND STRL (DRAPES) ×2 IMPLANT
COVER SURGICAL LIGHT HANDLE (MISCELLANEOUS) ×2 IMPLANT
DECANTER SPIKE VIAL GLASS SM (MISCELLANEOUS) ×2 IMPLANT
DRAPE C-ARM 42X120 X-RAY (DRAPES) ×2 IMPLANT
DRAPE LAPAROSCOPIC ABDOMINAL (DRAPES) ×2 IMPLANT
ELECT REM PT RETURN 9FT ADLT (ELECTROSURGICAL) ×2
ELECTRODE REM PT RTRN 9FT ADLT (ELECTROSURGICAL) ×1 IMPLANT
GAUZE SPONGE 2X2 8PLY STRL LF (GAUZE/BANDAGES/DRESSINGS) ×1 IMPLANT
GLOVE BIO SURGEON STRL SZ 6.5 (GLOVE) ×1 IMPLANT
GLOVE BIO SURGEON STRL SZ7 (GLOVE) ×1 IMPLANT
GLOVE BIOGEL PI IND STRL 6.5 (GLOVE) IMPLANT
GLOVE BIOGEL PI IND STRL 7.0 (GLOVE) IMPLANT
GLOVE BIOGEL PI IND STRL 7.5 (GLOVE) IMPLANT
GLOVE BIOGEL PI INDICATOR 6.5 (GLOVE) ×1
GLOVE BIOGEL PI INDICATOR 7.0 (GLOVE) ×1
GLOVE BIOGEL PI INDICATOR 7.5 (GLOVE) ×1
GLOVE SURG ORTHO 8.0 STRL STRW (GLOVE) ×2 IMPLANT
GOWN STRL REUS W/TWL LRG LVL3 (GOWN DISPOSABLE) ×1 IMPLANT
GOWN STRL REUS W/TWL XL LVL3 (GOWN DISPOSABLE) ×4 IMPLANT
HEMOSTAT SURGICEL 4X8 (HEMOSTASIS) IMPLANT
KIT BASIN OR (CUSTOM PROCEDURE TRAY) ×2 IMPLANT
POUCH SPECIMEN RETRIEVAL 10MM (ENDOMECHANICALS) ×2 IMPLANT
SCISSORS LAP 5X35 DISP (ENDOMECHANICALS) ×2 IMPLANT
SET CHOLANGIOGRAPH MIX (MISCELLANEOUS) ×2 IMPLANT
SET IRRIG TUBING LAPAROSCOPIC (IRRIGATION / IRRIGATOR) ×2 IMPLANT
SLEEVE XCEL OPT CAN 5 100 (ENDOMECHANICALS) ×3 IMPLANT
SPONGE GAUZE 2X2 STER 10/PKG (GAUZE/BANDAGES/DRESSINGS) ×1
STRIP CLOSURE SKIN 1/2X4 (GAUZE/BANDAGES/DRESSINGS) ×2 IMPLANT
SUT MNCRL AB 4-0 PS2 18 (SUTURE) ×2 IMPLANT
TOWEL OR 17X26 10 PK STRL BLUE (TOWEL DISPOSABLE) ×2 IMPLANT
TOWEL OR NON WOVEN STRL DISP B (DISPOSABLE) ×2 IMPLANT
TRAY LAPAROSCOPIC (CUSTOM PROCEDURE TRAY) ×2 IMPLANT
TROCAR BLADELESS OPT 5 100 (ENDOMECHANICALS) ×2 IMPLANT
TROCAR XCEL BLUNT TIP 100MML (ENDOMECHANICALS) ×2 IMPLANT
TROCAR XCEL NON-BLD 11X100MML (ENDOMECHANICALS) ×3 IMPLANT
TUBING INSUF HEATED (TUBING) ×1 IMPLANT

## 2016-06-15 NOTE — Anesthesia Procedure Notes (Signed)
Procedure Name: Intubation Date/Time: 06/15/2016 7:35 AM Performed by: Lind Covert Pre-anesthesia Checklist: Patient identified, Emergency Drugs available, Suction available, Patient being monitored and Timeout performed Patient Re-evaluated:Patient Re-evaluated prior to inductionOxygen Delivery Method: Circle system utilized Preoxygenation: Pre-oxygenation with 100% oxygen Intubation Type: IV induction Ventilation: Mask ventilation without difficulty Laryngoscope Size: Miller and 2 Grade View: Grade I Tube type: Oral Tube size: 7.5 mm Number of attempts: 1 Airway Equipment and Method: Patient positioned with wedge pillow and Bougie stylet Secured at: 23 cm Tube secured with: Tape Dental Injury: Teeth and Oropharynx as per pre-operative assessment  Future Recommendations: Recommend- induction with short-acting agent, and alternative techniques readily available Comments: Grade 1 view visualized with Miller 2. Used bougie stylet to pass endotrachial tube. Intubation performed by B. Cobb, SRNA.

## 2016-06-15 NOTE — Progress Notes (Signed)
O.K. To go to floor per Dr. Smith Robert

## 2016-06-15 NOTE — Op Note (Signed)
Procedure Note  Pre-operative Diagnosis:  Chronic cholecystitis, cholelithiasis, history of elevated LFT's  Post-operative Diagnosis:  same  Surgeon:  Earnstine Regal, MD, FACS  Assistant:  none   Procedure:  Laparoscopic cholecystectomy with intra-operative cholangiography  Anesthesia:  General  Estimated Blood Loss:  minimal  Drains: none         Specimen: Gallbladder to pathology  Indications:  Patient is referred by Dr. Lavone Orn for management of symptomatic cholelithiasis. Patient sustained a fall on Apr 27, 2016. Evaluation included laboratory studies showing elevated liver function test. Patient underwent an abdominal ultrasound on Apr 28, 2016. This showed multiple small gallstones layering in the gallbladder with gallbladder sludge. Subsequent MRI scan on May 04, 2016 showed multiple gallstones. There was mild biliary dilatation. There was no sign of choledocholithiasis. Patient has had intermittent episodes of right-sided abdominal pain which he describes as stabbing in nature. He has not had true biliary colic. Patient has had previous abdominal surgery with multiple attempts at umbilical hernia repair including laparoscopic repair with mesh.   Procedure Details:  The patient was seen in the pre-op holding area. The risks, benefits, complications, treatment options, and expected outcomes were previously discussed with the patient. The patient agreed with the proposed plan and has signed the informed consent form.  The patient was brought to the Operating Room, identified as Kevin [name] and the procedure verified as laparoscopic cholecystectomy with intraoperative cholangiography. A "time out" was completed and the above information confirmed.  The patient was placed in the supine position. Following induction of general anesthesia, the abdomen was prepped and draped in the usual aseptic fashion.  An incision was made in the skin near the left costal margin. An  Optiview trocar was advanced under direct vision into the peritoneal cavity.  Pneumoperitoneum was established with carbon dioxide. Additional trocars were introduced under direct vision along the right costal margin in the midline, mid-clavicular line, and anterior axillary line.  An 11 mm trocar was placed in the low midline below the anterior abdominal wall mesh under direct vision.   The gallbladder was identified and the fundus grasped and retracted cephalad. Adhesions were taken down bluntly and the electrocautery was utilized as needed, taking care not to injure any adjacent structures. The infundibulum was grasped and retracted laterally, exposing the peritoneum overlying the triangle of Calot. The peritoneum was incised and structures exposed with blunt dissection. The cystic duct was clearly identified, bluntly dissected circumferentially, and clipped at the neck of the gallbladder.  An incision was made in the cystic duct and the cholangiogram catheter introduced. The catheter was secured using an ligaclip.  Real-time cholangiography was performed using C-arm fluoroscopy.  There was rapid filling of a mildly dilated common bile duct.  There was reflux of contrast into the left and right hepatic ductal systems.  There was flow distally into the duodenum without filling defect or obstruction.  Cholangiogram was reviewed intra-operatively by Dr. Larena Glassman.  The catheter was removed from the peritoneal cavity.  The cystic duct was then ligated with surgical clips and divided. The cystic artery was identified, dissected circumferentially, ligated with ligaclips, and divided.  The gallbladder was dissected away from the liver bed using the electrocautery for hemostasis. The gallbladder was completely removed from the liver and placed into an endocatch bag. The gallbladder was removed in the endocatch bag through the sub-xiphoid port site and submitted to pathology for review.  The right upper  quadrant was irrigated and the gallbladder bed  was inspected. Hemostasis was achieved with the electrocautery.  Pneumoperitoneum was released after viewing removal of the trocars with good hemostasis noted.  Local anesthetic was infiltrated at all port sites. The skin incisions were closed with 4-0 Monocril subcuticular sutures and steri-strips and dressings were applied.  Instrument, sponge, and needle counts were correct at the conclusion of the case.  The patient was awakened from anesthesia and brought to the recovery room in stable condition.  The patient tolerated the procedure well.   Earnstine Regal, MD, [facility] Office: [phone]

## 2016-06-15 NOTE — Progress Notes (Signed)
Pt ambulate 70 feet in hall with walker. Pt tolerated well.

## 2016-06-15 NOTE — Transfer of Care (Signed)
Immediate Anesthesia Transfer of Care Note  Patient: Kevin [name]  Procedure(s) Performed: Procedure(s): LAPAROSCOPIC CHOLECYSTECTOMY WITH INTRAOPERATIVE CHOLANGIOGRAM (N/A)  Patient Location: PACU  Anesthesia Type:General  Level of Consciousness: sedated  Airway & Oxygen Therapy: Patient Spontanous Breathing and Patient connected to face mask oxygen  Post-op Assessment: Report given to RN and Post -op Vital signs reviewed and stable  Post vital signs: Reviewed and stable  Last Vitals:  Filed Vitals:   06/15/16 0545  BP: 160/85  Pulse: 112  Temp: 37.1 C  Resp: 18    Last Pain: There were no vitals filed for this visit.    Patients Stated Pain Goal: 4 ([id number])  Complications: No apparent anesthesia complications

## 2016-06-15 NOTE — Interval H&P Note (Signed)
History and Physical Interval Note:  06/15/2016 7:10 AM  Kevin [name]  has presented today for surgery, with the diagnosis of Cholelithiasis, elevated LFTs  The various methods of treatment have been discussed with the patient and family. After consideration of risks, benefits and other options for treatment, the patient has consented to    Procedure(s): LAPAROSCOPIC CHOLECYSTECTOMY WITH INTRAOPERATIVE CHOLANGIOGRAM (N/A) as a surgical intervention .    The patient's history has been reviewed, patient examined, no change in status, stable for surgery.  I have reviewed the patient's chart and labs.  Questions were answered to the patient's satisfaction.    Earnstine Regal, MD, [facility] Office: [place]

## 2016-06-15 NOTE — Anesthesia Postprocedure Evaluation (Signed)
Anesthesia Post Note  Patient: Kento Sattar Sedore  Procedure(s) Performed: Procedure(s) (LRB): LAPAROSCOPIC CHOLECYSTECTOMY WITH INTRAOPERATIVE CHOLANGIOGRAM (N/A)  Patient location during evaluation: PACU Anesthesia Type: General Level of consciousness: awake and alert Pain management: pain level controlled Vital Signs Assessment: post-procedure vital signs reviewed and stable Respiratory status: spontaneous breathing, nonlabored ventilation, respiratory function stable and patient connected to nasal cannula oxygen Cardiovascular status: blood pressure returned to baseline and stable Postop Assessment: no signs of nausea or vomiting Anesthetic complications: no    Last Vitals:  Filed Vitals:   06/15/16 1007 06/15/16 1015  BP:  128/72  Pulse: 88 86  Temp:    Resp: 17 15    Last Pain:  Filed Vitals:   06/15/16 1017  PainSc: [place] Ernie Kasler

## 2016-06-16 ENCOUNTER — Encounter (HOSPITAL_COMMUNITY): Payer: Self-pay | Admitting: Surgery

## 2016-06-16 DIAGNOSIS — K8010 Calculus of gallbladder with chronic cholecystitis without obstruction: Secondary | ICD-10-CM | POA: Diagnosis not present

## 2016-06-16 LAB — GLUCOSE, CAPILLARY
Glucose-Capillary: 265 mg/dL — ABNORMAL HIGH (ref 65–99)
Glucose-Capillary: 296 mg/dL — ABNORMAL HIGH (ref 65–99)

## 2016-06-16 MED ORDER — OXYCODONE HCL 5 MG PO TABS: 5.0000 mg | ORAL_TABLET | ORAL | Status: DC | PRN

## 2016-06-16 NOTE — Progress Notes (Signed)
Pt question evening medication and insulin dosage.  On call provider notified and pt was referred to speak with Dr. Harlow Asa in the morning.  Pt verbalize understanding.  Will continue to monitor.Marland Kitchen

## 2016-06-16 NOTE — Progress Notes (Signed)
PT discharged home with spouse in stable condition. Discharge instructions and script given. Pt and spouse verbalized understanding. Pt voices no questions/concerns at this time.

## 2016-06-16 NOTE — Discharge Summary (Signed)
Physician Discharge Summary [facility]  Patient ID: Kevin [name] MRN: [id number] DOB/AGE: [birth year] 70 y.o.  Admit date: 06/15/2016 Discharge date: 06/16/2016  Admission Diagnoses:  Chronic cholecystitis, cholelithiasis  Discharge Diagnoses:  Principal Problem:   Cholelithiasis with cholecystitis Active Problems:   Cholelithiasis with chronic cholecystitis   Discharged Condition: good  Hospital Course: Patient was admitted for observation following gallbladder surgery.  Post op course was uncomplicated.  Pain was well controlled.  Tolerated diet.  Patient was prepared for discharge home on POD#1.  Consults: None  Treatments: surgery: lap chole with IOC  Discharge Exam: Blood pressure 118/70, pulse 72, temperature 98.4 F (36.9 C), temperature source Oral, resp. rate 22, height 6' (1.829 m), weight 113.853 kg (251 lb), SpO2 93 %. HEENT - clear Neck - soft Chest - clear bilaterally Cor - RRR Abd - soft without distension; dressings dry and intact  Disposition: Home  Discharge Instructions    Diet - low sodium heart healthy    Complete by:  As directed      Discharge instructions    Complete by:  As directed   [facility]  LAPAROSCOPIC SURGERY:  POST-OP INSTRUCTIONS  Always review your discharge instruction sheet given to you by the facility where your surgery was performed.  A prescription for pain medication may be given to you upon discharge.  Take your pain medication as prescribed.  If narcotic pain medicine is not needed, then you may take acetaminophen (Tylenol) or ibuprofen (Advil) as needed.  Take your usually prescribed medications unless otherwise directed.  If you need a refill on your pain medication, please contact your pharmacy.  They will contact our office to request authorization. Prescriptions will not be filled after 5 P.M. or on weekends.  You should follow a light diet the first few days after arrival  home, such as soup and crackers or toast.  Be sure to include plenty of fluids daily.  Most patients will experience some swelling and bruising in the area of the incisions.  Ice packs will help.  Swelling and bruising can take several days to resolve.   It is common to experience some constipation after surgery.  Increasing fluid intake and taking a stool softener (such as Colace) will usually help or prevent this problem from occurring.  A mild laxative (Milk of Magnesia or Miralax) should be taken according to package instructions if there has been no bowel movement after 48 hours.  You will have steri-strips and a gauze dressing over your incisions.  You may remove the gauze bandage on the second day after surgery, and you may shower at that time.  Leave your steri-strips (small skin tapes) in place directly over the incision.  These strips should remain on the skin for 5-7 days and then be removed.  You may get them wet in the shower and pat them dry.  Any sutures or staples will be removed at the office during your follow-up visit.  ACTIVITIES:  You may resume regular (light) daily activities beginning the next day - such as daily self-care, walking, climbing stairs - gradually increasing activities as tolerated.  You may have sexual intercourse when it is comfortable.  Refrain from any heavy lifting or straining until approved by your doctor.  You may drive when you are no longer taking prescription pain medication, you can comfortably wear a seatbelt, and you can safely maneuver your car and apply brakes.  You should see your doctor in the  office for a follow-up appointment approximately 2-3 weeks after your surgery.  Make sure that you call for this appointment within a day or two after you arrive home to insure a convenient appointment time.  WHEN TO CALL YOUR DOCTOR: Fever over 101.0 Inability to urinate Continued bleeding from incision Increased pain, redness, or drainage from the  incision Increasing abdominal pain  The clinic staff is available to answer your questions during regular business hours.  Please don't hesitate to call and ask to speak to one of the nurses for clinical concerns.  If you have a medical emergency, go to the nearest emergency room or call 911.  A surgeon from [facility] is always on call for the hospital.  Earnstine Regal, MD, [facility] Office: [place] Free:  [place] [phone]  Website: [email or web address]     Increase activity slowly    Complete by:  As directed      Remove dressing in 24 hours    Complete by:  As directed             Medication List    TAKE these medications        ACAI PO  Take 1 tablet by mouth 3 (three) times a week. Alternate with Turmeric and Resveratrol     amLODipine 10 MG tablet  Commonly known as:  NORVASC  Take 10 mg by mouth daily.     aspirin 325 MG tablet  Take 325 mg by mouth every evening.     atorvastatin 80 MG tablet  Commonly known as:  LIPITOR  Take 80 mg by mouth at bedtime.     furosemide 40 MG tablet  Commonly known as:  LASIX  Take 40 mg by mouth daily as needed for fluid.     ibuprofen 200 MG tablet  Commonly known as:  ADVIL,MOTRIN  Take 200 mg by mouth every 12 (twelve) hours as needed for moderate pain.     insulin lispro 100 UNIT/ML injection  Commonly known as:  HUMALOG  Inject 1-40 Units into the skin 3 (three) times daily with meals. Patient to give 1 unit of Humalog for every 10mg /dl increase in CBG greater than 100mg /dl     losartan 100 MG tablet  Commonly known as:  COZAAR  Take 100 mg by mouth daily.     metFORMIN 1000 MG tablet  Commonly known as:  GLUCOPHAGE  Take 1,000 mg by mouth 2 (two) times daily with a meal.     naproxen sodium 220 MG tablet  Commonly known as:  ANAPROX  Take 220 mg by mouth every 12 (twelve) hours as needed (pain).     NOVOLIN 70/30 (70-30) 100 UNIT/ML  injection  Generic drug:  insulin NPH-regular Human  Inject 20-50 Units into the skin 2 (two) times daily with a meal. 50 units with breakfast and 20 units with evening meal     omeprazole 40 MG capsule  Commonly known as:  PRILOSEC  Take 40 mg by mouth daily.     oxyCODONE 5 MG immediate release tablet  Commonly known as:  ROXICODONE  Take 1 tablet (5 mg total) by mouth every 4 (four) hours as needed for severe pain.     oxyCODONE 5 MG immediate release tablet  Commonly known as:  Oxy IR/ROXICODONE  Take 1-2 tablets (5-10 mg total) by mouth every 4 (four) hours as needed for moderate pain.     potassium chloride SA 20 MEQ tablet  Commonly known as:  K-DUR,KLOR-CON  Take 20 mEq by mouth 2 (two) times daily as needed (low potassium).     Resveratrol 100 MG Caps  Take 1 capsule by mouth 3 (three) times a week. Alternate with Acai and Turmeric     tadalafil 5 MG tablet  Commonly known as:  CIALIS  Take 5 mg by mouth daily as needed for erectile dysfunction.     TURMERIC PO  Take 1 tablet by mouth 3 (three) times a week. Alternate with Acai and Resveratrol     WOMENS MULTIVITAMIN PO  Take 1 tablet by mouth daily.           Follow-up Information    Follow up with Earnstine Regal, MD. Schedule an appointment as soon as possible for a visit in 3 weeks.   Specialty:  General Surgery   Why:  For wound re-check   Contact information:   [place] [address] [phone]       Earnstine Regal, MD, [facility] Office: [phone]   Signed: Earnstine Regal 06/16/2016, 8:34 AM

## 2016-12-29 DIAGNOSIS — R69 Illness, unspecified: Secondary | ICD-10-CM | POA: Diagnosis not present

## 2017-01-12 DIAGNOSIS — R350 Frequency of micturition: Secondary | ICD-10-CM | POA: Diagnosis not present

## 2017-01-16 DIAGNOSIS — N390 Urinary tract infection, site not specified: Secondary | ICD-10-CM | POA: Diagnosis not present

## 2017-01-16 DIAGNOSIS — G4733 Obstructive sleep apnea (adult) (pediatric): Secondary | ICD-10-CM | POA: Diagnosis not present

## 2017-01-16 DIAGNOSIS — M4620 Osteomyelitis of vertebra, site unspecified: Secondary | ICD-10-CM | POA: Diagnosis not present

## 2017-01-16 DIAGNOSIS — M5137 Other intervertebral disc degeneration, lumbosacral region: Secondary | ICD-10-CM | POA: Diagnosis not present

## 2017-02-14 DIAGNOSIS — Z7982 Long term (current) use of aspirin: Secondary | ICD-10-CM | POA: Diagnosis not present

## 2017-02-14 DIAGNOSIS — E876 Hypokalemia: Secondary | ICD-10-CM | POA: Diagnosis not present

## 2017-02-14 DIAGNOSIS — E1142 Type 2 diabetes mellitus with diabetic polyneuropathy: Secondary | ICD-10-CM | POA: Diagnosis not present

## 2017-02-14 DIAGNOSIS — Z794 Long term (current) use of insulin: Secondary | ICD-10-CM | POA: Diagnosis not present

## 2017-02-14 DIAGNOSIS — Z0000 Encounter for general adult medical examination without abnormal findings: Secondary | ICD-10-CM | POA: Diagnosis not present

## 2017-02-14 DIAGNOSIS — Z791 Long term (current) use of non-steroidal anti-inflammatories (NSAID): Secondary | ICD-10-CM | POA: Diagnosis not present

## 2017-02-14 DIAGNOSIS — R600 Localized edema: Secondary | ICD-10-CM | POA: Diagnosis not present

## 2017-02-14 DIAGNOSIS — M47816 Spondylosis without myelopathy or radiculopathy, lumbar region: Secondary | ICD-10-CM | POA: Diagnosis not present

## 2017-02-14 DIAGNOSIS — K219 Gastro-esophageal reflux disease without esophagitis: Secondary | ICD-10-CM | POA: Diagnosis not present

## 2017-02-14 DIAGNOSIS — I10 Essential (primary) hypertension: Secondary | ICD-10-CM | POA: Diagnosis not present

## 2017-02-14 DIAGNOSIS — Z87891 Personal history of nicotine dependence: Secondary | ICD-10-CM | POA: Diagnosis not present

## 2017-02-14 DIAGNOSIS — R251 Tremor, unspecified: Secondary | ICD-10-CM | POA: Diagnosis not present

## 2017-02-14 DIAGNOSIS — Z79891 Long term (current) use of opiate analgesic: Secondary | ICD-10-CM | POA: Diagnosis not present

## 2017-02-14 DIAGNOSIS — E669 Obesity, unspecified: Secondary | ICD-10-CM | POA: Diagnosis not present

## 2017-02-14 DIAGNOSIS — Z7984 Long term (current) use of oral hypoglycemic drugs: Secondary | ICD-10-CM | POA: Diagnosis not present

## 2017-02-14 DIAGNOSIS — E7800 Pure hypercholesterolemia, unspecified: Secondary | ICD-10-CM | POA: Diagnosis not present

## 2017-02-16 DIAGNOSIS — G4733 Obstructive sleep apnea (adult) (pediatric): Secondary | ICD-10-CM | POA: Diagnosis not present

## 2017-02-16 DIAGNOSIS — N390 Urinary tract infection, site not specified: Secondary | ICD-10-CM | POA: Diagnosis not present

## 2017-02-16 DIAGNOSIS — M4620 Osteomyelitis of vertebra, site unspecified: Secondary | ICD-10-CM | POA: Diagnosis not present

## 2017-02-16 DIAGNOSIS — M5137 Other intervertebral disc degeneration, lumbosacral region: Secondary | ICD-10-CM | POA: Diagnosis not present

## 2017-02-27 DIAGNOSIS — M4620 Osteomyelitis of vertebra, site unspecified: Secondary | ICD-10-CM | POA: Diagnosis not present

## 2017-02-27 DIAGNOSIS — G4733 Obstructive sleep apnea (adult) (pediatric): Secondary | ICD-10-CM | POA: Diagnosis not present

## 2017-02-27 DIAGNOSIS — M5137 Other intervertebral disc degeneration, lumbosacral region: Secondary | ICD-10-CM | POA: Diagnosis not present

## 2017-02-27 DIAGNOSIS — N390 Urinary tract infection, site not specified: Secondary | ICD-10-CM | POA: Diagnosis not present

## 2017-03-13 DIAGNOSIS — Z6833 Body mass index (BMI) 33.0-33.9, adult: Secondary | ICD-10-CM | POA: Diagnosis not present

## 2017-03-13 DIAGNOSIS — M961 Postlaminectomy syndrome, not elsewhere classified: Secondary | ICD-10-CM | POA: Diagnosis not present

## 2017-03-13 DIAGNOSIS — I10 Essential (primary) hypertension: Secondary | ICD-10-CM | POA: Diagnosis not present

## 2017-03-13 DIAGNOSIS — M545 Low back pain: Secondary | ICD-10-CM | POA: Diagnosis not present

## 2017-03-16 DIAGNOSIS — M4620 Osteomyelitis of vertebra, site unspecified: Secondary | ICD-10-CM | POA: Diagnosis not present

## 2017-03-16 DIAGNOSIS — M5137 Other intervertebral disc degeneration, lumbosacral region: Secondary | ICD-10-CM | POA: Diagnosis not present

## 2017-03-16 DIAGNOSIS — G4733 Obstructive sleep apnea (adult) (pediatric): Secondary | ICD-10-CM | POA: Diagnosis not present

## 2017-03-16 DIAGNOSIS — N390 Urinary tract infection, site not specified: Secondary | ICD-10-CM | POA: Diagnosis not present

## 2017-03-21 DIAGNOSIS — R69 Illness, unspecified: Secondary | ICD-10-CM | POA: Diagnosis not present

## 2017-04-06 DIAGNOSIS — E1165 Type 2 diabetes mellitus with hyperglycemia: Secondary | ICD-10-CM | POA: Diagnosis not present

## 2017-04-06 DIAGNOSIS — M48061 Spinal stenosis, lumbar region without neurogenic claudication: Secondary | ICD-10-CM | POA: Diagnosis not present

## 2017-04-06 DIAGNOSIS — E1142 Type 2 diabetes mellitus with diabetic polyneuropathy: Secondary | ICD-10-CM | POA: Diagnosis not present

## 2017-04-06 DIAGNOSIS — Z794 Long term (current) use of insulin: Secondary | ICD-10-CM | POA: Diagnosis not present

## 2017-04-06 DIAGNOSIS — K219 Gastro-esophageal reflux disease without esophagitis: Secondary | ICD-10-CM | POA: Diagnosis not present

## 2017-04-06 DIAGNOSIS — Z5181 Encounter for therapeutic drug level monitoring: Secondary | ICD-10-CM | POA: Diagnosis not present

## 2017-04-06 DIAGNOSIS — I10 Essential (primary) hypertension: Secondary | ICD-10-CM | POA: Diagnosis not present

## 2017-04-16 DIAGNOSIS — M4620 Osteomyelitis of vertebra, site unspecified: Secondary | ICD-10-CM | POA: Diagnosis not present

## 2017-04-16 DIAGNOSIS — N390 Urinary tract infection, site not specified: Secondary | ICD-10-CM | POA: Diagnosis not present

## 2017-04-16 DIAGNOSIS — G4733 Obstructive sleep apnea (adult) (pediatric): Secondary | ICD-10-CM | POA: Diagnosis not present

## 2017-04-16 DIAGNOSIS — M5137 Other intervertebral disc degeneration, lumbosacral region: Secondary | ICD-10-CM | POA: Diagnosis not present

## 2017-05-05 IMAGING — US US ABDOMEN COMPLETE
1 series · 13 of 25 positions shown · non-contrast
Comparison: CT abdomen and pelvis [DATE].  No prior ultrasound.

CLINICAL DATA: 69-year-old presenting with a 2 week history of
right upper quadrant abdominal pain. Current history of
hypertension, diabetes and hypercholesterolemia.

EXAM:
ABDOMEN ULTRASOUND COMPLETE

[Series 1: us abdomen complete · 0.30mm/px · 13 of 80 slices shown]
[im 1/80]
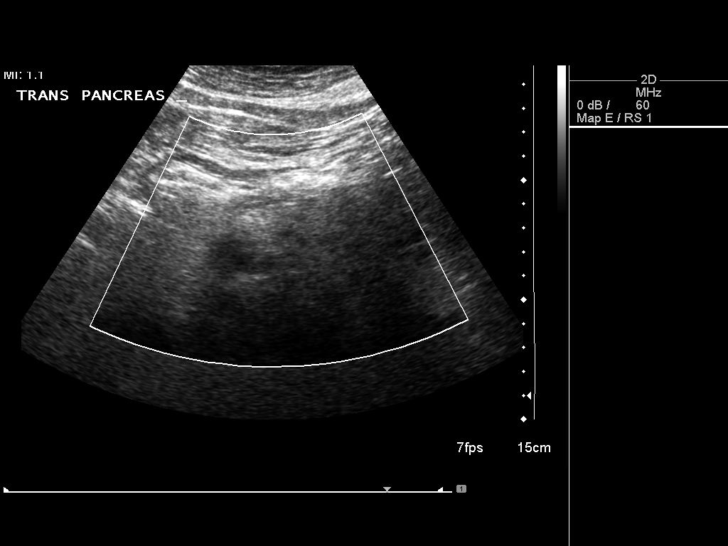
[im 7/80]
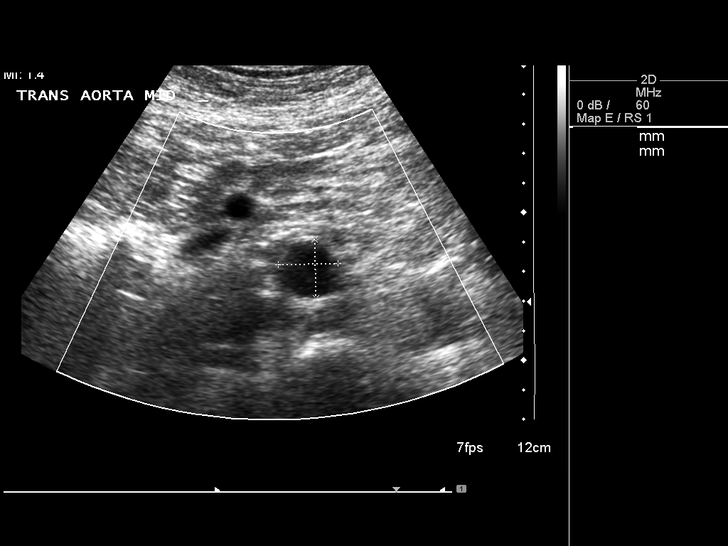
[im 14/80]
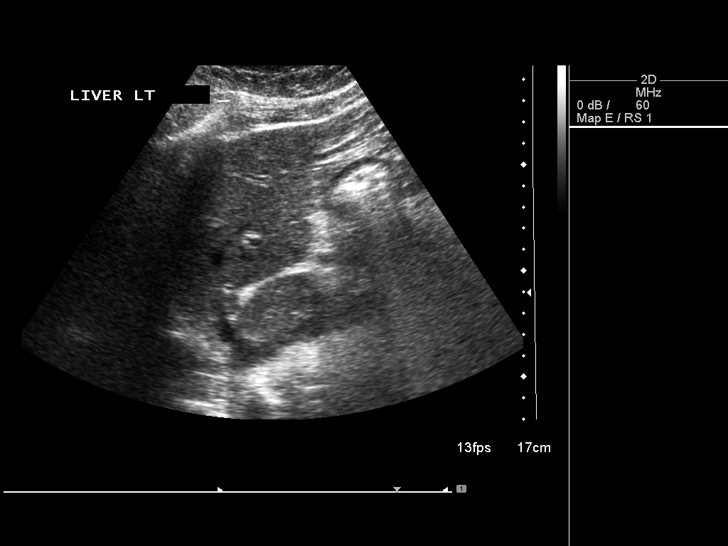
[im 20/80]
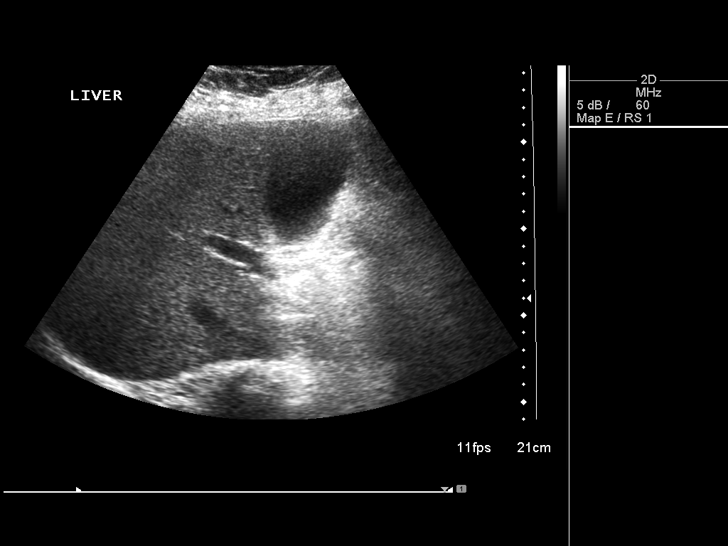
[im 27/80]
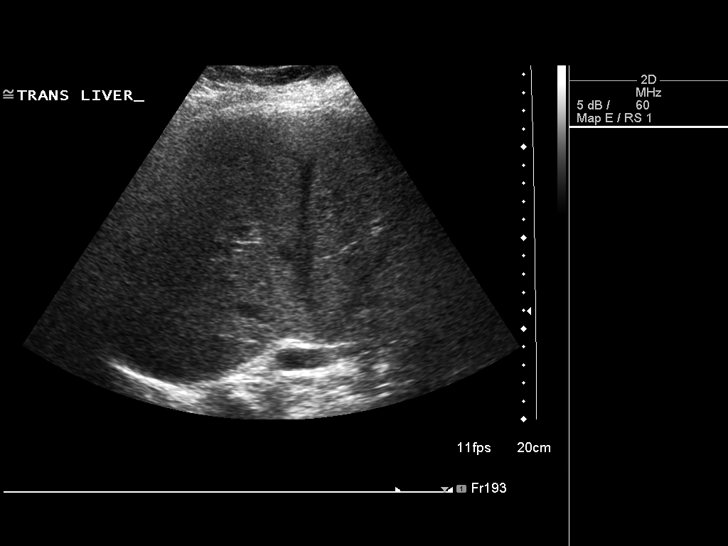
[im 33/80]
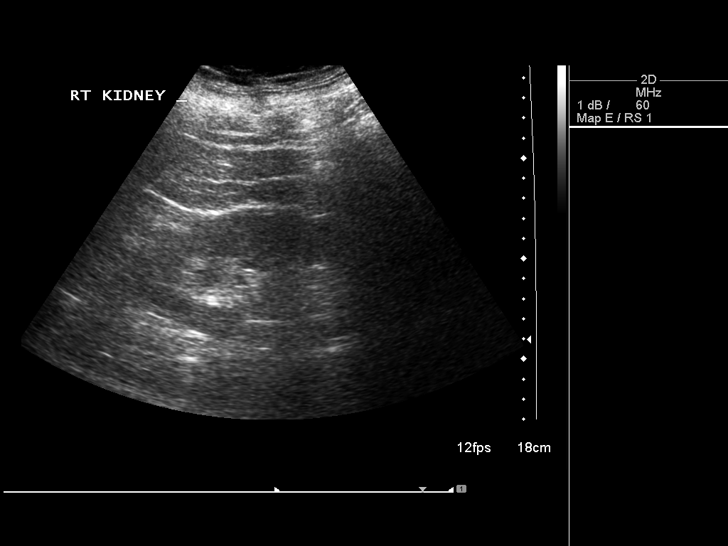
[im 40/80]
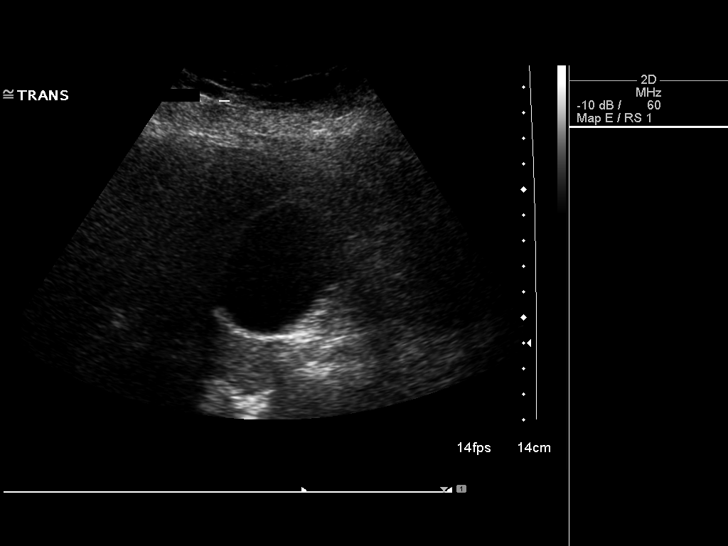
[im 47/80]
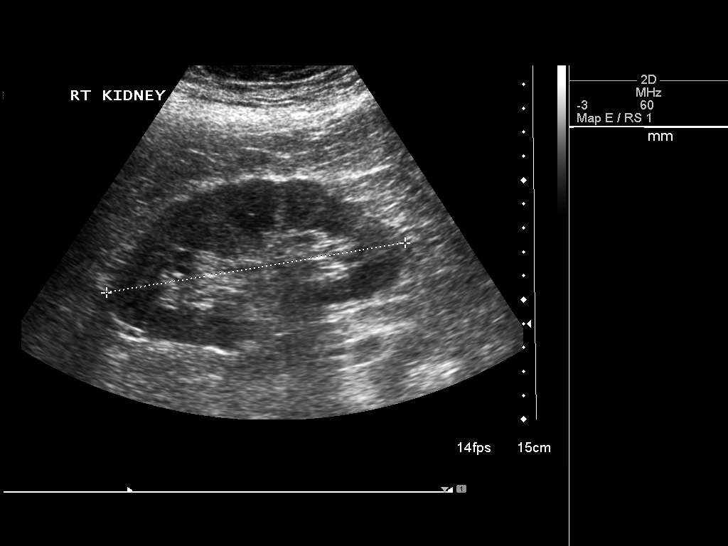
[im 53/80]
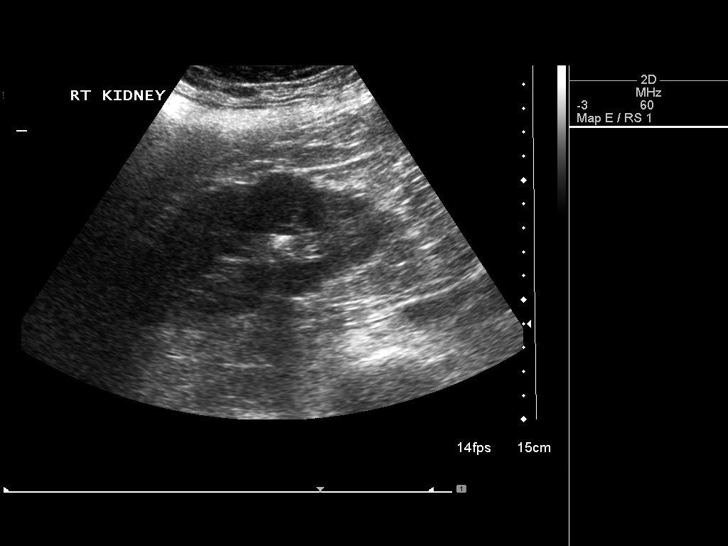
[im 60/80]
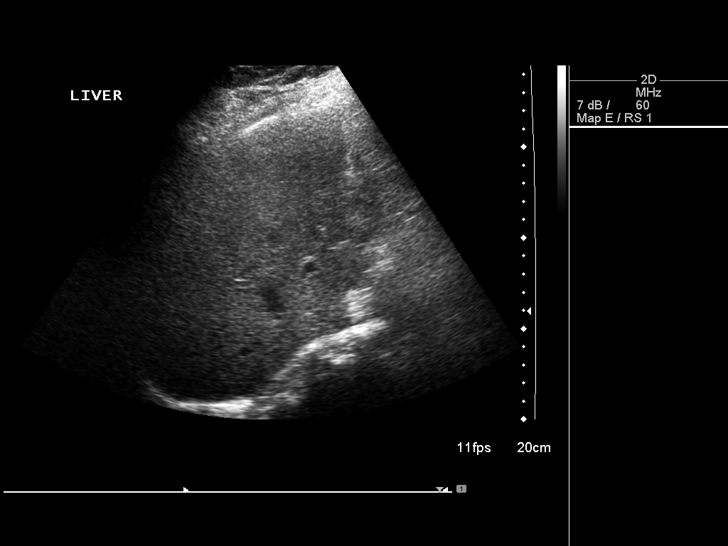
[im 66/80]
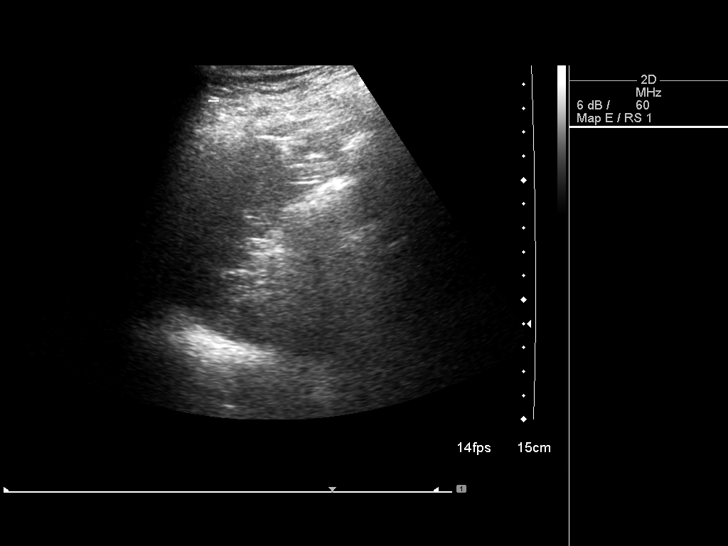
[im 73/80]
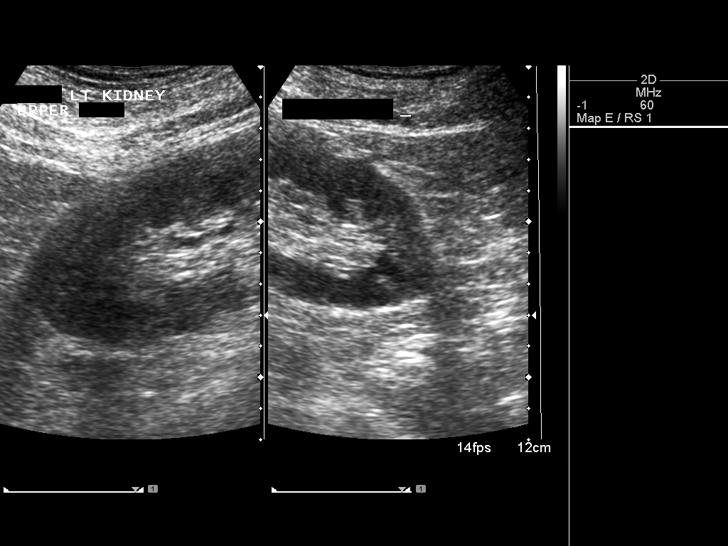
[im 80/80]
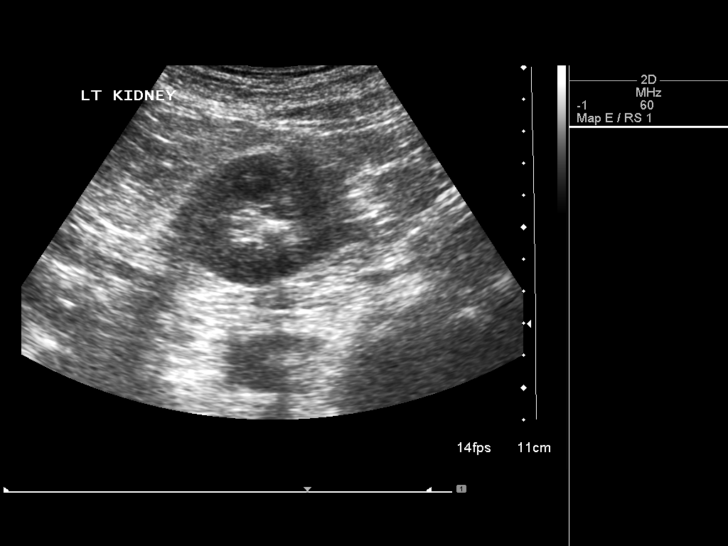

[13 of 25 positions shown; findings below may reference images not displayed]

FINDINGS: Gallbladder: No shadowing gallstones or echogenic sludge. No
gallbladder wall thickening or pericholecystic fluid. Negative
sonographic Murphy sign according to the ultrasound technologist.

Common bile duct: Diameter: Approximately 4 mm.

Liver: Mildly increased and coarsened echotexture diffusely without
focal hepatic parenchymal abnormality. Patent portal vein with
hepatopetal flow.

IVC: Patent in its intrahepatic portion. Not visualize outside the
liver due to midline bowel gas.

Pancreas: Suboptimally imaged due to midline bowel gas.

Spleen: Normal size and echotexture without focal parenchymal
abnormality.

Right Kidney: Length: Approximately 12.7 cm. No hydronephrosis.
Well-preserved cortex. Approximate 5 mm calculus in a mid calix.
Normal parenchymal echotexture. No focal parenchymal abnormality.

Left Kidney: Length: Approximately 12.5 cm. No hydronephrosis.
Well-preserved cortex. Approximate 5 mm calculus in a lower pole
calix. Normal parenchymal echotexture. No focal parenchymal
abnormality.

Abdominal aorta: Normal in caliber throughout its visualized course
in the abdomen, though it is obscured distally at the bifurcation
due to midline bowel gas.

Other findings: None.
IMPRESSION: 1. Mild diffuse hepatic steatosis without focal hepatic parenchymal
abnormality.
2. Nonobstructing 5 mm calculus in a mid calix of the right kidney
and nonobstructing 5 mm calculus in a lower pole calix of the left
kidney.
3. No significant abnormality otherwise with a caveat that the
extrahepatic IVC, pancreas, and distal abdominal aorta were obscured
by midline bowel gas and were therefore not evaluated.

## 2017-05-05 IMAGING — CR DG CHEST 2V
2 series · 2 of 2 positions shown · non-contrast
Comparison: 08/31/2012 and earlier.

CLINICAL DATA: 69-year-old with approximate 1 week history of fever
and cough. Current history of diabetes and hypertension.

EXAM:
CHEST  2 VIEW

[w chest pa]
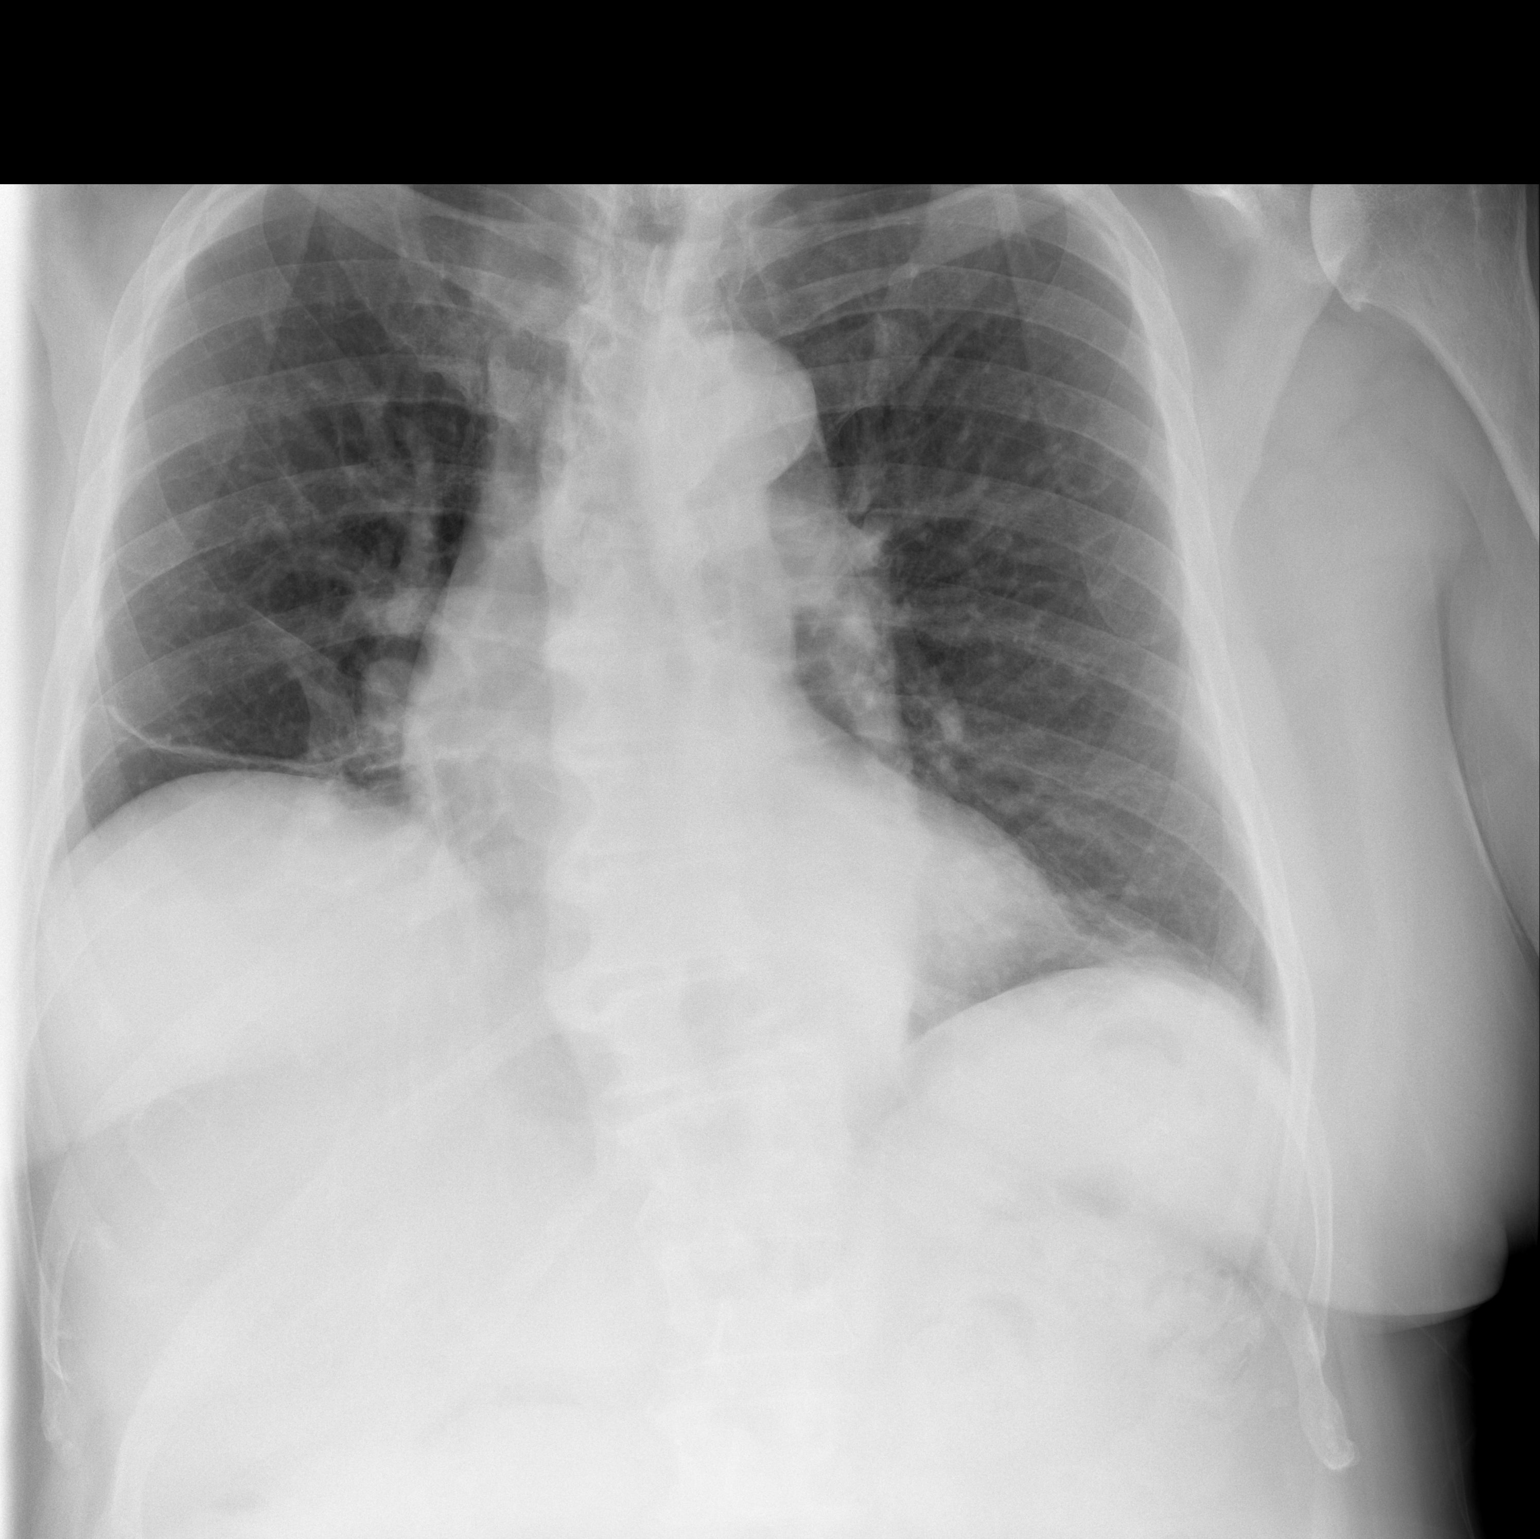

[w chest lat]
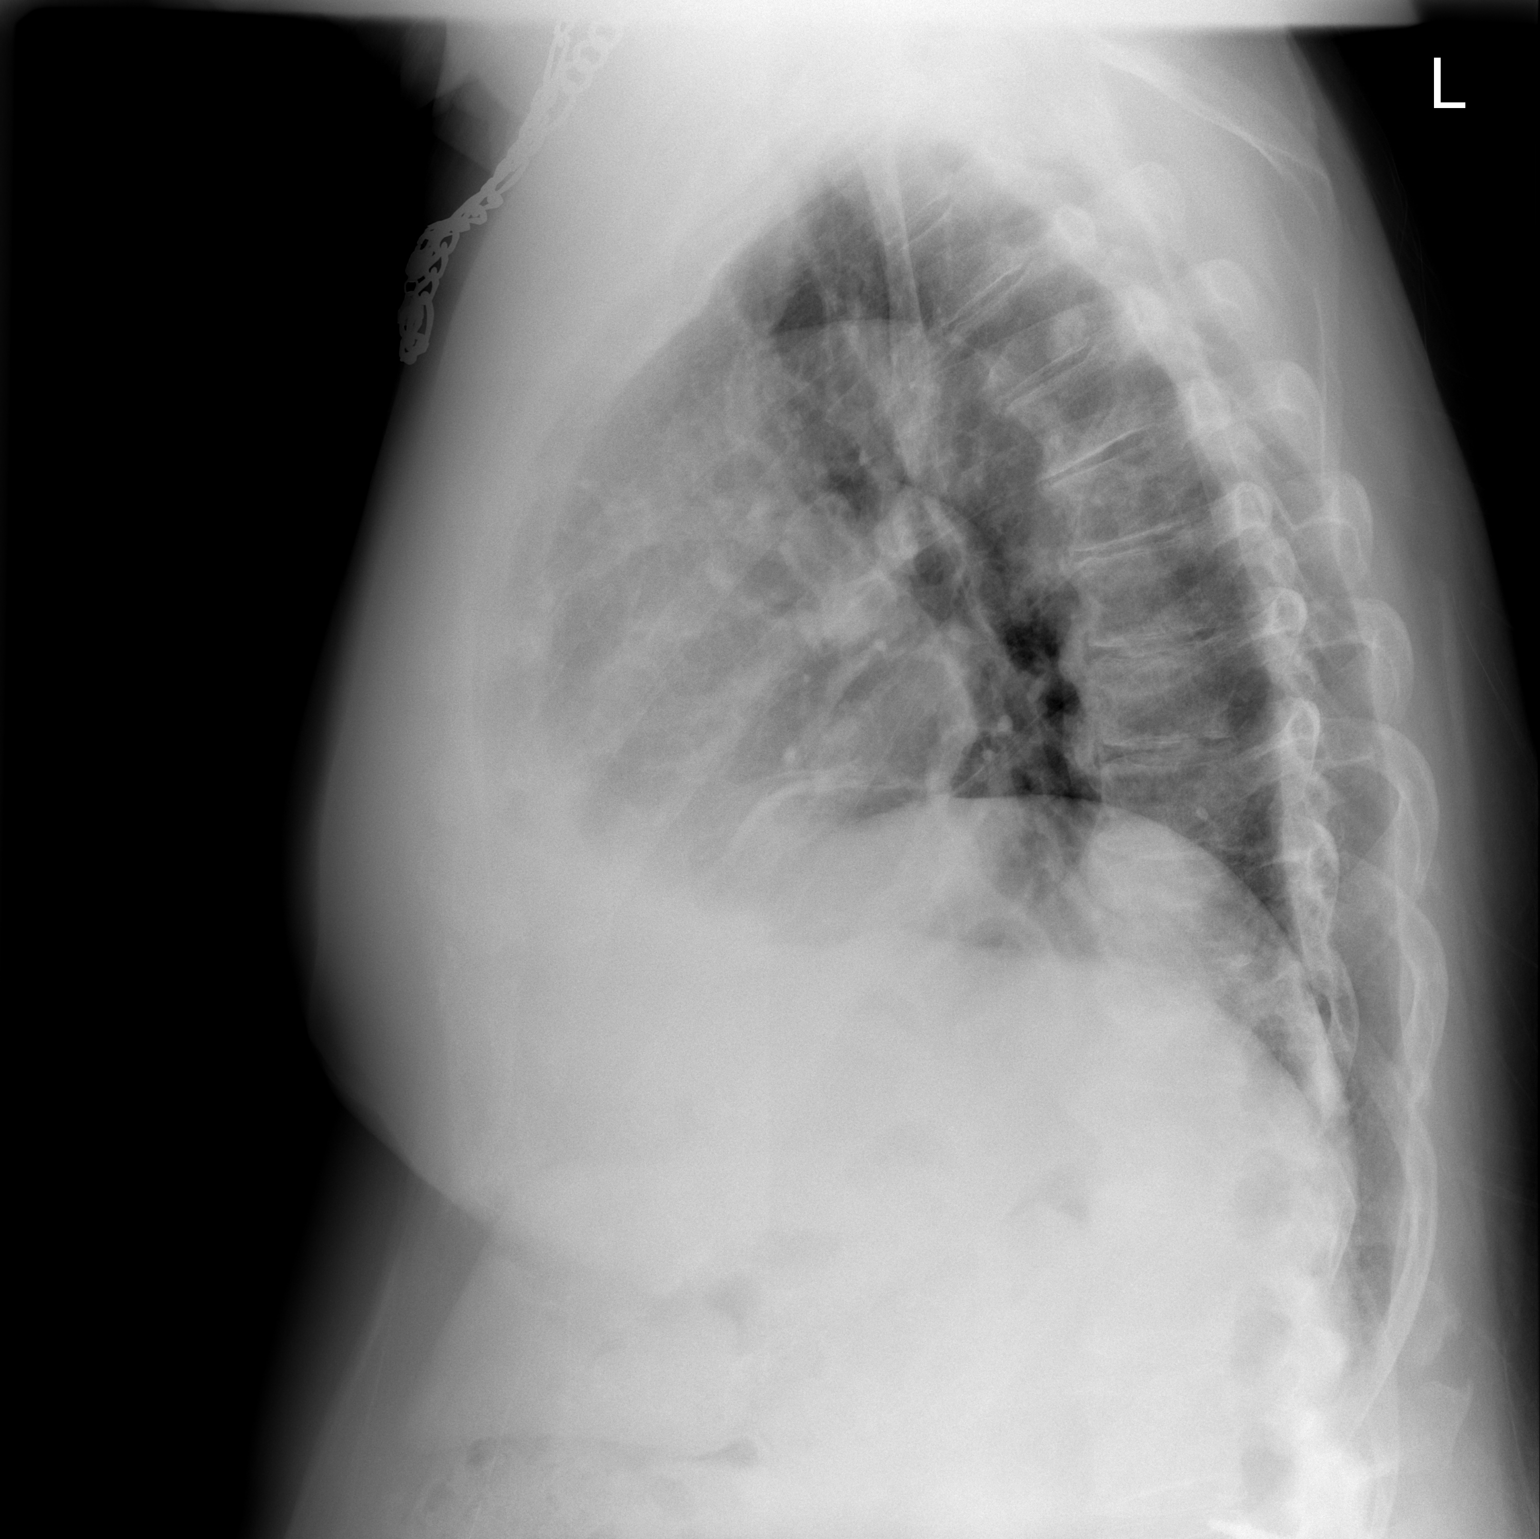

[2 of 2 positions shown; findings below may reference images not displayed]

FINDINGS: Suboptimal inspiration accounts for crowded bronchovascular
markings, especially in the bases, and accentuates the cardiac
silhouette. Taking this into account, cardiac silhouette mildly
enlarged but stable. Thoracic aorta mildly tortuous and
atherosclerotic, unchanged. Hilar and mediastinal contours otherwise
unremarkable. Chronic elevation the right hemidiaphragm and chronic
scar/atelectasis in the right lower lobe and right middle lobe.
Stable chronic scarring in the left lower lobe. Lungs otherwise
clear. No localized airspace consolidation. No pleural effusions. No
pneumothorax. Normal pulmonary vascularity. Degenerative changes and
DISH involving the thoracic spine.
IMPRESSION: Suboptimal inspiration. No acute cardiopulmonary disease. Stable
scarring in the lower lobes and right middle lobe. Stable
cardiomegaly without pulmonary edema.

## 2017-05-16 DIAGNOSIS — N390 Urinary tract infection, site not specified: Secondary | ICD-10-CM | POA: Diagnosis not present

## 2017-05-16 DIAGNOSIS — G4733 Obstructive sleep apnea (adult) (pediatric): Secondary | ICD-10-CM | POA: Diagnosis not present

## 2017-05-16 DIAGNOSIS — M5137 Other intervertebral disc degeneration, lumbosacral region: Secondary | ICD-10-CM | POA: Diagnosis not present

## 2017-05-16 DIAGNOSIS — M4620 Osteomyelitis of vertebra, site unspecified: Secondary | ICD-10-CM | POA: Diagnosis not present

## 2017-05-24 DIAGNOSIS — M545 Low back pain: Secondary | ICD-10-CM | POA: Diagnosis not present

## 2017-06-06 DIAGNOSIS — M4620 Osteomyelitis of vertebra, site unspecified: Secondary | ICD-10-CM | POA: Diagnosis not present

## 2017-06-06 DIAGNOSIS — G4733 Obstructive sleep apnea (adult) (pediatric): Secondary | ICD-10-CM | POA: Diagnosis not present

## 2017-06-06 DIAGNOSIS — M5137 Other intervertebral disc degeneration, lumbosacral region: Secondary | ICD-10-CM | POA: Diagnosis not present

## 2017-06-06 DIAGNOSIS — N390 Urinary tract infection, site not specified: Secondary | ICD-10-CM | POA: Diagnosis not present

## 2017-06-12 DIAGNOSIS — M545 Low back pain: Secondary | ICD-10-CM | POA: Diagnosis not present

## 2017-06-12 DIAGNOSIS — I10 Essential (primary) hypertension: Secondary | ICD-10-CM | POA: Diagnosis not present

## 2017-06-12 DIAGNOSIS — Z6833 Body mass index (BMI) 33.0-33.9, adult: Secondary | ICD-10-CM | POA: Diagnosis not present

## 2017-06-12 DIAGNOSIS — M961 Postlaminectomy syndrome, not elsewhere classified: Secondary | ICD-10-CM | POA: Diagnosis not present

## 2017-06-16 DIAGNOSIS — N390 Urinary tract infection, site not specified: Secondary | ICD-10-CM | POA: Diagnosis not present

## 2017-06-16 DIAGNOSIS — G4733 Obstructive sleep apnea (adult) (pediatric): Secondary | ICD-10-CM | POA: Diagnosis not present

## 2017-06-16 DIAGNOSIS — M5137 Other intervertebral disc degeneration, lumbosacral region: Secondary | ICD-10-CM | POA: Diagnosis not present

## 2017-06-16 DIAGNOSIS — M4620 Osteomyelitis of vertebra, site unspecified: Secondary | ICD-10-CM | POA: Diagnosis not present

## 2017-07-16 DIAGNOSIS — M5137 Other intervertebral disc degeneration, lumbosacral region: Secondary | ICD-10-CM | POA: Diagnosis not present

## 2017-07-16 DIAGNOSIS — G4733 Obstructive sleep apnea (adult) (pediatric): Secondary | ICD-10-CM | POA: Diagnosis not present

## 2017-07-16 DIAGNOSIS — N390 Urinary tract infection, site not specified: Secondary | ICD-10-CM | POA: Diagnosis not present

## 2017-07-16 DIAGNOSIS — M4620 Osteomyelitis of vertebra, site unspecified: Secondary | ICD-10-CM | POA: Diagnosis not present

## 2017-07-17 DIAGNOSIS — D2312 Other benign neoplasm of skin of left eyelid, including canthus: Secondary | ICD-10-CM | POA: Diagnosis not present

## 2017-07-17 DIAGNOSIS — E119 Type 2 diabetes mellitus without complications: Secondary | ICD-10-CM | POA: Diagnosis not present

## 2017-07-17 DIAGNOSIS — Z961 Presence of intraocular lens: Secondary | ICD-10-CM | POA: Diagnosis not present

## 2017-07-17 DIAGNOSIS — H02821 Cysts of right upper eyelid: Secondary | ICD-10-CM | POA: Diagnosis not present

## 2017-07-17 DIAGNOSIS — H04123 Dry eye syndrome of bilateral lacrimal glands: Secondary | ICD-10-CM | POA: Diagnosis not present

## 2017-07-20 DIAGNOSIS — R69 Illness, unspecified: Secondary | ICD-10-CM | POA: Diagnosis not present

## 2017-09-07 DIAGNOSIS — M4620 Osteomyelitis of vertebra, site unspecified: Secondary | ICD-10-CM | POA: Diagnosis not present

## 2017-09-07 DIAGNOSIS — N390 Urinary tract infection, site not specified: Secondary | ICD-10-CM | POA: Diagnosis not present

## 2017-09-07 DIAGNOSIS — G4733 Obstructive sleep apnea (adult) (pediatric): Secondary | ICD-10-CM | POA: Diagnosis not present

## 2017-09-07 DIAGNOSIS — M5137 Other intervertebral disc degeneration, lumbosacral region: Secondary | ICD-10-CM | POA: Diagnosis not present

## 2017-09-11 DIAGNOSIS — M545 Low back pain: Secondary | ICD-10-CM | POA: Diagnosis not present

## 2017-09-11 DIAGNOSIS — M961 Postlaminectomy syndrome, not elsewhere classified: Secondary | ICD-10-CM | POA: Diagnosis not present

## 2017-09-26 DIAGNOSIS — R69 Illness, unspecified: Secondary | ICD-10-CM | POA: Diagnosis not present

## 2017-10-05 DIAGNOSIS — R350 Frequency of micturition: Secondary | ICD-10-CM | POA: Diagnosis not present

## 2017-10-08 DIAGNOSIS — R69 Illness, unspecified: Secondary | ICD-10-CM | POA: Diagnosis not present

## 2017-10-10 DIAGNOSIS — R69 Illness, unspecified: Secondary | ICD-10-CM | POA: Diagnosis not present

## 2017-10-12 DIAGNOSIS — E1142 Type 2 diabetes mellitus with diabetic polyneuropathy: Secondary | ICD-10-CM | POA: Diagnosis not present

## 2017-10-12 DIAGNOSIS — Z5181 Encounter for therapeutic drug level monitoring: Secondary | ICD-10-CM | POA: Diagnosis not present

## 2017-10-12 DIAGNOSIS — Z794 Long term (current) use of insulin: Secondary | ICD-10-CM | POA: Diagnosis not present

## 2017-10-12 DIAGNOSIS — Z79899 Other long term (current) drug therapy: Secondary | ICD-10-CM | POA: Diagnosis not present

## 2017-10-19 DIAGNOSIS — N400 Enlarged prostate without lower urinary tract symptoms: Secondary | ICD-10-CM | POA: Diagnosis not present

## 2017-10-19 DIAGNOSIS — Z1389 Encounter for screening for other disorder: Secondary | ICD-10-CM | POA: Diagnosis not present

## 2017-10-19 DIAGNOSIS — I10 Essential (primary) hypertension: Secondary | ICD-10-CM | POA: Diagnosis not present

## 2017-10-19 DIAGNOSIS — G4733 Obstructive sleep apnea (adult) (pediatric): Secondary | ICD-10-CM | POA: Diagnosis not present

## 2017-10-19 DIAGNOSIS — K219 Gastro-esophageal reflux disease without esophagitis: Secondary | ICD-10-CM | POA: Diagnosis not present

## 2017-10-19 DIAGNOSIS — G250 Essential tremor: Secondary | ICD-10-CM | POA: Diagnosis not present

## 2017-10-19 DIAGNOSIS — Z0000 Encounter for general adult medical examination without abnormal findings: Secondary | ICD-10-CM | POA: Diagnosis not present

## 2017-10-19 DIAGNOSIS — M545 Low back pain: Secondary | ICD-10-CM | POA: Diagnosis not present

## 2017-10-29 DIAGNOSIS — M545 Low back pain: Secondary | ICD-10-CM | POA: Diagnosis not present

## 2017-10-29 DIAGNOSIS — M961 Postlaminectomy syndrome, not elsewhere classified: Secondary | ICD-10-CM | POA: Diagnosis not present

## 2017-11-21 DIAGNOSIS — M545 Low back pain: Secondary | ICD-10-CM | POA: Diagnosis not present

## 2017-11-21 DIAGNOSIS — I10 Essential (primary) hypertension: Secondary | ICD-10-CM | POA: Diagnosis not present

## 2017-11-21 DIAGNOSIS — Z6833 Body mass index (BMI) 33.0-33.9, adult: Secondary | ICD-10-CM | POA: Diagnosis not present

## 2017-11-21 DIAGNOSIS — M961 Postlaminectomy syndrome, not elsewhere classified: Secondary | ICD-10-CM | POA: Diagnosis not present

## 2017-12-13 DIAGNOSIS — M5137 Other intervertebral disc degeneration, lumbosacral region: Secondary | ICD-10-CM | POA: Diagnosis not present

## 2017-12-13 DIAGNOSIS — M4620 Osteomyelitis of vertebra, site unspecified: Secondary | ICD-10-CM | POA: Diagnosis not present

## 2017-12-13 DIAGNOSIS — N390 Urinary tract infection, site not specified: Secondary | ICD-10-CM | POA: Diagnosis not present

## 2017-12-13 DIAGNOSIS — G4733 Obstructive sleep apnea (adult) (pediatric): Secondary | ICD-10-CM | POA: Diagnosis not present

## 2017-12-31 DIAGNOSIS — G8929 Other chronic pain: Secondary | ICD-10-CM | POA: Diagnosis not present

## 2017-12-31 DIAGNOSIS — I10 Essential (primary) hypertension: Secondary | ICD-10-CM | POA: Diagnosis not present

## 2017-12-31 DIAGNOSIS — K219 Gastro-esophageal reflux disease without esophagitis: Secondary | ICD-10-CM | POA: Diagnosis not present

## 2017-12-31 DIAGNOSIS — E1140 Type 2 diabetes mellitus with diabetic neuropathy, unspecified: Secondary | ICD-10-CM | POA: Diagnosis not present

## 2017-12-31 DIAGNOSIS — Z794 Long term (current) use of insulin: Secondary | ICD-10-CM | POA: Diagnosis not present

## 2017-12-31 DIAGNOSIS — E1165 Type 2 diabetes mellitus with hyperglycemia: Secondary | ICD-10-CM | POA: Diagnosis not present

## 2017-12-31 DIAGNOSIS — N3281 Overactive bladder: Secondary | ICD-10-CM | POA: Diagnosis not present

## 2017-12-31 DIAGNOSIS — E785 Hyperlipidemia, unspecified: Secondary | ICD-10-CM | POA: Diagnosis not present

## 2017-12-31 DIAGNOSIS — E1151 Type 2 diabetes mellitus with diabetic peripheral angiopathy without gangrene: Secondary | ICD-10-CM | POA: Diagnosis not present

## 2017-12-31 DIAGNOSIS — R69 Illness, unspecified: Secondary | ICD-10-CM | POA: Diagnosis not present

## 2017-12-31 DIAGNOSIS — E669 Obesity, unspecified: Secondary | ICD-10-CM | POA: Diagnosis not present

## 2018-01-03 DIAGNOSIS — R69 Illness, unspecified: Secondary | ICD-10-CM | POA: Diagnosis not present

## 2018-01-31 DIAGNOSIS — M545 Low back pain: Secondary | ICD-10-CM | POA: Diagnosis not present

## 2018-02-20 DIAGNOSIS — M961 Postlaminectomy syndrome, not elsewhere classified: Secondary | ICD-10-CM | POA: Diagnosis not present

## 2018-02-20 DIAGNOSIS — I10 Essential (primary) hypertension: Secondary | ICD-10-CM | POA: Diagnosis not present

## 2018-02-20 DIAGNOSIS — Z6833 Body mass index (BMI) 33.0-33.9, adult: Secondary | ICD-10-CM | POA: Diagnosis not present

## 2018-02-20 DIAGNOSIS — M545 Low back pain: Secondary | ICD-10-CM | POA: Diagnosis not present

## 2018-03-15 DIAGNOSIS — N390 Urinary tract infection, site not specified: Secondary | ICD-10-CM | POA: Diagnosis not present

## 2018-03-15 DIAGNOSIS — M4620 Osteomyelitis of vertebra, site unspecified: Secondary | ICD-10-CM | POA: Diagnosis not present

## 2018-03-15 DIAGNOSIS — G4733 Obstructive sleep apnea (adult) (pediatric): Secondary | ICD-10-CM | POA: Diagnosis not present

## 2018-03-15 DIAGNOSIS — M5137 Other intervertebral disc degeneration, lumbosacral region: Secondary | ICD-10-CM | POA: Diagnosis not present

## 2018-04-08 DIAGNOSIS — H00025 Hordeolum internum left lower eyelid: Secondary | ICD-10-CM | POA: Diagnosis not present

## 2018-04-16 DIAGNOSIS — E1142 Type 2 diabetes mellitus with diabetic polyneuropathy: Secondary | ICD-10-CM | POA: Diagnosis not present

## 2018-04-16 DIAGNOSIS — Z5181 Encounter for therapeutic drug level monitoring: Secondary | ICD-10-CM | POA: Diagnosis not present

## 2018-04-16 DIAGNOSIS — Z794 Long term (current) use of insulin: Secondary | ICD-10-CM | POA: Diagnosis not present

## 2018-05-01 DIAGNOSIS — R69 Illness, unspecified: Secondary | ICD-10-CM | POA: Diagnosis not present

## 2018-05-13 DIAGNOSIS — M961 Postlaminectomy syndrome, not elsewhere classified: Secondary | ICD-10-CM | POA: Diagnosis not present

## 2018-05-13 DIAGNOSIS — M545 Low back pain: Secondary | ICD-10-CM | POA: Diagnosis not present

## 2018-05-22 DIAGNOSIS — R69 Illness, unspecified: Secondary | ICD-10-CM | POA: Diagnosis not present

## 2018-05-27 DIAGNOSIS — M545 Low back pain: Secondary | ICD-10-CM | POA: Diagnosis not present

## 2018-06-21 DIAGNOSIS — M4620 Osteomyelitis of vertebra, site unspecified: Secondary | ICD-10-CM | POA: Diagnosis not present

## 2018-06-21 DIAGNOSIS — M5137 Other intervertebral disc degeneration, lumbosacral region: Secondary | ICD-10-CM | POA: Diagnosis not present

## 2018-06-21 DIAGNOSIS — G4733 Obstructive sleep apnea (adult) (pediatric): Secondary | ICD-10-CM | POA: Diagnosis not present

## 2018-06-21 DIAGNOSIS — N390 Urinary tract infection, site not specified: Secondary | ICD-10-CM | POA: Diagnosis not present

## 2018-07-15 DIAGNOSIS — N400 Enlarged prostate without lower urinary tract symptoms: Secondary | ICD-10-CM | POA: Diagnosis not present

## 2018-07-15 DIAGNOSIS — E1165 Type 2 diabetes mellitus with hyperglycemia: Secondary | ICD-10-CM | POA: Diagnosis not present

## 2018-07-15 DIAGNOSIS — E1142 Type 2 diabetes mellitus with diabetic polyneuropathy: Secondary | ICD-10-CM | POA: Diagnosis not present

## 2018-07-15 DIAGNOSIS — Z7984 Long term (current) use of oral hypoglycemic drugs: Secondary | ICD-10-CM | POA: Diagnosis not present

## 2018-07-15 DIAGNOSIS — I2510 Atherosclerotic heart disease of native coronary artery without angina pectoris: Secondary | ICD-10-CM | POA: Diagnosis not present

## 2018-07-15 DIAGNOSIS — N401 Enlarged prostate with lower urinary tract symptoms: Secondary | ICD-10-CM | POA: Diagnosis not present

## 2018-07-15 DIAGNOSIS — I10 Essential (primary) hypertension: Secondary | ICD-10-CM | POA: Diagnosis not present

## 2018-07-23 DIAGNOSIS — H6121 Impacted cerumen, right ear: Secondary | ICD-10-CM | POA: Diagnosis not present

## 2018-07-23 DIAGNOSIS — H6061 Unspecified chronic otitis externa, right ear: Secondary | ICD-10-CM | POA: Diagnosis not present

## 2018-08-07 DIAGNOSIS — J320 Chronic maxillary sinusitis: Secondary | ICD-10-CM | POA: Diagnosis not present

## 2018-08-07 DIAGNOSIS — H6121 Impacted cerumen, right ear: Secondary | ICD-10-CM | POA: Diagnosis not present

## 2018-08-07 DIAGNOSIS — J370 Chronic laryngitis: Secondary | ICD-10-CM | POA: Diagnosis not present

## 2018-08-07 DIAGNOSIS — J322 Chronic ethmoidal sinusitis: Secondary | ICD-10-CM | POA: Diagnosis not present

## 2018-08-12 DIAGNOSIS — M961 Postlaminectomy syndrome, not elsewhere classified: Secondary | ICD-10-CM | POA: Diagnosis not present

## 2018-08-12 DIAGNOSIS — Z6832 Body mass index (BMI) 32.0-32.9, adult: Secondary | ICD-10-CM | POA: Diagnosis not present

## 2018-08-12 DIAGNOSIS — M545 Low back pain: Secondary | ICD-10-CM | POA: Diagnosis not present

## 2018-08-23 DIAGNOSIS — I10 Essential (primary) hypertension: Secondary | ICD-10-CM | POA: Diagnosis not present

## 2018-08-23 DIAGNOSIS — I2510 Atherosclerotic heart disease of native coronary artery without angina pectoris: Secondary | ICD-10-CM | POA: Diagnosis not present

## 2018-08-23 DIAGNOSIS — E1165 Type 2 diabetes mellitus with hyperglycemia: Secondary | ICD-10-CM | POA: Diagnosis not present

## 2018-08-23 DIAGNOSIS — N401 Enlarged prostate with lower urinary tract symptoms: Secondary | ICD-10-CM | POA: Diagnosis not present

## 2018-08-23 DIAGNOSIS — E1142 Type 2 diabetes mellitus with diabetic polyneuropathy: Secondary | ICD-10-CM | POA: Diagnosis not present

## 2018-08-23 DIAGNOSIS — Z794 Long term (current) use of insulin: Secondary | ICD-10-CM | POA: Diagnosis not present

## 2018-08-23 DIAGNOSIS — N400 Enlarged prostate without lower urinary tract symptoms: Secondary | ICD-10-CM | POA: Diagnosis not present

## 2018-08-27 DIAGNOSIS — Z961 Presence of intraocular lens: Secondary | ICD-10-CM | POA: Diagnosis not present

## 2018-08-27 DIAGNOSIS — H04123 Dry eye syndrome of bilateral lacrimal glands: Secondary | ICD-10-CM | POA: Diagnosis not present

## 2018-08-27 DIAGNOSIS — E119 Type 2 diabetes mellitus without complications: Secondary | ICD-10-CM | POA: Diagnosis not present

## 2018-08-27 DIAGNOSIS — H26491 Other secondary cataract, right eye: Secondary | ICD-10-CM | POA: Diagnosis not present

## 2018-09-02 DIAGNOSIS — I10 Essential (primary) hypertension: Secondary | ICD-10-CM | POA: Diagnosis not present

## 2018-09-02 DIAGNOSIS — I2510 Atherosclerotic heart disease of native coronary artery without angina pectoris: Secondary | ICD-10-CM | POA: Diagnosis not present

## 2018-09-02 DIAGNOSIS — E1142 Type 2 diabetes mellitus with diabetic polyneuropathy: Secondary | ICD-10-CM | POA: Diagnosis not present

## 2018-09-02 DIAGNOSIS — N401 Enlarged prostate with lower urinary tract symptoms: Secondary | ICD-10-CM | POA: Diagnosis not present

## 2018-09-02 DIAGNOSIS — N400 Enlarged prostate without lower urinary tract symptoms: Secondary | ICD-10-CM | POA: Diagnosis not present

## 2018-09-02 DIAGNOSIS — E1165 Type 2 diabetes mellitus with hyperglycemia: Secondary | ICD-10-CM | POA: Diagnosis not present

## 2018-09-02 DIAGNOSIS — Z794 Long term (current) use of insulin: Secondary | ICD-10-CM | POA: Diagnosis not present

## 2018-09-25 DIAGNOSIS — M961 Postlaminectomy syndrome, not elsewhere classified: Secondary | ICD-10-CM | POA: Diagnosis not present

## 2018-09-25 DIAGNOSIS — I10 Essential (primary) hypertension: Secondary | ICD-10-CM | POA: Diagnosis not present

## 2018-09-25 DIAGNOSIS — Z6833 Body mass index (BMI) 33.0-33.9, adult: Secondary | ICD-10-CM | POA: Diagnosis not present

## 2018-09-25 DIAGNOSIS — M545 Low back pain: Secondary | ICD-10-CM | POA: Diagnosis not present

## 2018-10-09 DIAGNOSIS — R69 Illness, unspecified: Secondary | ICD-10-CM | POA: Diagnosis not present

## 2018-10-18 DIAGNOSIS — G4733 Obstructive sleep apnea (adult) (pediatric): Secondary | ICD-10-CM | POA: Diagnosis not present

## 2018-10-18 DIAGNOSIS — Z794 Long term (current) use of insulin: Secondary | ICD-10-CM | POA: Diagnosis not present

## 2018-10-18 DIAGNOSIS — M5137 Other intervertebral disc degeneration, lumbosacral region: Secondary | ICD-10-CM | POA: Diagnosis not present

## 2018-10-18 DIAGNOSIS — E1142 Type 2 diabetes mellitus with diabetic polyneuropathy: Secondary | ICD-10-CM | POA: Diagnosis not present

## 2018-10-18 DIAGNOSIS — M4620 Osteomyelitis of vertebra, site unspecified: Secondary | ICD-10-CM | POA: Diagnosis not present

## 2018-10-18 DIAGNOSIS — N390 Urinary tract infection, site not specified: Secondary | ICD-10-CM | POA: Diagnosis not present

## 2018-10-18 DIAGNOSIS — Z79899 Other long term (current) drug therapy: Secondary | ICD-10-CM | POA: Diagnosis not present

## 2018-10-29 DIAGNOSIS — K219 Gastro-esophageal reflux disease without esophagitis: Secondary | ICD-10-CM | POA: Diagnosis not present

## 2018-10-29 DIAGNOSIS — M48061 Spinal stenosis, lumbar region without neurogenic claudication: Secondary | ICD-10-CM | POA: Diagnosis not present

## 2018-10-29 DIAGNOSIS — I2510 Atherosclerotic heart disease of native coronary artery without angina pectoris: Secondary | ICD-10-CM | POA: Diagnosis not present

## 2018-10-29 DIAGNOSIS — N400 Enlarged prostate without lower urinary tract symptoms: Secondary | ICD-10-CM | POA: Diagnosis not present

## 2018-10-29 DIAGNOSIS — I10 Essential (primary) hypertension: Secondary | ICD-10-CM | POA: Diagnosis not present

## 2018-10-29 DIAGNOSIS — G4733 Obstructive sleep apnea (adult) (pediatric): Secondary | ICD-10-CM | POA: Diagnosis not present

## 2018-10-29 DIAGNOSIS — Z1389 Encounter for screening for other disorder: Secondary | ICD-10-CM | POA: Diagnosis not present

## 2018-10-29 DIAGNOSIS — Z0000 Encounter for general adult medical examination without abnormal findings: Secondary | ICD-10-CM | POA: Diagnosis not present

## 2018-10-31 DIAGNOSIS — Z6832 Body mass index (BMI) 32.0-32.9, adult: Secondary | ICD-10-CM | POA: Diagnosis not present

## 2018-10-31 DIAGNOSIS — I10 Essential (primary) hypertension: Secondary | ICD-10-CM | POA: Diagnosis not present

## 2018-10-31 DIAGNOSIS — M961 Postlaminectomy syndrome, not elsewhere classified: Secondary | ICD-10-CM | POA: Diagnosis not present

## 2018-10-31 DIAGNOSIS — M545 Low back pain: Secondary | ICD-10-CM | POA: Diagnosis not present

## 2018-11-06 DIAGNOSIS — N401 Enlarged prostate with lower urinary tract symptoms: Secondary | ICD-10-CM | POA: Diagnosis not present

## 2018-11-06 DIAGNOSIS — Z7984 Long term (current) use of oral hypoglycemic drugs: Secondary | ICD-10-CM | POA: Diagnosis not present

## 2018-11-06 DIAGNOSIS — E1165 Type 2 diabetes mellitus with hyperglycemia: Secondary | ICD-10-CM | POA: Diagnosis not present

## 2018-11-06 DIAGNOSIS — I2510 Atherosclerotic heart disease of native coronary artery without angina pectoris: Secondary | ICD-10-CM | POA: Diagnosis not present

## 2018-11-06 DIAGNOSIS — I10 Essential (primary) hypertension: Secondary | ICD-10-CM | POA: Diagnosis not present

## 2018-11-06 DIAGNOSIS — E1142 Type 2 diabetes mellitus with diabetic polyneuropathy: Secondary | ICD-10-CM | POA: Diagnosis not present

## 2018-11-06 DIAGNOSIS — N400 Enlarged prostate without lower urinary tract symptoms: Secondary | ICD-10-CM | POA: Diagnosis not present

## 2018-12-09 DIAGNOSIS — E1165 Type 2 diabetes mellitus with hyperglycemia: Secondary | ICD-10-CM | POA: Diagnosis not present

## 2018-12-09 DIAGNOSIS — N401 Enlarged prostate with lower urinary tract symptoms: Secondary | ICD-10-CM | POA: Diagnosis not present

## 2018-12-09 DIAGNOSIS — E1142 Type 2 diabetes mellitus with diabetic polyneuropathy: Secondary | ICD-10-CM | POA: Diagnosis not present

## 2018-12-09 DIAGNOSIS — N400 Enlarged prostate without lower urinary tract symptoms: Secondary | ICD-10-CM | POA: Diagnosis not present

## 2018-12-09 DIAGNOSIS — I2510 Atherosclerotic heart disease of native coronary artery without angina pectoris: Secondary | ICD-10-CM | POA: Diagnosis not present

## 2018-12-09 DIAGNOSIS — I10 Essential (primary) hypertension: Secondary | ICD-10-CM | POA: Diagnosis not present

## 2019-01-21 DIAGNOSIS — M961 Postlaminectomy syndrome, not elsewhere classified: Secondary | ICD-10-CM | POA: Diagnosis not present

## 2019-01-29 ENCOUNTER — Other Ambulatory Visit: Payer: Self-pay | Admitting: Internal Medicine

## 2019-01-29 DIAGNOSIS — M48061 Spinal stenosis, lumbar region without neurogenic claudication: Secondary | ICD-10-CM

## 2019-01-30 DIAGNOSIS — R51 Headache: Secondary | ICD-10-CM | POA: Diagnosis not present

## 2019-02-07 ENCOUNTER — Other Ambulatory Visit: Payer: Self-pay | Admitting: Internal Medicine

## 2019-02-08 ENCOUNTER — Ambulatory Visit
Admission: RE | Admit: 2019-02-08 | Discharge: 2019-02-08 | Disposition: A | Discharge status: Home / Self Care | Payer: Medicare Other | Source: Ambulatory Visit | Attending: Internal Medicine | Admitting: Internal Medicine

## 2019-02-08 DIAGNOSIS — M545 Low back pain: Secondary | ICD-10-CM | POA: Diagnosis not present

## 2019-02-08 DIAGNOSIS — M48061 Spinal stenosis, lumbar region without neurogenic claudication: Secondary | ICD-10-CM

## 2019-03-04 DIAGNOSIS — M961 Postlaminectomy syndrome, not elsewhere classified: Secondary | ICD-10-CM | POA: Diagnosis not present

## 2019-03-05 DIAGNOSIS — M961 Postlaminectomy syndrome, not elsewhere classified: Secondary | ICD-10-CM | POA: Diagnosis not present

## 2019-03-05 DIAGNOSIS — F4542 Pain disorder with related psychological factors: Secondary | ICD-10-CM | POA: Diagnosis not present

## 2019-04-17 DIAGNOSIS — E1142 Type 2 diabetes mellitus with diabetic polyneuropathy: Secondary | ICD-10-CM | POA: Diagnosis not present

## 2019-04-17 DIAGNOSIS — G4733 Obstructive sleep apnea (adult) (pediatric): Secondary | ICD-10-CM | POA: Diagnosis not present

## 2019-04-17 DIAGNOSIS — Z5181 Encounter for therapeutic drug level monitoring: Secondary | ICD-10-CM | POA: Diagnosis not present

## 2019-04-17 DIAGNOSIS — Z794 Long term (current) use of insulin: Secondary | ICD-10-CM | POA: Diagnosis not present

## 2019-05-09 DIAGNOSIS — L03116 Cellulitis of left lower limb: Secondary | ICD-10-CM | POA: Diagnosis not present

## 2019-05-13 DIAGNOSIS — I10 Essential (primary) hypertension: Secondary | ICD-10-CM | POA: Diagnosis not present

## 2019-05-13 DIAGNOSIS — G4733 Obstructive sleep apnea (adult) (pediatric): Secondary | ICD-10-CM | POA: Diagnosis not present

## 2019-05-13 DIAGNOSIS — K219 Gastro-esophageal reflux disease without esophagitis: Secondary | ICD-10-CM | POA: Diagnosis not present

## 2019-05-13 DIAGNOSIS — N400 Enlarged prostate without lower urinary tract symptoms: Secondary | ICD-10-CM | POA: Diagnosis not present

## 2019-06-03 DIAGNOSIS — M961 Postlaminectomy syndrome, not elsewhere classified: Secondary | ICD-10-CM | POA: Diagnosis not present

## 2019-06-25 DIAGNOSIS — R69 Illness, unspecified: Secondary | ICD-10-CM | POA: Diagnosis not present

## 2019-06-26 DIAGNOSIS — R69 Illness, unspecified: Secondary | ICD-10-CM | POA: Diagnosis not present

## 2019-07-18 DIAGNOSIS — G4733 Obstructive sleep apnea (adult) (pediatric): Secondary | ICD-10-CM | POA: Diagnosis not present

## 2019-07-24 DIAGNOSIS — Z5181 Encounter for therapeutic drug level monitoring: Secondary | ICD-10-CM | POA: Diagnosis not present

## 2019-07-24 DIAGNOSIS — E1165 Type 2 diabetes mellitus with hyperglycemia: Secondary | ICD-10-CM | POA: Diagnosis not present

## 2019-07-24 DIAGNOSIS — Z794 Long term (current) use of insulin: Secondary | ICD-10-CM | POA: Diagnosis not present

## 2019-07-24 DIAGNOSIS — E1142 Type 2 diabetes mellitus with diabetic polyneuropathy: Secondary | ICD-10-CM | POA: Diagnosis not present

## 2019-07-25 DIAGNOSIS — Z7984 Long term (current) use of oral hypoglycemic drugs: Secondary | ICD-10-CM | POA: Diagnosis not present

## 2019-07-25 DIAGNOSIS — E1165 Type 2 diabetes mellitus with hyperglycemia: Secondary | ICD-10-CM | POA: Diagnosis not present

## 2019-07-25 DIAGNOSIS — I10 Essential (primary) hypertension: Secondary | ICD-10-CM | POA: Diagnosis not present

## 2019-07-25 DIAGNOSIS — N400 Enlarged prostate without lower urinary tract symptoms: Secondary | ICD-10-CM | POA: Diagnosis not present

## 2019-07-25 DIAGNOSIS — E1142 Type 2 diabetes mellitus with diabetic polyneuropathy: Secondary | ICD-10-CM | POA: Diagnosis not present

## 2019-07-25 DIAGNOSIS — I2510 Atherosclerotic heart disease of native coronary artery without angina pectoris: Secondary | ICD-10-CM | POA: Diagnosis not present

## 2019-07-25 DIAGNOSIS — N401 Enlarged prostate with lower urinary tract symptoms: Secondary | ICD-10-CM | POA: Diagnosis not present

## 2019-08-07 DIAGNOSIS — H04123 Dry eye syndrome of bilateral lacrimal glands: Secondary | ICD-10-CM | POA: Diagnosis not present

## 2019-08-07 DIAGNOSIS — Z961 Presence of intraocular lens: Secondary | ICD-10-CM | POA: Diagnosis not present

## 2019-08-07 DIAGNOSIS — E119 Type 2 diabetes mellitus without complications: Secondary | ICD-10-CM | POA: Diagnosis not present

## 2019-08-07 DIAGNOSIS — H5711 Ocular pain, right eye: Secondary | ICD-10-CM | POA: Diagnosis not present

## 2019-08-07 DIAGNOSIS — H26491 Other secondary cataract, right eye: Secondary | ICD-10-CM | POA: Diagnosis not present

## 2019-08-18 DIAGNOSIS — G4733 Obstructive sleep apnea (adult) (pediatric): Secondary | ICD-10-CM | POA: Diagnosis not present

## 2019-09-02 DIAGNOSIS — M961 Postlaminectomy syndrome, not elsewhere classified: Secondary | ICD-10-CM | POA: Diagnosis not present

## 2019-09-09 DIAGNOSIS — R69 Illness, unspecified: Secondary | ICD-10-CM | POA: Diagnosis not present

## 2019-09-18 DIAGNOSIS — G4733 Obstructive sleep apnea (adult) (pediatric): Secondary | ICD-10-CM | POA: Diagnosis not present

## 2019-09-20 DIAGNOSIS — R69 Illness, unspecified: Secondary | ICD-10-CM | POA: Diagnosis not present

## 2019-10-15 DIAGNOSIS — E1142 Type 2 diabetes mellitus with diabetic polyneuropathy: Secondary | ICD-10-CM | POA: Diagnosis not present

## 2019-10-15 DIAGNOSIS — I2510 Atherosclerotic heart disease of native coronary artery without angina pectoris: Secondary | ICD-10-CM | POA: Diagnosis not present

## 2019-10-15 DIAGNOSIS — I10 Essential (primary) hypertension: Secondary | ICD-10-CM | POA: Diagnosis not present

## 2019-10-15 DIAGNOSIS — E1165 Type 2 diabetes mellitus with hyperglycemia: Secondary | ICD-10-CM | POA: Diagnosis not present

## 2019-10-15 DIAGNOSIS — N400 Enlarged prostate without lower urinary tract symptoms: Secondary | ICD-10-CM | POA: Diagnosis not present

## 2019-10-15 DIAGNOSIS — N401 Enlarged prostate with lower urinary tract symptoms: Secondary | ICD-10-CM | POA: Diagnosis not present

## 2019-10-23 DIAGNOSIS — R69 Illness, unspecified: Secondary | ICD-10-CM | POA: Diagnosis not present

## 2019-11-05 DIAGNOSIS — Z0000 Encounter for general adult medical examination without abnormal findings: Secondary | ICD-10-CM | POA: Diagnosis not present

## 2019-11-05 DIAGNOSIS — N182 Chronic kidney disease, stage 2 (mild): Secondary | ICD-10-CM | POA: Diagnosis not present

## 2019-11-05 DIAGNOSIS — I2510 Atherosclerotic heart disease of native coronary artery without angina pectoris: Secondary | ICD-10-CM | POA: Diagnosis not present

## 2019-11-05 DIAGNOSIS — I517 Cardiomegaly: Secondary | ICD-10-CM | POA: Diagnosis not present

## 2019-11-05 DIAGNOSIS — E1122 Type 2 diabetes mellitus with diabetic chronic kidney disease: Secondary | ICD-10-CM | POA: Diagnosis not present

## 2019-11-05 DIAGNOSIS — I10 Essential (primary) hypertension: Secondary | ICD-10-CM | POA: Diagnosis not present

## 2019-11-05 DIAGNOSIS — E1142 Type 2 diabetes mellitus with diabetic polyneuropathy: Secondary | ICD-10-CM | POA: Diagnosis not present

## 2019-11-05 DIAGNOSIS — G4733 Obstructive sleep apnea (adult) (pediatric): Secondary | ICD-10-CM | POA: Diagnosis not present

## 2019-11-05 DIAGNOSIS — N400 Enlarged prostate without lower urinary tract symptoms: Secondary | ICD-10-CM | POA: Diagnosis not present

## 2019-11-05 DIAGNOSIS — Z794 Long term (current) use of insulin: Secondary | ICD-10-CM | POA: Diagnosis not present

## 2019-11-05 DIAGNOSIS — K219 Gastro-esophageal reflux disease without esophagitis: Secondary | ICD-10-CM | POA: Diagnosis not present

## 2019-11-05 DIAGNOSIS — Z1389 Encounter for screening for other disorder: Secondary | ICD-10-CM | POA: Diagnosis not present

## 2019-11-27 DIAGNOSIS — R69 Illness, unspecified: Secondary | ICD-10-CM | POA: Diagnosis not present

## 2019-12-09 DIAGNOSIS — I10 Essential (primary) hypertension: Secondary | ICD-10-CM | POA: Diagnosis not present

## 2019-12-09 DIAGNOSIS — M545 Low back pain: Secondary | ICD-10-CM | POA: Diagnosis not present

## 2019-12-09 DIAGNOSIS — Z6831 Body mass index (BMI) 31.0-31.9, adult: Secondary | ICD-10-CM | POA: Diagnosis not present

## 2020-01-23 DIAGNOSIS — Z5181 Encounter for therapeutic drug level monitoring: Secondary | ICD-10-CM | POA: Diagnosis not present

## 2020-01-23 DIAGNOSIS — Z794 Long term (current) use of insulin: Secondary | ICD-10-CM | POA: Diagnosis not present

## 2020-01-23 DIAGNOSIS — E1142 Type 2 diabetes mellitus with diabetic polyneuropathy: Secondary | ICD-10-CM | POA: Diagnosis not present

## 2020-01-23 DIAGNOSIS — Z7189 Other specified counseling: Secondary | ICD-10-CM | POA: Diagnosis not present

## 2020-02-01 ENCOUNTER — Ambulatory Visit: Payer: Medicare HMO | Attending: Internal Medicine

## 2020-02-01 DIAGNOSIS — Z23 Encounter for immunization: Secondary | ICD-10-CM | POA: Insufficient documentation

## 2020-02-01 NOTE — Progress Notes (Signed)
   Covid-19 Vaccination Clinic  Name:  Kevin [name]    MRN: [id number] DOB: [birth year]  02/01/2020  Mr. Kevin [name] was observed post Covid-19 immunization for 15 minutes without incidence. He was provided with Vaccine Information Sheet and instruction to access the V-Safe system.   Mr. Kevin [name] was instructed to call 911 with any severe reactions post vaccine: Marland Kitchen Difficulty breathing  . Swelling of your face and throat  . A fast heartbeat  . A bad rash all over your body  . Dizziness and weakness    Immunizations Administered    Name Date Dose VIS Date Route   Pfizer COVID-19 Vaccine 02/01/2020  4:18 PM 0.3 mL 12/05/2019 Intramuscular   Manufacturer: [place]   Lot: [id number]   [place]: [id number]

## 2020-02-04 DIAGNOSIS — I2510 Atherosclerotic heart disease of native coronary artery without angina pectoris: Secondary | ICD-10-CM | POA: Diagnosis not present

## 2020-02-04 DIAGNOSIS — N400 Enlarged prostate without lower urinary tract symptoms: Secondary | ICD-10-CM | POA: Diagnosis not present

## 2020-02-04 DIAGNOSIS — E1165 Type 2 diabetes mellitus with hyperglycemia: Secondary | ICD-10-CM | POA: Diagnosis not present

## 2020-02-04 DIAGNOSIS — I10 Essential (primary) hypertension: Secondary | ICD-10-CM | POA: Diagnosis not present

## 2020-02-04 DIAGNOSIS — N401 Enlarged prostate with lower urinary tract symptoms: Secondary | ICD-10-CM | POA: Diagnosis not present

## 2020-02-04 DIAGNOSIS — E1122 Type 2 diabetes mellitus with diabetic chronic kidney disease: Secondary | ICD-10-CM | POA: Diagnosis not present

## 2020-02-04 DIAGNOSIS — Z7984 Long term (current) use of oral hypoglycemic drugs: Secondary | ICD-10-CM | POA: Diagnosis not present

## 2020-02-04 DIAGNOSIS — E1142 Type 2 diabetes mellitus with diabetic polyneuropathy: Secondary | ICD-10-CM | POA: Diagnosis not present

## 2020-02-04 DIAGNOSIS — N182 Chronic kidney disease, stage 2 (mild): Secondary | ICD-10-CM | POA: Diagnosis not present

## 2020-02-17 ENCOUNTER — Ambulatory Visit: Payer: Medicare HMO

## 2020-02-26 ENCOUNTER — Ambulatory Visit: Payer: Medicare HMO

## 2020-02-26 ENCOUNTER — Inpatient Hospital Stay (HOSPITAL_COMMUNITY)
Admission: EM | Admit: 2020-02-26 | Discharge: 2020-02-28 | DRG: 871 | Disposition: A | Discharge status: Home / Self Care | Payer: Medicare HMO | Attending: Family Medicine | Admitting: Family Medicine

## 2020-02-26 ENCOUNTER — Emergency Department (HOSPITAL_COMMUNITY): Payer: Medicare HMO

## 2020-02-26 ENCOUNTER — Other Ambulatory Visit: Payer: Self-pay

## 2020-02-26 ENCOUNTER — Encounter (HOSPITAL_COMMUNITY): Payer: Self-pay | Admitting: *Deleted

## 2020-02-26 DIAGNOSIS — Z20822 Contact with and (suspected) exposure to covid-19: Secondary | ICD-10-CM | POA: Diagnosis not present

## 2020-02-26 DIAGNOSIS — N200 Calculus of kidney: Secondary | ICD-10-CM | POA: Diagnosis not present

## 2020-02-26 DIAGNOSIS — J690 Pneumonitis due to inhalation of food and vomit: Secondary | ICD-10-CM | POA: Diagnosis not present

## 2020-02-26 DIAGNOSIS — E785 Hyperlipidemia, unspecified: Secondary | ICD-10-CM | POA: Diagnosis present

## 2020-02-26 DIAGNOSIS — G4733 Obstructive sleep apnea (adult) (pediatric): Secondary | ICD-10-CM | POA: Diagnosis not present

## 2020-02-26 DIAGNOSIS — E1165 Type 2 diabetes mellitus with hyperglycemia: Secondary | ICD-10-CM | POA: Diagnosis not present

## 2020-02-26 DIAGNOSIS — J189 Pneumonia, unspecified organism: Secondary | ICD-10-CM | POA: Diagnosis not present

## 2020-02-26 DIAGNOSIS — Z87891 Personal history of nicotine dependence: Secondary | ICD-10-CM

## 2020-02-26 DIAGNOSIS — I4891 Unspecified atrial fibrillation: Secondary | ICD-10-CM

## 2020-02-26 DIAGNOSIS — A419 Sepsis, unspecified organism: Secondary | ICD-10-CM | POA: Diagnosis not present

## 2020-02-26 DIAGNOSIS — Z794 Long term (current) use of insulin: Secondary | ICD-10-CM

## 2020-02-26 DIAGNOSIS — G8929 Other chronic pain: Secondary | ICD-10-CM | POA: Diagnosis present

## 2020-02-26 DIAGNOSIS — R0902 Hypoxemia: Secondary | ICD-10-CM | POA: Diagnosis not present

## 2020-02-26 DIAGNOSIS — R0602 Shortness of breath: Secondary | ICD-10-CM | POA: Diagnosis not present

## 2020-02-26 DIAGNOSIS — K449 Diaphragmatic hernia without obstruction or gangrene: Secondary | ICD-10-CM | POA: Diagnosis not present

## 2020-02-26 DIAGNOSIS — K219 Gastro-esophageal reflux disease without esophagitis: Secondary | ICD-10-CM | POA: Diagnosis not present

## 2020-02-26 DIAGNOSIS — E1136 Type 2 diabetes mellitus with diabetic cataract: Secondary | ICD-10-CM | POA: Diagnosis not present

## 2020-02-26 DIAGNOSIS — N3001 Acute cystitis with hematuria: Secondary | ICD-10-CM | POA: Diagnosis present

## 2020-02-26 DIAGNOSIS — I480 Paroxysmal atrial fibrillation: Secondary | ICD-10-CM | POA: Diagnosis not present

## 2020-02-26 DIAGNOSIS — G4730 Sleep apnea, unspecified: Secondary | ICD-10-CM | POA: Diagnosis present

## 2020-02-26 DIAGNOSIS — I10 Essential (primary) hypertension: Secondary | ICD-10-CM | POA: Diagnosis not present

## 2020-02-26 DIAGNOSIS — R4182 Altered mental status, unspecified: Secondary | ICD-10-CM | POA: Diagnosis not present

## 2020-02-26 DIAGNOSIS — M1990 Unspecified osteoarthritis, unspecified site: Secondary | ICD-10-CM | POA: Diagnosis present

## 2020-02-26 DIAGNOSIS — R404 Transient alteration of awareness: Secondary | ICD-10-CM | POA: Diagnosis not present

## 2020-02-26 DIAGNOSIS — G9341 Metabolic encephalopathy: Secondary | ICD-10-CM | POA: Diagnosis not present

## 2020-02-26 DIAGNOSIS — Z8249 Family history of ischemic heart disease and other diseases of the circulatory system: Secondary | ICD-10-CM | POA: Diagnosis not present

## 2020-02-26 DIAGNOSIS — E669 Obesity, unspecified: Secondary | ICD-10-CM | POA: Diagnosis present

## 2020-02-26 DIAGNOSIS — Z6833 Body mass index (BMI) 33.0-33.9, adult: Secondary | ICD-10-CM

## 2020-02-26 DIAGNOSIS — R410 Disorientation, unspecified: Secondary | ICD-10-CM | POA: Diagnosis not present

## 2020-02-26 LAB — URINALYSIS, ROUTINE W REFLEX MICROSCOPIC
Bilirubin Urine: NEGATIVE
Glucose, UA: 150 mg/dL — AB
Ketones, ur: NEGATIVE mg/dL
Leukocytes,Ua: NEGATIVE
Nitrite: NEGATIVE
Protein, ur: 30 mg/dL — AB
RBC / HPF: >50 RBC/hpf — ABNORMAL HIGH (ref 0–5)
Specific Gravity, Urine: 1.017 (ref 1.005–1.030)
pH: 6 (ref 5.0–8.0)

## 2020-02-26 LAB — LACTIC ACID, PLASMA: Lactic Acid, Venous: 1.5 mmol/L (ref 0.5–1.9)

## 2020-02-26 LAB — COMPREHENSIVE METABOLIC PANEL
ALT: 54 U/L — ABNORMAL HIGH (ref 0–44)
AST: 51 U/L — ABNORMAL HIGH (ref 15–41)
Albumin: 3.5 g/dL (ref 3.5–5.0)
Alkaline Phosphatase: 112 U/L (ref 38–126)
Anion gap: 12 (ref 5–15)
BUN: 12 mg/dL (ref 8–23)
CO2: 23 mmol/L (ref 22–32)
Calcium: 9.4 mg/dL (ref 8.9–10.3)
Chloride: 102 mmol/L (ref 98–111)
Creatinine, Ser: 1.16 mg/dL (ref 0.61–1.24)
GFR calc Af Amer: >60 mL/min (ref >60)
GFR calc non Af Amer: >60 mL/min (ref >60)
Glucose, Bld: 194 mg/dL — ABNORMAL HIGH (ref 70–99)
Potassium: 4.5 mmol/L (ref 3.5–5.1)
Sodium: 137 mmol/L (ref 135–145)
Total Bilirubin: 1.2 mg/dL (ref 0.3–1.2)
Total Protein: 7.4 g/dL (ref 6.5–8.1)

## 2020-02-26 LAB — CBC WITH DIFFERENTIAL/PLATELET
Abs Immature Granulocytes: 0.04 10*3/uL (ref 0.00–0.07)
Basophils Absolute: 0 10*3/uL (ref 0.0–0.1)
Basophils Relative: 0 %
Eosinophils Absolute: 0.1 10*3/uL (ref 0.0–0.5)
Eosinophils Relative: 1 %
HCT: 41 % (ref 39.0–52.0)
Hemoglobin: 12.9 g/dL — ABNORMAL LOW (ref 13.0–17.0)
Immature Granulocytes: 0 %
Lymphocytes Relative: 6 %
Lymphs Abs: 0.6 10*3/uL — ABNORMAL LOW (ref 0.7–4.0)
MCH: 27.6 pg (ref 26.0–34.0)
MCHC: 31.5 g/dL (ref 30.0–36.0)
MCV: 87.8 fL (ref 80.0–100.0)
Monocytes Absolute: 0.6 10*3/uL (ref 0.1–1.0)
Monocytes Relative: 7 %
Neutro Abs: 8 10*3/uL — ABNORMAL HIGH (ref 1.7–7.7)
Neutrophils Relative %: 86 %
Platelets: 196 10*3/uL (ref 150–400)
RBC: 4.67 MIL/uL (ref 4.22–5.81)
RDW: 14.6 % (ref 11.5–15.5)
WBC: 9.3 10*3/uL (ref 4.0–10.5)
nRBC: 0 % (ref 0.0–0.2)

## 2020-02-26 LAB — CBG MONITORING, ED
Glucose-Capillary: 182 mg/dL — ABNORMAL HIGH (ref 70–99)
Glucose-Capillary: 180 mg/dL — ABNORMAL HIGH (ref 70–99)
Glucose-Capillary: 191 mg/dL — ABNORMAL HIGH (ref 70–99)

## 2020-02-26 LAB — URINE CULTURE

## 2020-02-26 LAB — PROCALCITONIN: Procalcitonin: 0.36 ng/mL

## 2020-02-26 LAB — HEMOGLOBIN A1C
Hgb A1c MFr Bld: 7.9 % — ABNORMAL HIGH (ref 4.8–5.6)
Mean Plasma Glucose: 180.03 mg/dL

## 2020-02-26 LAB — SARS CORONAVIRUS 2 (TAT 6-24 HRS): SARS Coronavirus 2: NEGATIVE

## 2020-02-26 LAB — PROTIME-INR
INR: 1.1 (ref 0.8–1.2)
Prothrombin Time: 14.1 seconds (ref 11.4–15.2)

## 2020-02-26 LAB — POC SARS CORONAVIRUS 2 AG -  ED: SARS Coronavirus 2 Ag: NEGATIVE

## 2020-02-26 LAB — APTT: aPTT: 33 seconds (ref 24–36)

## 2020-02-26 MED ORDER — OXYCODONE HCL 5 MG PO TABS
10.0000 mg | ORAL_TABLET | ORAL | Status: DC | PRN
  Administered 2020-02-26 – 2020-02-28 (×8): 10 mg via ORAL
  Filled 2020-02-26 (×9): qty 2

## 2020-02-26 MED ORDER — ENOXAPARIN SODIUM 40 MG/0.4ML ~~LOC~~ SOLN
40.0000 mg | SUBCUTANEOUS | Status: DC
  Administered 2020-02-26 – 2020-02-27 (×2): 40 mg via SUBCUTANEOUS
  Filled 2020-02-26 (×2): qty 0.4

## 2020-02-26 MED ORDER — PANTOPRAZOLE SODIUM 40 MG PO TBEC
40.0000 mg | DELAYED_RELEASE_TABLET | Freq: Every day | ORAL | Status: DC
  Administered 2020-02-26 – 2020-02-28 (×3): 40 mg via ORAL
  Filled 2020-02-26 (×3): qty 1

## 2020-02-26 MED ORDER — SODIUM CHLORIDE 0.9% FLUSH
3.0000 mL | Freq: Two times a day (BID) | INTRAVENOUS | Status: DC
  Administered 2020-02-27 – 2020-02-28 (×3): 3 mL via INTRAVENOUS

## 2020-02-26 MED ORDER — INSULIN ASPART PROT & ASPART (70-30 MIX) 100 UNIT/ML ~~LOC~~ SUSP
45.0000 [IU] | Freq: Two times a day (BID) | SUBCUTANEOUS | Status: DC
  Administered 2020-02-26 – 2020-02-27 (×3): 45 [IU] via SUBCUTANEOUS
  Filled 2020-02-26 (×2): qty 10

## 2020-02-26 MED ORDER — INSULIN ASPART 100 UNIT/ML ~~LOC~~ SOLN
0.0000 [IU] | Freq: Three times a day (TID) | SUBCUTANEOUS | Status: DC
  Administered 2020-02-27: 5 [IU] via SUBCUTANEOUS
  Administered 2020-02-28: 3 [IU] via SUBCUTANEOUS

## 2020-02-26 MED ORDER — POLYETHYLENE GLYCOL 3350 17 G PO PACK: 17.0000 g | PACK | Freq: Every day | ORAL | Status: DC | PRN

## 2020-02-26 MED ORDER — INSULIN ASPART 100 UNIT/ML ~~LOC~~ SOLN
0.0000 [IU] | Freq: Every day | SUBCUTANEOUS | Status: DC
  Administered 2020-02-26: 0 [IU] via SUBCUTANEOUS

## 2020-02-26 MED ORDER — VANCOMYCIN HCL 2000 MG/400ML IV SOLN
2000.0000 mg | Freq: Once | INTRAVENOUS | Status: AC
  Administered 2020-02-26: 2000 mg via INTRAVENOUS
  Filled 2020-02-26: qty 400

## 2020-02-26 MED ORDER — LACTATED RINGERS IV BOLUS
500.0000 mL | Freq: Once | INTRAVENOUS | Status: AC
  Administered 2020-02-26: 500 mL via INTRAVENOUS

## 2020-02-26 MED ORDER — SODIUM CHLORIDE 0.9 % IV SOLN
2.0000 g | Freq: Once | INTRAVENOUS | Status: AC
  Administered 2020-02-26: 2 g via INTRAVENOUS
  Filled 2020-02-26: qty 2

## 2020-02-26 MED ORDER — SODIUM CHLORIDE 0.9 % IV SOLN
1.0000 g | INTRAVENOUS | Status: DC
  Administered 2020-02-26 – 2020-02-27 (×2): 1 g via INTRAVENOUS
  Filled 2020-02-26 (×2): qty 10

## 2020-02-26 MED ORDER — ONDANSETRON HCL 4 MG/2ML IJ SOLN: 4.0000 mg | Freq: Four times a day (QID) | INTRAMUSCULAR | Status: DC | PRN

## 2020-02-26 MED ORDER — ACETAMINOPHEN 650 MG RE SUPP: 650.0000 mg | Freq: Four times a day (QID) | RECTAL | Status: DC | PRN

## 2020-02-26 MED ORDER — ONDANSETRON HCL 4 MG PO TABS: 4.0000 mg | ORAL_TABLET | Freq: Four times a day (QID) | ORAL | Status: DC | PRN

## 2020-02-26 MED ORDER — ACETAMINOPHEN 500 MG PO TABS
1000.0000 mg | ORAL_TABLET | Freq: Once | ORAL | Status: DC
  Filled 2020-02-26: qty 2

## 2020-02-26 MED ORDER — IOHEXOL 300 MG/ML  SOLN
100.0000 mL | Freq: Once | INTRAMUSCULAR | Status: AC | PRN
  Administered 2020-02-26: 100 mL via INTRAVENOUS

## 2020-02-26 MED ORDER — TAMSULOSIN HCL 0.4 MG PO CAPS
0.4000 mg | ORAL_CAPSULE | Freq: Every day | ORAL | Status: DC
  Administered 2020-02-26 – 2020-02-28 (×3): 0.4 mg via ORAL
  Filled 2020-02-26 (×3): qty 1

## 2020-02-26 MED ORDER — ATORVASTATIN CALCIUM 80 MG PO TABS
80.0000 mg | ORAL_TABLET | Freq: Every day | ORAL | Status: DC
  Administered 2020-02-26 – 2020-02-27 (×2): 80 mg via ORAL
  Filled 2020-02-26 (×2): qty 1

## 2020-02-26 MED ORDER — DOCUSATE SODIUM 100 MG PO CAPS
100.0000 mg | ORAL_CAPSULE | Freq: Two times a day (BID) | ORAL | Status: DC
  Administered 2020-02-26 – 2020-02-27 (×3): 100 mg via ORAL
  Filled 2020-02-26 (×4): qty 1

## 2020-02-26 MED ORDER — ACETAMINOPHEN 325 MG PO TABS
650.0000 mg | ORAL_TABLET | Freq: Four times a day (QID) | ORAL | Status: DC | PRN
  Filled 2020-02-26: qty 2

## 2020-02-26 MED ORDER — LACTATED RINGERS IV SOLN
INTRAVENOUS | Status: DC
  Administered 2020-02-26: 500 mL via INTRAVENOUS

## 2020-02-26 NOTE — ED Notes (Signed)
Call pts wife Horris Latino [phone] she is in the parking lot

## 2020-02-26 NOTE — ED Provider Notes (Signed)
Emergency Department Provider Note   I have reviewed the triage vital signs and the nursing notes.   HISTORY  Chief Complaint No chief complaint on file.   HPI Kevin [name] is a 74 y.o. male who presents via EMS from home for altered mental status.  The history comes to the wife as the patient is confused.  She states that he is normally alert and oriented no known dementia or Alzheimer's.  He was in his normal state of health until this morning when he started choking on a sausage biscuit.  Was not significantly violent but after that he became more more confused.  She states that he was found urinating on the floor in the laundry room.  No new cough, headache neck pain or Covid contacts.  First Covid shot was in the middle of February.  Wife states that nothing like this is ever happened before.  She does state that he has had increasing urinary frequency recently.  No dysuria that she knows of.  She states he has had some prostate problems in the past.  He is disoriented to symptoms and why he is here at this time.   Level V Caveat secondary to altered mental status and disorientation.   Past Medical History:  Diagnosis Date  . Diabetes mellitus   . Fall   . FUO (fever of unknown origin) 01/19/2016  . GERD (gastroesophageal reflux disease)   . History of hiatal hernia   . Hypertension   . Occasional tremors   . Prostatitis 01/19/2016  . Sleep apnea    . Last test was before 2007 , not sure name of test site. uses CPAP  . Syncope   . Wears glasses     Patient Active Problem List   Diagnosis Date Noted  . Cholelithiasis with chronic cholecystitis 06/15/2016  . Cholelithiasis with cholecystitis 06/14/2016  . FUO (fever of unknown origin) 01/19/2016  . Prostatitis 01/19/2016  . Pseudomonas aeruginosa infection 01/21/2015  . 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 Diskitis 01/23/2013  . Swelling of arm 01/23/2013  . Encephalopathy acute 12/28/2012  . UTI (lower  urinary tract infection) 12/28/2012  . Spondylolisthesis of lumbar region 08/30/2012  . COUGH VARIANT ASTHMA 10/09/2008  . HYPERTENSION 09/12/2008  . SLEEP APNEA 09/12/2008  . COUGH 09/12/2008  . ALLERGIC RHINITIS 09/04/2008  . DIABETES MELLITUS 09/03/2008  . HYPERLIPIDEMIA 09/03/2008  . OBESITY, MORBID 09/03/2008  . 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 ARTHRITIS 09/03/2008    Past Surgical History:  Procedure Laterality Date  . BACK SURGERY    . CHOLECYSTECTOMY N/A 06/15/2016   Procedure: LAPAROSCOPIC CHOLECYSTECTOMY WITH INTRAOPERATIVE CHOLANGIOGRAM;  Surgeon: Armandina Gemma, MD;  Location: [facility];  Service: General;  Laterality: N/A;  . Colonoscopy  2011  . EYE SURGERY     Cataract 2013  . HIATAL HERNIA REPAIR     x2  . KNEE ARTHROSCOPY     bil  . PATELLA FRACTURE SURGERY      Current Outpatient Rx  . Order #: [id number] Class: Historical Med  . Order #: [id number] Class: Historical Med  . Order #: [id number] Class: Historical Med  . Order #: [id number] Class: Historical Med  . Order #: [id number] Class: Historical Med  . Order #: [id number] Class: Historical Med  . Order #: [id number] Class: Historical Med  . Order #: [id number] Class: Historical Med  . Order #: [id number] Class: Historical Med  . Order #: [id number] Class: Historical Med  . Order #: [id number] Class: Print  . Order #: [id number] Class: Print  Allergies Patient has no known allergies.  Family History  Problem Relation Age of Onset  . Coronary artery disease Other     Social History Social History   Tobacco Use  . Smoking status: Former Smoker    Packs/day: 1.00    Years: 4.00    Pack years: 4.00    Types: Cigarettes    Quit date: 09/04/1980    Years since quitting: 39.5  . Smokeless tobacco: Never Used  Substance Use Topics  . Alcohol use: No    Alcohol/week: 0.0 standard drinks    Comment: rarely  . Drug use: No    Review of Systems  All other systems negative except as documented in the HPI. All  pertinent positives and negatives as reviewed in the HPI. ____________________________________________   PHYSICAL EXAM:  VITAL SIGNS: ED Triage Vitals  Enc Vitals Group     BP 02/26/20 0302 (!) 145/73     Pulse Rate 02/26/20 0302 84     Resp 02/26/20 0302 (!) 24     Temp 02/26/20 0302 98.7 F (37.1 C)     Temp Source 02/26/20 0302 Oral     SpO2 02/26/20 0302 97 %     Weight 02/26/20 0415 255 lb (115.7 kg)     Height 02/26/20 0415 6\' 1"  (1.854 m)    Constitutional: Alert but disoriented. Well appearing and in no acute distress. Eyes: Conjunctivae are normal. PERRL. EOMI. Head: Atraumatic. Nose: No congestion/rhinnorhea. Mouth/Throat: Mucous membranes are moist.  Oropharynx non-erythematous. Neck: No stridor.  No meningeal signs.   Cardiovascular: Normal rate, regular rhythm. Good peripheral circulation. Grossly normal heart sounds.   Respiratory: tachypneic respiratory effort.  No retractions. Lungs CTAB. Gastrointestinal: Soft and nontender. No distention.  Musculoskeletal: No lower extremity tenderness nor edema. No gross deformities of extremities. Neurologic:  Normal speech and language. No gross focal neurologic deficits are appreciated.  Skin:  Skin is warm, dry and intact. No rash noted.  ____________________________________________   LABS (all labs ordered are listed, but only abnormal results are displayed)  Labs Reviewed  COMPREHENSIVE METABOLIC PANEL - Abnormal; Notable for the following components:      Result Value   Glucose, Bld 194 (*)    AST 51 (*)    ALT 54 (*)    All other components within normal limits  CBC WITH DIFFERENTIAL/PLATELET - Abnormal; Notable for the following components:   Hemoglobin 12.9 (*)    Neutro Abs 8.0 (*)    Lymphs Abs 0.6 (*)    All other components within normal limits  URINALYSIS, ROUTINE W REFLEX MICROSCOPIC - Abnormal; Notable for the following components:   Glucose, UA 150 (*)    Hgb urine dipstick MODERATE (*)     Protein, ur 30 (*)    RBC / HPF >50 (*)    Bacteria, UA FEW (*)    All other components within normal limits  CULTURE, BLOOD (ROUTINE X 2)  CULTURE, BLOOD (ROUTINE X 2)  URINE CULTURE  LACTIC ACID, PLASMA  APTT  PROTIME-INR  LACTIC ACID, PLASMA  POC SARS CORONAVIRUS 2 AG -  ED   ____________________________________________  EKG   EKG Interpretation  Date/Time:  Thursday February 26 2020 03:01:35 EST Ventricular Rate:  85 PR Interval:    QRS Duration: 93 QT Interval:  359 QTC Calculation: 427 R Axis:   -2 Text Interpretation: Sinus rhythm Confirmed by Merrily Pew [phone]) on 02/26/2020 3:38:13 AM      ____________________________________________  RADIOLOGY  Darletta Moll Chest [facility]  1 View  Result Date: 02/26/2020 CLINICAL DATA:  Shortness of breath EXAM: PORTABLE CHEST 1 VIEW COMPARISON:  12/21/2015 FINDINGS: Cardiac shadow is enlarged but stable. Tortuous thoracic aorta is noted. The lungs are well aerated bilaterally. Elevation of the right hemidiaphragm is noted. IMPRESSION: No active disease. Electronically Signed   By: Inez Catalina M.D.   On: 02/26/2020 03:41    ____________________________________________   PROCEDURES  Procedure(s) performed:   .Critical Care Performed by: Merrily Pew, MD Authorized by: Merrily Pew, MD   Critical care provider statement:    Critical care time (minutes):  45   Critical care was necessary to treat or prevent imminent or life-threatening deterioration of the following conditions:  Sepsis   Critical care was time spent personally by me on the following activities:  Discussions with consultants, evaluation of patient's response to treatment, examination of patient, ordering and performing treatments and interventions, ordering and review of laboratory studies, ordering and review of radiographic studies, pulse oximetry, re-evaluation of patient's condition, obtaining history from patient or surrogate and review of old  charts     ____________________________________________   INITIAL IMPRESSION / [place] / ED COURSE  Patient febrile so code sepsis called for concern of possible urinary tract versus respiratory infection.  Covid test ordered.  Patient is disoriented to situation and recent history.  Urine possible looks infected, culture pending.  Patient refusing Tylenol.  X-ray is overall okay.  Still concern remains for possible prostatitis versus pyelonephritis versus pneumonia so will get CT scan of his chest abdomen pelvis.  CT consistent with right lower lobe pneumonia after reviewed by myself and radiology.  However this could be aspiration event from what happened earlier today but with his tachypnea, fever questionable urinary tract infection and as long finding I think he is best served to be admitted to the hospital for further observation to ensure he is improving prior to discharge.  Discussed with hospitalist for same.  Pertinent labs & imaging results that were available during my care of the patient were reviewed by me and considered in my medical decision making (see chart for details).  ____________________________________________  FINAL CLINICAL IMPRESSION(S) / ED DIAGNOSES  Final diagnoses:  Community acquired pneumonia of right lower lobe of lung  Acute cystitis with hematuria     MEDICATIONS GIVEN DURING THIS VISIT:  Medications  acetaminophen (TYLENOL) tablet 1,000 mg (1,000 mg Oral Not Given 02/26/20 0444)  vancomycin (VANCOREADY) IVPB 2000 mg/400 mL (2,000 mg Intravenous New Bag/Given 02/26/20 0529)  ceFEPIme (MAXIPIME) 2 g in sodium chloride 0.9 % 100 mL IVPB (0 g Intravenous Stopped 02/26/20 0525)  lactated ringers bolus 500 mL (0 mLs Intravenous Stopped 02/26/20 0525)     NEW OUTPATIENT MEDICATIONS STARTED DURING THIS VISIT:  New Prescriptions   No medications on file    Note:  This note was prepared with assistance of Dragon voice recognition software.  Occasional wrong-word or sound-a-like substitutions may have occurred due to the inherent limitations of voice recognition software.   Ej Pinson, Corene Cornea, MD 02/27/20 0001

## 2020-02-26 NOTE — ED Notes (Signed)
Patient was sitting on the end of the stretcher , multiple times have re-enforced the need to stay on the stretcher and to not take his monitor off. Condom cath applied

## 2020-02-26 NOTE — ED Notes (Signed)
Wife at bedside , states patient became nauseated after breakfast yest and had dry heaves for approx. 1 hour wife gave patient tums and ginger ale, states he felt better. Approx. 130am  Wife found patient in the laundry room and he had urinated on the floor , states he would not respond to her  She took his temp it was 101 she wanted to take patient to the ed he refused. States she called ems and they talked patient into coming.

## 2020-02-26 NOTE — ED Notes (Signed)
Pt given sandwich and diet soda for lunch. Insulin administered. Pt currently A+Ox4 following all directions, no neuro deficits noted at this time. Resting comfortably in bed eating.

## 2020-02-26 NOTE — ED Triage Notes (Signed)
Pt coming from EMS after wife called. Wife said pt seemed "off" all day. Woke up around noon, went eat breakfast and had a "choking episode", but heimlich was not performed. Pt then developed hives afterward. Pt urinated in laundry room. Can answer questions appropriately but has delayed responses, has odd mannerisms. High glucose at 237, BP 166/90, 96HR, 95% RA, temp of 101.9

## 2020-02-26 NOTE — H&P (Signed)
History and Physical    Kevin [name] [id number] DOB: February 15, 1946 DOA: 02/26/2020  PCP: Lavone Orn, MD Consultants:  Buddy Duty - endocrinology; Thayer Ohm - pain management Patient coming from:  Home - lives with wife; NOK: Wife, Benyamin Nakamura, [phone]  Chief Complaint: AMS  HPI: Kevin [name] is a 74 y.o. male with medical history significant of OSA on CPAP; HTN; and DM presenting with AMS.  He reports that his wife sent him in because she thought he was confused - but he is not sure that he is.  He reports long-term word finding difficulties and difficulty with remembering things, but is unable to clearly define what has been going on.   I spoke with his wife.  She reports that yesterday, he ate around 11 and had coffee and a banana.  About 12pm, he ate a sausage biscuit.  He got an upset stomach shortly thereafter; they thought maybe the sandwich was not good.  He was nauseated with dry heaves and had a fever.  He was very weak.  She gave him Tums, ginger ale, saltines.  Maybe an hour later, he seemed to feel better.  About 1AM, she found that he had urinated on the floor and was leaning on the sink.  He wasn't really speaking.  She brought him a chair and found that he had a fever to 101.  She decided to send him to the ER and got him dressed.  Then he refused to go to the ER.  He didn't seem to be processing the information.  She called 911. He does "talk out of his mind a little bit" when he gets infections.  He also had hives on his upper torso.  She reports that he takes a lot of medications due to a failed back surgery and so he does not remember things as well as prior.  "His cognition is okay, he can drive.... I am not 100% concerned about his cognition per se except when he is sick."  Much of this may be associated with his pain meds as well as the added infection. He has been having increased urinary frequency recently.  He has a "special cup that he used" when he has  incontinence.   ED Course:  AMS.  ?UTI with PNA - no cough but disoriented (no underlying dementia).  Ate breakfast and coughed, ?aspirated and then really confused - peed in the floor.  Febrile to 101+, tachypnea.  UA mildly abnormal.  CT with RLL infiltrate.  BD COVID negative.  Sepsis eval started, given Vanc, Cefepime.   Review of Systems: As per HPI; otherwise review of systems reviewed and negative.   Ambulatory Status:   Ambulates without assistance  Past Medical History:  Diagnosis Date  . Diabetes mellitus   . Fall   . FUO (fever of unknown origin) 01/19/2016  . GERD (gastroesophageal reflux disease)   . History of hiatal hernia   . Hypertension   . Occasional tremors   . Prostatitis 01/19/2016  . Sleep apnea    . Last test was before 2007 , not sure name of test site. uses CPAP  . Syncope   . Wears glasses     Past Surgical History:  Procedure Laterality Date  . BACK SURGERY    . CHOLECYSTECTOMY N/A 06/15/2016   Procedure: LAPAROSCOPIC CHOLECYSTECTOMY WITH INTRAOPERATIVE CHOLANGIOGRAM;  Surgeon: Armandina Gemma, MD;  Location: [facility];  Service: General;  Laterality: N/A;  . Colonoscopy  2011  . EYE SURGERY  Cataract 2013  . HIATAL HERNIA REPAIR     x2  . KNEE ARTHROSCOPY     bil  . PATELLA FRACTURE SURGERY      Social History   Socioeconomic History  . Marital status: Married    Spouse name: Not on file  . Number of children: Not on file  . Years of education: Not on file  . Highest education level: Not on file  Occupational History  . Not on file  Tobacco Use  . Smoking status: Former Smoker    Packs/day: 1.00    Years: 4.00    Pack years: 4.00    Types: Cigarettes    Quit date: 09/04/1980    Years since quitting: 39.5  . Smokeless tobacco: Never Used  Substance and Sexual Activity  . Alcohol use: No    Alcohol/week: 0.0 standard drinks    Comment: rarely  . Drug use: No  . Sexual activity: Not on file  Other Topics Concern  . Not on file   Social History Narrative  . Not on file   Social Determinants of Health   Financial Resource Strain:   . Difficulty of Paying Living Expenses: Not on file  Food Insecurity:   . Worried About Charity fundraiser in the Last Year: Not on file  . Ran Out of Food in the Last Year: Not on file  Transportation Needs:   . Lack of Transportation (Medical): Not on file  . Lack of Transportation (Non-Medical): Not on file  Physical Activity:   . Days of Exercise per Week: Not on file  . Minutes of Exercise per Session: Not on file  Stress:   . Feeling of Stress : Not on file  Social Connections:   . Frequency of Communication with Friends and Family: Not on file  . Frequency of Social Gatherings with Friends and Family: Not on file  . Attends Religious Services: Not on file  . Active Member of Clubs or Organizations: Not on file  . Attends Archivist Meetings: Not on file  . Marital Status: Not on file  Intimate Partner Violence:   . Fear of Current or Ex-Partner: Not on file  . Emotionally Abused: Not on file  . Physically Abused: Not on file  . Sexually Abused: Not on file    No Known Allergies  Family History  Problem Relation Age of Onset  . Coronary artery disease Other     Prior to Admission medications   Medication Sig Start Date End Date Taking? Authorizing Provider  amLODipine (NORVASC) 5 MG tablet Take 5 mg by mouth daily. 02/17/20  Yes [provider]  atorvastatin (LIPITOR) 80 MG tablet Take 80 mg by mouth at bedtime.    Yes [provider]  losartan (COZAAR) 100 MG tablet Take 100 mg by mouth daily.   Yes [provider]  metFORMIN (GLUCOPHAGE) 1000 MG tablet Take 1,000 mg by mouth 2 (two) times daily with a meal.    Yes [provider]  NOVOLIN 70/30 (70-30) 100 UNIT/ML injection Inject 45 Units into the skin 2 (two) times daily with a meal.  02/25/16  Yes [provider]  omeprazole (PRILOSEC) 40 MG capsule Take  40 mg by mouth daily.   Yes [provider]  Oxycodone HCl 10 MG TABS Take 10 mg by mouth every 4 (four) hours as needed (for pain).  02/22/20  Yes [provider]  tadalafil (CIALIS) 5 MG tablet Take 5 mg by  mouth daily as needed for erectile dysfunction.   Yes [provider]  tamsulosin (FLOMAX) 0.4 MG CAPS capsule Take 0.4 mg by mouth daily. 12/28/19  Yes [provider]  VICTOZA 18 MG/3ML SOPN Inject 1.2 mg into the skin daily. 02/10/20  Yes [provider]  oxyCODONE (OXY IR/ROXICODONE) 5 MG immediate release tablet Take 1-2 tablets (5-10 mg total) by mouth every 4 (four) hours as needed for moderate pain. Patient not taking: Reported on 02/26/2020 06/16/16   Armandina Gemma, MD  oxyCODONE (ROXICODONE) 5 MG immediate release tablet Take 1 tablet (5 mg total) by mouth every 4 (four) hours as needed for severe pain. Patient not taking: Reported on 02/26/2020 06/15/16   Kinsinger, Arta Bruce, MD    Physical Exam: Vitals:   02/26/20 0515 02/26/20 0529 02/26/20 0530 02/26/20 0627  BP: (!) 141/85 (!) 141/85 106/88 106/88  Pulse: 85 81 82 84  Resp: (!) 30 20 10  (!) 33  Temp:  99.8 F (37.7 C)    TempSrc:  Oral  Oral  SpO2: 98% 97% 97% 95%  Weight:      Height:         . General:  Appears calm and comfortable and is NAD; he realized that he had once again voided on the sheets . Eyes:  PERRL, EOMI, normal lids, iris . ENT:  grossly normal hearing, lips & tongue, mmm; appropriate dentition . Neck:  no LAD, masses or thyromegaly . Cardiovascular:  RRR, no m/r/g. No LE edema.  Marland Kitchen Respiratory:   CTA bilaterally with no wheezes/rales/rhonchi.  Normal respiratory effort. . Abdomen:  soft, NT, ND, NABS . Skin:  no rash or induration seen on limited exam . Musculoskeletal:  grossly normal tone BUE/BLE, good ROM, no bony abnormality . Psychiatric:  Somewhat atypical mood and affect, speech fluent and appropriate but with frequent loss of thought process and word  finding difficulty, AOx3 . Neurologic:  CN 2-12 grossly intact, moves all extremities in coordinated fashion    Radiological Exams on Admission: CT Head Wo Contrast  Result Date: 02/26/2020 CLINICAL DATA:  Altered mental status EXAM: CT HEAD WITHOUT CONTRAST TECHNIQUE: Contiguous axial images were obtained from the base of the skull through the vertex without intravenous contrast. COMPARISON:  Apr 28, 2016 FINDINGS: Brain: No evidence of acute territorial infarction, hemorrhage, hydrocephalus,extra-axial collection or mass lesion/mass effect. There is dilatation the ventricles and sulci consistent with age-related atrophy. Low-attenuation changes in the deep white matter consistent with small vessel ischemia. Vascular: No hyperdense vessel or unexpected calcification. Skull: The skull is intact. No fracture or focal lesion identified. Sinuses/Orbits: The visualized paranasal sinuses and mastoid air cells are clear. The orbits and globes intact. Other: None IMPRESSION: No acute intracranial abnormality. Findings consistent with age related atrophy and chronic small vessel ischemia Electronically Signed   By: Prudencio Pair M.D.   On: 02/26/2020 06:01   CT Chest W Contrast  Result Date: 02/26/2020 CLINICAL DATA:  Pneumonia, fusion, or abscess suspected. EXAM: CT CHEST, ABDOMEN, AND PELVIS WITH CONTRAST TECHNIQUE: Multidetector CT imaging of the chest, abdomen and pelvis was performed following the standard protocol during bolus administration of intravenous contrast. CONTRAST:  1100mL OMNIPAQUE IOHEXOL 300 MG/ML  SOLN COMPARISON:  One-view chest x-ray 02/26/2020. CT of the abdomen and pelvis 12/28/2012. FINDINGS: CT CHEST FINDINGS Cardiovascular: Heart size upper limits of normal. Coronary artery calcifications are present. Calcifications are present at the aortic arch without aneurysm or stenosis. Pulmonary arteries are within normal limits. Mediastinum/Nodes: No significant  mediastinum, hilar, or axillary  adenopathy is present. Moderate-sized hiatal hernia is again seen. The esophagus is normal. Thoracic inlet is normal. Lungs/Pleura: Medial posterior right lower lobe airspace opacities are present. Minimal atelectasis present at the right base. No other focal nodule, mass lesion, or airspace disease is present. No significant pleural effusions are present. Musculoskeletal: Fused anterior osteophytes are present. Vertebral body heights and alignment are maintained. No focal lytic or blastic lesions are present. Ribs are within normal limits bilaterally. CT ABDOMEN PELVIS FINDINGS Hepatobiliary: No focal liver abnormality is seen. Status post cholecystectomy. No biliary dilatation. Pancreas: Unremarkable. No pancreatic ductal dilatation or surrounding inflammatory changes. Spleen: Normal in size without focal abnormality. Adrenals/Urinary Tract: 10 mm nonobstructing stone is present at the lower pole of the right kidney. Two 3 mm nonobstructing stones are present at the lower pole of the left kidney. No other focal lesions are present. The ureters are within normal limits bilaterally. The urinary bladder is normal. Stomach/Bowel: Small hiatal hernia is present. The stomach and duodenum are otherwise normal. Small bowel is unremarkable. Terminal ileum is within normal limits. The appendix is visualized and normal. The ascending and transverse colon are within normal limits. Diverticular changes are present within the descending and sigmoid colon. No associated inflammatory changes are present to suggest diverticulitis. Vascular/Lymphatic: Atherosclerotic calcifications are present in the aorta and branch vessels without aneurysm. No significant adenopathy is present. Reproductive: Status post hysterectomy. No adnexal masses. Other: Prominent lower ventral abdominal hernia measures over 11 cm across. Large and small bowel extend into the hernia without obstruction. Musculoskeletal: Solid lumbar fusion is present at L3-4  and L4-5. Wide laminectomy is present at L4. Vertebral body heights are maintained. Hardware is intact. Degenerative changes are present at the SI joints, right greater than left. IMPRESSION: 1. Medial right lower lobe airspace disease concerning for pneumonia. 2. Bilateral nonobstructing nephrolithiasis. 3. Descending and sigmoid diverticulosis without diverticulitis. 4. Large lower ventral abdominal hernia contains large and small bowel without obstruction. 5. Fusion of the lumbar spine at L3-4 and L4-5 with wide laminectomy at L4. 6. Aortic Atherosclerosis (ICD10-I70.0). Electronically Signed   By: San Morelle M.D.   On: 02/26/2020 06:37   CT ABDOMEN PELVIS W CONTRAST  Result Date: 02/26/2020 CLINICAL DATA:  Pneumonia, fusion, or abscess suspected. EXAM: CT CHEST, ABDOMEN, AND PELVIS WITH CONTRAST TECHNIQUE: Multidetector CT imaging of the chest, abdomen and pelvis was performed following the standard protocol during bolus administration of intravenous contrast. CONTRAST:  144mL OMNIPAQUE IOHEXOL 300 MG/ML  SOLN COMPARISON:  One-view chest x-ray 02/26/2020. CT of the abdomen and pelvis 12/28/2012. FINDINGS: CT CHEST FINDINGS Cardiovascular: Heart size upper limits of normal. Coronary artery calcifications are present. Calcifications are present at the aortic arch without aneurysm or stenosis. Pulmonary arteries are within normal limits. Mediastinum/Nodes: No significant mediastinum, hilar, or axillary adenopathy is present. Moderate-sized hiatal hernia is again seen. The esophagus is normal. Thoracic inlet is normal. Lungs/Pleura: Medial posterior right lower lobe airspace opacities are present. Minimal atelectasis present at the right base. No other focal nodule, mass lesion, or airspace disease is present. No significant pleural effusions are present. Musculoskeletal: Fused anterior osteophytes are present. Vertebral body heights and alignment are maintained. No focal lytic or blastic lesions are  present. Ribs are within normal limits bilaterally. CT ABDOMEN PELVIS FINDINGS Hepatobiliary: No focal liver abnormality is seen. Status post cholecystectomy. No biliary dilatation. Pancreas: Unremarkable. No pancreatic ductal dilatation or surrounding inflammatory changes. Spleen: Normal in size without focal abnormality. Adrenals/Urinary Tract:  10 mm nonobstructing stone is present at the lower pole of the right kidney. Two 3 mm nonobstructing stones are present at the lower pole of the left kidney. No other focal lesions are present. The ureters are within normal limits bilaterally. The urinary bladder is normal. Stomach/Bowel: Small hiatal hernia is present. The stomach and duodenum are otherwise normal. Small bowel is unremarkable. Terminal ileum is within normal limits. The appendix is visualized and normal. The ascending and transverse colon are within normal limits. Diverticular changes are present within the descending and sigmoid colon. No associated inflammatory changes are present to suggest diverticulitis. Vascular/Lymphatic: Atherosclerotic calcifications are present in the aorta and branch vessels without aneurysm. No significant adenopathy is present. Reproductive: Status post hysterectomy. No adnexal masses. Other: Prominent lower ventral abdominal hernia measures over 11 cm across. Large and small bowel extend into the hernia without obstruction. Musculoskeletal: Solid lumbar fusion is present at L3-4 and L4-5. Wide laminectomy is present at L4. Vertebral body heights are maintained. Hardware is intact. Degenerative changes are present at the SI joints, right greater than left. IMPRESSION: 1. Medial right lower lobe airspace disease concerning for pneumonia. 2. Bilateral nonobstructing nephrolithiasis. 3. Descending and sigmoid diverticulosis without diverticulitis. 4. Large lower ventral abdominal hernia contains large and small bowel without obstruction. 5. Fusion of the lumbar spine at L3-4 and  L4-5 with wide laminectomy at L4. 6. Aortic Atherosclerosis (ICD10-I70.0). Electronically Signed   By: San Morelle M.D.   On: 02/26/2020 06:37   DG Chest Port 1 View  Result Date: 02/26/2020 CLINICAL DATA:  Shortness of breath EXAM: PORTABLE CHEST 1 VIEW COMPARISON:  12/21/2015 FINDINGS: Cardiac shadow is enlarged but stable. Tortuous thoracic aorta is noted. The lungs are well aerated bilaterally. Elevation of the right hemidiaphragm is noted. IMPRESSION: No active disease. Electronically Signed   By: Inez Catalina M.D.   On: 02/26/2020 03:41    EKG: Independently reviewed.  NSR with rate 85; no evidence of acute ischemia   Labs on Admission: I have personally reviewed the available labs and imaging studies at the time of the admission.  Pertinent labs:   Glucose 194 AST 51/ALT 54 Lactate 1.5 WBC 9.3 Hgb 12.9 INR 1.1 BD COVID negative, Hologic COVID pending UA: 150 glucose; moderate Hgb; 30 protein; ?50 RBC; few bacteria  Assessment/Plan Principal Problem:   Acute metabolic encephalopathy Active Problems:   Uncontrolled type 2 diabetes mellitus with hyperglycemia, with long-term current use of insulin (HCC)   Dyslipidemia   Class 1 obesity with body mass index (BMI) of 33.0 to 33.9 in adult   Essential hypertension   Sleep apnea   Sepsis due to undetermined organism ([place])   Chronic pain   AMS -Patient presenting with encephalopathy as evidenced by his difficulty answering questions and having effective conversation -While the patient does have mild cognitive impairment that his wife attributes to his chronic pain medications, this is a change compared to his usual baseline mental status -Evaluation thus far unremarkable but UA is mildly abnormal and patient has symptoms of dysuria, frequency and incontinence and so UTI appears to be the most likely source; urine culture is pending. -Medication misadventure may also be a contributing factor -Based on unremarkable  evaluation with current ability to protect his airway, will observe for now with IVF hydration and telemetry monitoring -50% of patients with delirium while hospitalized will be institutionalized at 6 months, and these patients have a 25% mortality at 6 months -The family would benefit from being referred to  the [place] [facility] [employer] [facility] and also provided with the [employer] website -If his MS does not clear with abx and IVF, consider MRI for further evaluation  Sepsis, possibly due to UTI -SIRS criteria in this patient includes: Fever, tachypnea -Patient has evidence of acute organ failure with encephalopathy that is not easily explained by another condition. -While awaiting blood cultures, this appears to be a preseptic condition. -Sepsis protocol initiated -Suspected source is UTI (has frequency, incontinence, urgency, dysuria), although aspiration PNA is a secondary consideration (see below) -Blood and urine cultures pending -Will place in observation status with telemetry and continue to monitor -Treat with IV Rocephin for presumed urinary source -Will order lower respiratory tract procalcitonin level.  Antibiotics would not be indicated for PCT <0.1 and probably should not be used for < 0.25.  >0.5 indicates infection and >>0.5 indicates more serious disease.  As the procalcitonin level normalizes, it will be reasonable to consider de-escalation of antibiotic coverage. -Continue Flomax  Possible aspiration -His wife reported an episode of coughing and n/v yesterday and CXR shows possible RLL PNA -He has mild tachypnea but no other respiratory symptoms -He is not hypoxic and appears comfortable on RA -For now, will continue to monitor and continue Rocephin (has reasonable anaerobic coverage) -Will allow diet, but will request ST swallow and cognitive evaluations  DM -A1c is 7.9, indicating poor control -hold Glucophage, Victoza -Continue 70/30 insulin -Cover with moderate-scale  SSI  Chronic pain -I have reviewed this patient in the [place] Controlled Substances Reporting System.  He is receiving medications from only one provider and appears to be taking them as prescribed. -I have reviewed this patient in the [place] Controlled Substances Reporting System.  He is receiving medications from two providers but appears to be taking them as prescribed. -He is not at particularly high risk of overdose but does appear to be at increased risk for opioid misuse or diversion. -Continue Oxycodone prn at this time  HTN -Hold Norvasc, Cozaar due to borderline BPs while in the ER  HLD -Continue Lipitor  Obesity -BMI 33.64 -Weight loss should be encouraged  OSA -Patient uses CPAP and this will be continued   Note: This patient has been tested and is negative for the novel coronavirus COVID-19.    DVT prophylaxis:  Lovenox Code Status:  Full - confirmed with patient Family Communication:  None present; I spoke with his wife by telephone at the time of the admission Disposition Plan:  Home once clinically improved Consults called: Speech therapy Admission status: Admit - It is my clinical opinion that admission to INPATIENT is reasonable and necessary because of the expectation that this patient will require hospital care that crosses at least 2 midnights to treat this condition based on the medical complexity of the problems presented.  Given the aforementioned information, the predictability of an adverse outcome is felt to be significant.    Karmen Bongo MD [facility]   How to contact the [facility] Attending or Consulting provider [place] or covering provider during after hours [place], for this patient?  1. Check the care team in [facility] and look for a) attending/consulting [facility] provider listed and b) the [facility] team listed 2. Log into [email or web address] and use [place]'s universal password to access. If you do not have the password, please contact the hospital  operator. 3. Locate the [facility] provider you are looking for under [facility] and page to a number that you can be directly reached. 4. If  you still have difficulty reaching the provider, please page the [facility] (Director on Call) for the Hospitalists listed on amion for assistance.   02/26/2020, 10:54 AM

## 2020-02-26 NOTE — ED Notes (Signed)
Spoke with wife and update given

## 2020-02-27 ENCOUNTER — Other Ambulatory Visit: Payer: Self-pay

## 2020-02-27 ENCOUNTER — Inpatient Hospital Stay (HOSPITAL_COMMUNITY): Payer: Medicare HMO

## 2020-02-27 ENCOUNTER — Encounter (HOSPITAL_COMMUNITY): Payer: Self-pay | Admitting: Family Medicine

## 2020-02-27 DIAGNOSIS — Z20822 Contact with and (suspected) exposure to covid-19: Secondary | ICD-10-CM | POA: Diagnosis not present

## 2020-02-27 DIAGNOSIS — I4891 Unspecified atrial fibrillation: Secondary | ICD-10-CM

## 2020-02-27 DIAGNOSIS — Z6833 Body mass index (BMI) 33.0-33.9, adult: Secondary | ICD-10-CM | POA: Diagnosis not present

## 2020-02-27 DIAGNOSIS — K449 Diaphragmatic hernia without obstruction or gangrene: Secondary | ICD-10-CM | POA: Diagnosis present

## 2020-02-27 DIAGNOSIS — E1165 Type 2 diabetes mellitus with hyperglycemia: Secondary | ICD-10-CM | POA: Diagnosis not present

## 2020-02-27 DIAGNOSIS — M1990 Unspecified osteoarthritis, unspecified site: Secondary | ICD-10-CM | POA: Diagnosis present

## 2020-02-27 DIAGNOSIS — Z794 Long term (current) use of insulin: Secondary | ICD-10-CM | POA: Diagnosis not present

## 2020-02-27 DIAGNOSIS — G8929 Other chronic pain: Secondary | ICD-10-CM | POA: Diagnosis present

## 2020-02-27 DIAGNOSIS — G4733 Obstructive sleep apnea (adult) (pediatric): Secondary | ICD-10-CM | POA: Diagnosis not present

## 2020-02-27 DIAGNOSIS — I10 Essential (primary) hypertension: Secondary | ICD-10-CM

## 2020-02-27 DIAGNOSIS — I480 Paroxysmal atrial fibrillation: Secondary | ICD-10-CM | POA: Diagnosis not present

## 2020-02-27 DIAGNOSIS — J690 Pneumonitis due to inhalation of food and vomit: Secondary | ICD-10-CM | POA: Diagnosis not present

## 2020-02-27 DIAGNOSIS — R4182 Altered mental status, unspecified: Secondary | ICD-10-CM | POA: Diagnosis present

## 2020-02-27 DIAGNOSIS — Z87891 Personal history of nicotine dependence: Secondary | ICD-10-CM | POA: Diagnosis not present

## 2020-02-27 DIAGNOSIS — E785 Hyperlipidemia, unspecified: Secondary | ICD-10-CM | POA: Diagnosis present

## 2020-02-27 DIAGNOSIS — E1136 Type 2 diabetes mellitus with diabetic cataract: Secondary | ICD-10-CM | POA: Diagnosis not present

## 2020-02-27 DIAGNOSIS — Z8249 Family history of ischemic heart disease and other diseases of the circulatory system: Secondary | ICD-10-CM | POA: Diagnosis not present

## 2020-02-27 DIAGNOSIS — A419 Sepsis, unspecified organism: Secondary | ICD-10-CM | POA: Diagnosis not present

## 2020-02-27 DIAGNOSIS — N3001 Acute cystitis with hematuria: Secondary | ICD-10-CM | POA: Diagnosis not present

## 2020-02-27 DIAGNOSIS — G9341 Metabolic encephalopathy: Secondary | ICD-10-CM | POA: Diagnosis not present

## 2020-02-27 DIAGNOSIS — K219 Gastro-esophageal reflux disease without esophagitis: Secondary | ICD-10-CM | POA: Diagnosis not present

## 2020-02-27 DIAGNOSIS — E669 Obesity, unspecified: Secondary | ICD-10-CM | POA: Diagnosis present

## 2020-02-27 LAB — GLUCOSE, CAPILLARY
Glucose-Capillary: 167 mg/dL — ABNORMAL HIGH (ref 70–99)
Glucose-Capillary: 237 mg/dL — ABNORMAL HIGH (ref 70–99)
Glucose-Capillary: 225 mg/dL — ABNORMAL HIGH (ref 70–99)
Glucose-Capillary: 158 mg/dL — ABNORMAL HIGH (ref 70–99)

## 2020-02-27 LAB — CBC
HCT: 37.1 % — ABNORMAL LOW (ref 39.0–52.0)
Hemoglobin: 12 g/dL — ABNORMAL LOW (ref 13.0–17.0)
MCH: 27.5 pg (ref 26.0–34.0)
MCHC: 32.3 g/dL (ref 30.0–36.0)
MCV: 85.1 fL (ref 80.0–100.0)
Platelets: 188 10*3/uL (ref 150–400)
RBC: 4.36 MIL/uL (ref 4.22–5.81)
RDW: 14.6 % (ref 11.5–15.5)
WBC: 5.7 10*3/uL (ref 4.0–10.5)
nRBC: 0 % (ref 0.0–0.2)

## 2020-02-27 LAB — BASIC METABOLIC PANEL
Anion gap: 9 (ref 5–15)
BUN: 11 mg/dL (ref 8–23)
CO2: 25 mmol/L (ref 22–32)
Calcium: 9.1 mg/dL (ref 8.9–10.3)
Chloride: 103 mmol/L (ref 98–111)
Creatinine, Ser: 1.13 mg/dL (ref 0.61–1.24)
GFR calc Af Amer: >60 mL/min (ref >60)
GFR calc non Af Amer: >60 mL/min (ref >60)
Glucose, Bld: 173 mg/dL — ABNORMAL HIGH (ref 70–99)
Potassium: 3.7 mmol/L (ref 3.5–5.1)
Sodium: 137 mmol/L (ref 135–145)

## 2020-02-27 LAB — ECHOCARDIOGRAM COMPLETE
Height: 6 in
Weight: 3876.57 [oz_av]

## 2020-02-27 LAB — TSH: TSH: 2.149 u[IU]/mL (ref 0.350–4.500)

## 2020-02-27 LAB — MAGNESIUM: Magnesium: 1.7 mg/dL (ref 1.7–2.4)

## 2020-02-27 MED ORDER — SODIUM CHLORIDE 0.9 % IV SOLN
500.0000 mg | INTRAVENOUS | Status: DC
  Administered 2020-02-27: 500 mg via INTRAVENOUS
  Filled 2020-02-27 (×2): qty 500

## 2020-02-27 MED ORDER — METOPROLOL TARTRATE 12.5 MG HALF TABLET
12.5000 mg | ORAL_TABLET | Freq: Two times a day (BID) | ORAL | Status: DC
  Administered 2020-02-27 – 2020-02-28 (×3): 12.5 mg via ORAL
  Filled 2020-02-27 (×3): qty 1

## 2020-02-27 MED ORDER — METOPROLOL TARTRATE 25 MG PO TABS: 25.0000 mg | ORAL_TABLET | Freq: Two times a day (BID) | ORAL | Status: DC

## 2020-02-27 MED ORDER — METOPROLOL TARTRATE 5 MG/5ML IV SOLN
5.0000 mg | Freq: Once | INTRAVENOUS | Status: AC
  Administered 2020-02-27: 5 mg via INTRAVENOUS
  Filled 2020-02-27: qty 5

## 2020-02-27 MED ORDER — INSULIN ASPART PROT & ASPART (70-30 MIX) 100 UNIT/ML ~~LOC~~ SUSP
50.0000 [IU] | Freq: Two times a day (BID) | SUBCUTANEOUS | Status: DC
  Administered 2020-02-28: 50 [IU] via SUBCUTANEOUS
  Filled 2020-02-27: qty 10

## 2020-02-27 MED ORDER — APIXABAN 5 MG PO TABS
5.0000 mg | ORAL_TABLET | Freq: Two times a day (BID) | ORAL | Status: DC
  Administered 2020-02-27 – 2020-02-28 (×2): 5 mg via ORAL
  Filled 2020-02-27 (×3): qty 1

## 2020-02-27 NOTE — Evaluation (Signed)
Clinical/Bedside Swallow Evaluation Patient Details  Name: Kevin [name] MRN: [id number] Date of Birth: February 21, 1946  Today's Date: 02/27/2020 Time: SLP Start Time (ACUTE ONLY): 1455 SLP Stop Time (ACUTE ONLY): 1518 SLP Time Calculation (min) (ACUTE ONLY): 23 min  Past Medical History:  Past Medical History:  Diagnosis Date  . Diabetes mellitus   . Fall   . FUO (fever of unknown origin) 01/19/2016  . GERD (gastroesophageal reflux disease)   . History of hiatal hernia   . Hypertension   . Occasional tremors   . Prostatitis 01/19/2016  . Sleep apnea    . Last test was before 2007 , not sure name of test site. uses CPAP  . Syncope   . Wears glasses    Past Surgical History:  Past Surgical History:  Procedure Laterality Date  . BACK SURGERY    . CHOLECYSTECTOMY N/A 06/15/2016   Procedure: LAPAROSCOPIC CHOLECYSTECTOMY WITH INTRAOPERATIVE CHOLANGIOGRAM;  Surgeon: Armandina Gemma, MD;  Location: [facility];  Service: General;  Laterality: N/A;  . Colonoscopy  2011  . EYE SURGERY     Cataract 2013  . HIATAL HERNIA REPAIR     x2  . KNEE ARTHROSCOPY     bil  . LUMBAR FUSION  2013  . PATELLA FRACTURE SURGERY     HPI:  Pt is a 74 y.o. male with medical history significant of OSA on CPAP; HTN, and DM who presented with AMS.  He reported that his wife sent him in because she thought he was confused - but he was not sure that he was.  He reported a long-term word finding difficulties and difficulty with remembering things, but was unable to clearly define what has been going on at time of admission. CT of the chest 3/4: Medial right lower lobe airspace disease concerning for pneumonia. CT of the head negative.   Assessment / Plan / Recommendation Clinical Impression  Pt was seen for bedside swallow evaluation due to concerns of him aspirating. Per EMR, pt coughed after eating breakfast. Pt's wife reported that the pt had nausea and dry heaves after breakfast but she denied him having any symptoms of  dysphagia at that time or prior. Oral mechanism exam was [facility] and he presented with adequate, natural dentition. He tolerated all solids and liquids without signs or symptoms of oropharyngeal dysphagia. The possibility of conducting a modified barium swallow study to rule out silent aspiration was discussed with the pt and his wife. Pt stated, "I think I'm fine" but deferred the decision to his wife and she reported that they would not like the study to be conducted at this time. Further skilled SLP services are not therefore not clinically indicated at this time for swallowing.  SLP Visit Diagnosis: Dysphagia, unspecified (R13.10)    Aspiration Risk  No limitations    Diet Recommendation Regular;Thin liquid   Liquid Administration via: Straw;Cup Medication Administration: Whole meds with liquid Supervision: Patient able to self feed Postural Changes: Seated upright at 90 degrees    Other  Recommendations Oral Care Recommendations: Oral care BID   Follow up Recommendations None      Frequency and Duration            Prognosis        Swallow Study   General Date of Onset: 02/26/20 HPI: Pt is a 74 y.o. male with medical history significant of OSA on CPAP; HTN, and DM who presented with AMS.  He reported that his wife sent him in because she  thought he was confused - but he was not sure that he was.  He reported a long-term word finding difficulties and difficulty with remembering things, but was unable to clearly define what has been going on at time of admission. CT of the chest 3/4: Medial right lower lobe airspace disease concerning for pneumonia. CT of the head negative. Type of Study: Bedside Swallow Evaluation Previous Swallow Assessment: None Diet Prior to this Study: Regular;Thin liquids Temperature Spikes Noted: No Respiratory Status: Room air History of Recent Intubation: No Behavior/Cognition: Alert;Cooperative;Pleasant mood Oral Cavity Assessment: Within Functional  Limits Oral Care Completed by SLP: No Oral Cavity - Dentition: Adequate natural dentition Vision: Functional for self-feeding Self-Feeding Abilities: Able to feed self Patient Positioning: Upright in bed;Postural control adequate for testing Baseline Vocal Quality: Normal Volitional Cough: Strong Volitional Swallow: Able to elicit    Oral/Motor/Sensory Function Overall Oral Motor/Sensory Function: Within functional limits   Ice Chips Ice chips: Within functional limits Presentation: Spoon   Thin Liquid Thin Liquid: Within functional limits Presentation: Straw;Spoon    Nectar Thick Nectar Thick Liquid: Not tested   Honey Thick Honey Thick Liquid: Not tested   Puree Puree: Within functional limits Presentation: Spoon   Solid    Therron Sells I. Hardin Negus, [place], [place] Office number [phone] Pager [phone]   Solid: Within functional limits Presentation: Self Fed     Horton Marshall 02/27/2020,4:06 PM

## 2020-02-27 NOTE — Consult Note (Addendum)
Cardiology Consultation:   Patient ID: [name] MRN: [id number]; DOB: 02/28/1946  Admit date: 02/26/2020 Date of Consult: 02/27/2020  Primary Care Provider: Lavone Orn, MD Primary Cardiologist: New to [facility] (Dr. Percival Spanish) Primary Electrophysiologist:  None    Patient Profile:   Kevin [name] is a 74 y.o. male with a history of hypertension, diabetes mellitus, sleep apnea, hiatal hernia, GERD, and chronic back pain but no known cardiac history who is being seen today for the evaluation of atrial fibrillation at the request of Dr. Doristine Bosworth.  History of Present Illness:   Mr. Kevin [name] is a 74 year old male with the above history. No known cardiac history. He does not follow with a Cardiologist. He had a stress test about 30-40 years ago for a work physical but has never had any other cardiac work up.   Patient presented to the ED yesterday via EMS for altered mental mental status. Wife noticed that patient was just acting a little "off" recently. On 3/3, patient was eating a biscuit when he started complaining of abdomina pain and nausea and started to drive heave. Never vomiting. Those symptoms resolved and he was did well throughout the remainder of the day. However, around 2am, his wife found him standing in the laundry room. She asked what was wrong and he could not answer. His wife checked his temperature and it was 101 which is when she called 911.  Upon arrival to the ED, patient febrile with temperature of 101 and tachypneic. EKG showed normal sinus rhythm, rate 85 bpm, with non-specific ST/T changes. Chest x-ray showed no acute findings. Head CT showed no acute findings. Chest/Abdominal/Pelvic CT showed medial right lower lobe airspace disease concerning for pneumonia, bilateral non-obstructing nephrolithiasis, descending and sigmoid diverticulosis without diverticulitis, large lower ventral abdominal hernia without obstruction. WBC 9.3, Hgb 12.9, Plts 196. Na 137, K 4.5, Glucose  194, BUN 12, Cr 1.16. AST 51, ALT 54, Alk Phos 112, Total Bili 1.2. Lactic acid 1.5. Procalcitonin normal. Urinalysis showed moderate hemoglobin, few bacteria, and some glucose and protein. Urine culture showed multiple species and recollection was suggested. COVID-19 negative x2. Patient was admitted with altered mental status and sepsis due to possible UTI and possible aspiration pneumonia.  Patient developed short episode of atrial fibrillation this afternoon. Cardiology was consulted for further evaluation. At the time of this evaluation, patient is resting comfortably. Wife states patient's mental status returned to normal quickly after being started on IV antibiotics. He states he was asymptomatic while in atrial fibrillation. No palpitations. He denies any recent cardiac symptoms. No chest pain, shortness of breath, palpitations, lightheadedness, dizziness, syncope, orthopnea, PND, or edema. No exertional symptoms prior to admission but patient admits that he is not very active due to his chronic back pain. He ambulates with a cain.   He has a remote smoking history but states he quit in the 1980's. He notes rare alcohol use. He does have a family history of CAD. His father had rheumatic fever at the age of 18 and then died from a presumed MI at the age of 16. His sister has heart disease but he is not exactly sure what.   Heart Pathway Score:     Past Medical History:  Diagnosis Date  . Diabetes mellitus   . Fall   . FUO (fever of unknown origin) 01/19/2016  . GERD (gastroesophageal reflux disease)   . History of hiatal hernia   . Hypertension   . Occasional tremors   . Prostatitis  01/19/2016  . Sleep apnea    . Last test was before 2007 , not sure name of test site. uses CPAP  . Syncope   . Wears glasses     Past Surgical History:  Procedure Laterality Date  . BACK SURGERY    . CHOLECYSTECTOMY N/A 06/15/2016   Procedure: LAPAROSCOPIC CHOLECYSTECTOMY WITH INTRAOPERATIVE CHOLANGIOGRAM;   Surgeon: Armandina Gemma, MD;  Location: [facility] ORS;  Service: General;  Laterality: N/A;  . Colonoscopy  2011  . EYE SURGERY     Cataract 2013  . HIATAL HERNIA REPAIR     x2  . KNEE ARTHROSCOPY     bil  . PATELLA FRACTURE SURGERY       Home Medications:  Prior to Admission medications   Medication Sig Start Date End Date Taking? Authorizing Provider  amLODipine (NORVASC) 5 MG tablet Take 5 mg by mouth daily. 02/17/20  Yes [provider]  atorvastatin (LIPITOR) 80 MG tablet Take 80 mg by mouth at bedtime.    Yes [provider]  losartan (COZAAR) 100 MG tablet Take 100 mg by mouth daily.   Yes [provider]  metFORMIN (GLUCOPHAGE) 1000 MG tablet Take 1,000 mg by mouth 2 (two) times daily with a meal.    Yes [provider]  NOVOLIN 70/30 (70-30) 100 UNIT/ML injection Inject 45 Units into the skin 2 (two) times daily with a meal.  02/25/16  Yes [provider]  omeprazole (PRILOSEC) 40 MG capsule Take 40 mg by mouth daily.   Yes [provider]  Oxycodone HCl 10 MG TABS Take 10 mg by mouth every 4 (four) hours as needed (for pain).  02/22/20  Yes [provider]  tadalafil (CIALIS) 5 MG tablet Take 5 mg by mouth daily as needed for erectile dysfunction.   Yes [provider]  tamsulosin (FLOMAX) 0.4 MG CAPS capsule Take 0.4 mg by mouth daily. 12/28/19  Yes [provider]  VICTOZA 18 MG/3ML SOPN Inject 1.2 mg into the skin daily. 02/10/20  Yes [provider]    Inpatient Medications: Scheduled Meds: . acetaminophen  1,000 mg Oral Once  . atorvastatin  80 mg Oral QHS  . docusate sodium  100 mg Oral BID  . enoxaparin (LOVENOX) injection  40 mg Subcutaneous Q24H  . insulin aspart  0-15 Units Subcutaneous TID WC  . insulin aspart  0-5 Units Subcutaneous QHS  . insulin aspart protamine- aspart  45 Units Subcutaneous BID WC  . pantoprazole  40 mg Oral Daily  . sodium chloride flush  3 mL Intravenous Q12H  .  tamsulosin  0.4 mg Oral Daily   Continuous Infusions: . cefTRIAXone (ROCEPHIN)  IV 1 g (02/27/20 1208)  . lactated ringers 100 mL/hr at 02/27/20 1007   PRN Meds: acetaminophen **OR** acetaminophen, ondansetron **OR** ondansetron (ZOFRAN) IV, oxyCODONE, polyethylene glycol  Allergies:   No Known Allergies  Social History:   Social History   Socioeconomic History  . Marital status: Married    Spouse name: Not on file  . Number of children: Not on file  . Years of education: Not on file  . Highest education level: Not on file  Occupational History  . Not on file  Tobacco Use  . Smoking status: Former Smoker    Packs/day: 1.00    Years: 4.00    Pack years: 4.00    Types: Cigarettes    Quit date: 09/04/1980    Years since quitting: 39.5  . Smokeless tobacco: Never Used  Substance and Sexual Activity  . Alcohol use: No    Alcohol/week: 0.0 standard drinks    Comment: rarely  . Drug use: No  . Sexual activity: Not on file  Other Topics Concern  . Not on file  Social History Narrative  . Not on file   Social Determinants of Health   Financial Resource Strain:   . Difficulty of Paying Living Expenses: Not on file  Food Insecurity:   . Worried About Charity fundraiser in the Last Year: Not on file  . Ran Out of Food in the Last Year: Not on file  Transportation Needs:   . Lack of Transportation (Medical): Not on file  . Lack of Transportation (Non-Medical): Not on file  Physical Activity:   . Days of Exercise per Week: Not on file  . Minutes of Exercise per Session: Not on file  Stress:   . Feeling of Stress : Not on file  Social Connections:   . Frequency of Communication with Friends and Family: Not on file  . Frequency of Social Gatherings with Friends and Family: Not on file  . Attends Religious Services: Not on file  . Active Member of Clubs or Organizations: Not on file  . Attends Archivist Meetings: Not on file  . Marital Status: Not on file    Intimate Partner Violence:   . Fear of Current or Ex-Partner: Not on file  . Emotionally Abused: Not on file  . Physically Abused: Not on file  . Sexually Abused: Not on file    Family History:    Family History  Problem Relation Age of Onset  . Coronary artery disease Other      ROS:  Please see the history of present illness.  Review of Systems  Constitutional: Positive for chills and fever.  HENT: Negative for congestion.   Eyes: Negative for blurred vision and double vision.  Respiratory: Negative for cough and shortness of breath.   Cardiovascular: Negative for chest pain, palpitations, orthopnea, leg swelling and PND.  Gastrointestinal: Positive for abdominal pain and nausea. Negative for blood in stool and vomiting.  Genitourinary: Positive for frequency. Negative for hematuria.  Musculoskeletal: Negative for myalgias.  Neurological: Positive for weakness. Negative for dizziness and loss of consciousness.  Endo/Heme/Allergies: Does not bruise/bleed easily.  Psychiatric/Behavioral: Positive for memory loss (altered mental status). Negative for substance abuse.   Physical Exam/Data:   Vitals:   02/27/20 1030 02/27/20 1100 02/27/20 1130 02/27/20 1200  BP: 137/81 126/60 126/75 133/74  Pulse: 81 77 76 76  Resp:      Temp:      TempSrc:      SpO2: 95% 97% 96% 95%  Weight:      Height:        Intake/Output Summary (Last 24 hours) at 02/27/2020 1515 Last data filed at 02/27/2020 0330 Gross per 24 hour  Intake 1513.33 ml  Output 250 ml  Net 1263.33 ml   Last 3 Weights 02/27/2020 02/26/2020 06/15/2016  Weight (lbs) 242 lb 4.6 oz 255 lb 251 lb  Weight (kg) 109.9 kg 115.667 kg 113.853 kg     Body mass index is 4,731.82 kg/m.  General: 74 y.o. male resting comfortably in no acute distress. HEENT: Normocephalic and atraumatic.  Neck: Supple. No carotid bruits. No JVD. Heart: RRR. Distinct S1 and S2. No murmurs, gallops, or rubs. Radial and distal pedal pulses 2+ and equal  bilaterally. Lungs: No increased work of breathing. Clear to ausculation bilaterally. No  wheezes, rhonchi, or rales.  Abdomen: Soft, non-distended, and non-tender to palpation. Bowel sounds present. Extremities: No lower extremity edema.    Skin: Warm and dry. Neuro: Alert and oriented x3. No focal deficits. Psych: Normal affect. Responds appropriately.  EKG:  The EKG was personally reviewed and demonstrates:  Atrial fibrillation with RVR, rate 127 bpm, with conversion to normal sinus rhythm caught on the second half of the tracing. Mild diffuse ST depression noted.  Telemetry:  Telemetry was personally reviewed and demonstrates:  Currently in normal sinus rhythm with rates in the 70's to 80's. Has had 2 brief episodes of atrial fibrillation with longest lasting shortly over 1 hours. Rates as high as the 150's.   Relevant CV Studies:  N/A.  Laboratory Data:  High Sensitivity Troponin:  No results for input(s): TROPONINIHS in the last 720 hours.   Chemistry Recent Labs  Lab 02/26/20 0426 02/27/20 0408  NA 137 137  K 4.5 3.7  CL 102 103  CO2 23 25  GLUCOSE 194* 173*  BUN 12 11  CREATININE 1.16 1.13  CALCIUM 9.4 9.1  GFRNONAA >60 >60  GFRAA >60 >60  ANIONGAP 12 9    Recent Labs  Lab 02/26/20 0426  PROT 7.4  ALBUMIN 3.5  AST 51*  ALT 54*  ALKPHOS 112  BILITOT 1.2   Hematology Recent Labs  Lab 02/26/20 0426 02/27/20 0408  WBC 9.3 5.7  RBC 4.67 4.36  HGB 12.9* 12.0*  HCT 41.0 37.1*  MCV 87.8 85.1  MCH 27.6 27.5  MCHC 31.5 32.3  RDW 14.6 14.6  PLT 196 188   BNPNo results for input(s): BNP, PROBNP in the last 168 hours.  DDimer No results for input(s): DDIMER in the last 168 hours.   Radiology/Studies:  CT Head Wo Contrast  Result Date: 02/26/2020 CLINICAL DATA:  Altered mental status EXAM: CT HEAD WITHOUT CONTRAST TECHNIQUE: Contiguous axial images were obtained from the base of the skull through the vertex without intravenous contrast. COMPARISON:  Apr 28, 2016 FINDINGS: Brain: No evidence of acute territorial infarction, hemorrhage, hydrocephalus,extra-axial collection or mass lesion/mass effect. There is dilatation the ventricles and sulci consistent with age-related atrophy. Low-attenuation changes in the deep white matter consistent with small vessel ischemia. Vascular: No hyperdense vessel or unexpected calcification. Skull: The skull is intact. No fracture or focal lesion identified. Sinuses/Orbits: The visualized paranasal sinuses and mastoid air cells are clear. The orbits and globes intact. Other: None IMPRESSION: No acute intracranial abnormality. Findings consistent with age related atrophy and chronic small vessel ischemia Electronically Signed   By: Prudencio Pair M.D.   On: 02/26/2020 06:01   CT Chest W Contrast  Result Date: 02/26/2020 CLINICAL DATA:  Pneumonia, fusion, or abscess suspected. EXAM: CT CHEST, ABDOMEN, AND PELVIS WITH CONTRAST TECHNIQUE: Multidetector CT imaging of the chest, abdomen and pelvis was performed following the standard protocol during bolus administration of intravenous contrast. CONTRAST:  131m OMNIPAQUE IOHEXOL 300 MG/ML  SOLN COMPARISON:  One-view chest x-ray 02/26/2020. CT of the abdomen and pelvis 12/28/2012. FINDINGS: CT CHEST FINDINGS Cardiovascular: Heart size upper limits of normal. Coronary artery calcifications are present. Calcifications are present at the aortic arch without aneurysm or stenosis. Pulmonary arteries are within normal limits. Mediastinum/Nodes: No significant mediastinum, hilar, or axillary adenopathy is present. Moderate-sized hiatal hernia is again seen. The esophagus is normal. Thoracic inlet is normal. Lungs/Pleura: Medial posterior right lower lobe airspace opacities are present. Minimal atelectasis present at the right base. No other focal nodule, mass lesion,  or airspace disease is present. No significant pleural effusions are present. Musculoskeletal: Fused anterior osteophytes are present.  Vertebral body heights and alignment are maintained. No focal lytic or blastic lesions are present. Ribs are within normal limits bilaterally. CT ABDOMEN PELVIS FINDINGS Hepatobiliary: No focal liver abnormality is seen. Status post cholecystectomy. No biliary dilatation. Pancreas: Unremarkable. No pancreatic ductal dilatation or surrounding inflammatory changes. Spleen: Normal in size without focal abnormality. Adrenals/Urinary Tract: 10 mm nonobstructing stone is present at the lower pole of the right kidney. Two 3 mm nonobstructing stones are present at the lower pole of the left kidney. No other focal lesions are present. The ureters are within normal limits bilaterally. The urinary bladder is normal. Stomach/Bowel: Small hiatal hernia is present. The stomach and duodenum are otherwise normal. Small bowel is unremarkable. Terminal ileum is within normal limits. The appendix is visualized and normal. The ascending and transverse colon are within normal limits. Diverticular changes are present within the descending and sigmoid colon. No associated inflammatory changes are present to suggest diverticulitis. Vascular/Lymphatic: Atherosclerotic calcifications are present in the aorta and branch vessels without aneurysm. No significant adenopathy is present. Reproductive: Status post hysterectomy. No adnexal masses. Other: Prominent lower ventral abdominal hernia measures over 11 cm across. Large and small bowel extend into the hernia without obstruction. Musculoskeletal: Solid lumbar fusion is present at L3-4 and L4-5. Wide laminectomy is present at L4. Vertebral body heights are maintained. Hardware is intact. Degenerative changes are present at the SI joints, right greater than left. IMPRESSION: 1. Medial right lower lobe airspace disease concerning for pneumonia. 2. Bilateral nonobstructing nephrolithiasis. 3. Descending and sigmoid diverticulosis without diverticulitis. 4. Large lower ventral abdominal hernia  contains large and small bowel without obstruction. 5. Fusion of the lumbar spine at L3-4 and L4-5 with wide laminectomy at L4. 6. Aortic Atherosclerosis (ICD10-I70.0). Electronically Signed   By: San Morelle M.D.   On: 02/26/2020 06:37   CT ABDOMEN PELVIS W CONTRAST  Result Date: 02/26/2020 CLINICAL DATA:  Pneumonia, fusion, or abscess suspected. EXAM: CT CHEST, ABDOMEN, AND PELVIS WITH CONTRAST TECHNIQUE: Multidetector CT imaging of the chest, abdomen and pelvis was performed following the standard protocol during bolus administration of intravenous contrast. CONTRAST:  125m OMNIPAQUE IOHEXOL 300 MG/ML  SOLN COMPARISON:  One-view chest x-ray 02/26/2020. CT of the abdomen and pelvis 12/28/2012. FINDINGS: CT CHEST FINDINGS Cardiovascular: Heart size upper limits of normal. Coronary artery calcifications are present. Calcifications are present at the aortic arch without aneurysm or stenosis. Pulmonary arteries are within normal limits. Mediastinum/Nodes: No significant mediastinum, hilar, or axillary adenopathy is present. Moderate-sized hiatal hernia is again seen. The esophagus is normal. Thoracic inlet is normal. Lungs/Pleura: Medial posterior right lower lobe airspace opacities are present. Minimal atelectasis present at the right base. No other focal nodule, mass lesion, or airspace disease is present. No significant pleural effusions are present. Musculoskeletal: Fused anterior osteophytes are present. Vertebral body heights and alignment are maintained. No focal lytic or blastic lesions are present. Ribs are within normal limits bilaterally. CT ABDOMEN PELVIS FINDINGS Hepatobiliary: No focal liver abnormality is seen. Status post cholecystectomy. No biliary dilatation. Pancreas: Unremarkable. No pancreatic ductal dilatation or surrounding inflammatory changes. Spleen: Normal in size without focal abnormality. Adrenals/Urinary Tract: 10 mm nonobstructing stone is present at the lower pole of the  right kidney. Two 3 mm nonobstructing stones are present at the lower pole of the left kidney. No other focal lesions are present. The ureters are within normal limits bilaterally. The urinary  bladder is normal. Stomach/Bowel: Small hiatal hernia is present. The stomach and duodenum are otherwise normal. Small bowel is unremarkable. Terminal ileum is within normal limits. The appendix is visualized and normal. The ascending and transverse colon are within normal limits. Diverticular changes are present within the descending and sigmoid colon. No associated inflammatory changes are present to suggest diverticulitis. Vascular/Lymphatic: Atherosclerotic calcifications are present in the aorta and branch vessels without aneurysm. No significant adenopathy is present. Reproductive: Status post hysterectomy. No adnexal masses. Other: Prominent lower ventral abdominal hernia measures over 11 cm across. Large and small bowel extend into the hernia without obstruction. Musculoskeletal: Solid lumbar fusion is present at L3-4 and L4-5. Wide laminectomy is present at L4. Vertebral body heights are maintained. Hardware is intact. Degenerative changes are present at the SI joints, right greater than left. IMPRESSION: 1. Medial right lower lobe airspace disease concerning for pneumonia. 2. Bilateral nonobstructing nephrolithiasis. 3. Descending and sigmoid diverticulosis without diverticulitis. 4. Large lower ventral abdominal hernia contains large and small bowel without obstruction. 5. Fusion of the lumbar spine at L3-4 and L4-5 with wide laminectomy at L4. 6. Aortic Atherosclerosis (ICD10-I70.0). Electronically Signed   By: San Morelle M.D.   On: 02/26/2020 06:37   DG Chest Port 1 View  Result Date: 02/26/2020 CLINICAL DATA:  Shortness of breath EXAM: PORTABLE CHEST 1 VIEW COMPARISON:  12/21/2015 FINDINGS: Cardiac shadow is enlarged but stable. Tortuous thoracic aorta is noted. The lungs are well aerated  bilaterally. Elevation of the right hemidiaphragm is noted. IMPRESSION: No active disease. Electronically Signed   By: Inez Catalina M.D.   On: 02/26/2020 03:41    Assessment and Plan:   Paroxysmal Atrial Fibrillation  - Patient presented with altered mental status and was admitted for sepsis secondary to possible UTI and/or pneumonia. Developed two episodes of atrial fibrillation with RVR today with rates as high as the 150's. Longest episode lasted shortly over 1 hour. Converted to normal sinus rhythm with IV Metoprolol. - Currently maintaining sinus rhythm at this time. Rates in the 70's. - Potassium 3.7.  - Will check Magnesium and TSH.  - Will check Echo. - Will start Lopressor 12.50m twice daily. - CHA2DS2-VASc = x3 (HTN, DM, age). Will start Eliquis 53mtwice daily.   Hypertension - BP currently well controlled but elevated at times.  - Home medications include Amlodipine 41m2maily and Losartan 100m9mily. However, both of these are currently on hold. - Will add Lopressor as above given atrial fibrillation.  - Can add back home medications if needed. Would add back Losartan first given history of diabetes.   Hyperlipidemia - Continue home Lipitor 80mg23mly.   Type 2 Diabetes Mellitus - Hemoglobin A1c 7.9 this admission. - Management per primary team.   Sepsis - Secondary to possible UTI vs pneumonia. WBC, lactic acid, and procalcitonin normal. COVID negative. - Currently on Ceftriaxone.  - Management per primary team.   Otherwise, per primary team.   For questions or updates, please contact [facility] consult [email or web address] for contact info under     Signed, CalliDarreld McleanC  02/27/2020 3:15 PM As above, patient seen and examined.  Briefly he is a 73 ye22 old male with past medical history of hypertension, diabetes mellitus, sleep apnea, Gastrosoft reflux disease chronic back pain admitted with encephalopathy secondary to sepsis for evaluation of  atrial fibrillation. Pt admitted with encephalopathy yesterday morning as well as fever. Chest CT showed right lower lobe airspace disease.  Patient treated with antibiotics  with improvement.  On telemetry today he was noted to have atrial fibrillation with rapid ventricular response that then converted back to sinus rhythm.  Cardiology asked to evaluate.  Electrocardiogram shows sinus rhythm with nonspecific ST changes.  1 paroxysmal atrial fibrillation-patient has converted back to sinus rhythm.  He is completely asymptomatic with no history of palpitations, increased dyspnea or chest pain.  His atrial fibrillation may have been secondary to the stress of his ongoing infection.  However he has already defervesced and his mentation has normalized.  He could also be having atrial fibrillation at home given that he has no symptoms with his arrhythmia.  We will add metoprolol to assist with rate control.  Embolic risk factors include age greater than 63, diabetes mellitus and hypertension.  Discontinue aspirin.  Add apixaban 5 mg twice daily.  Check echocardiogram and TSH.  2 hypertension-blood pressure controlled.  Continue present medical regimen.  3 sepsis-question secondary to pneumonia.  Antibiotics per primary care.  Kirk Ruths, MD

## 2020-02-27 NOTE — Progress Notes (Signed)
  Echocardiogram 2D Echocardiogram has been performed.  Kevin [name] 02/27/2020, 5:05 PM

## 2020-02-27 NOTE — Progress Notes (Signed)
PROGRESS NOTE    Kevin [name]  [id number] DOB: 1946-08-13 DOA: 02/26/2020 PCP: Lavone Orn, MD   Brief Narrative:  HPI: Kevin [name] is a 74 y.o. male with medical history significant of OSA on CPAP; HTN; and DM presenting with AMS.  He reports that his wife sent him in because she thought he was confused - but he is not sure that he is.  He reports long-term word finding difficulties and difficulty with remembering things, but is unable to clearly define what has been going on.   I spoke with his wife.  She reports that yesterday, he ate around 11 and had coffee and a banana.  About 12pm, he ate a sausage biscuit.  He got an upset stomach shortly thereafter; they thought maybe the sandwich was not good.  He was nauseated with dry heaves and had a fever.  He was very weak.  She gave him Tums, ginger ale, saltines.  Maybe an hour later, he seemed to feel better.  About 1AM, she found that he had urinated on the floor and was leaning on the sink.  He wasn't really speaking.  She brought him a chair and found that he had a fever to 101.  She decided to send him to the ER and got him dressed.  Then he refused to go to the ER.  He didn't seem to be processing the information.  She called 911. He does "talk out of his mind a little bit" when he gets infections.  He also had hives on his upper torso.  She reports that he takes a lot of medications due to a failed back surgery and so he does not remember things as well as prior.  "His cognition is okay, he can drive.... I am not 100% concerned about his cognition per se except when he is sick."  Much of this may be associated with his pain meds as well as the added infection. He has been having increased urinary frequency recently.  He has a "special cup that he used" when he has incontinence.   ED Course:  AMS.  ?UTI with PNA - no cough but disoriented (no underlying dementia).  Ate breakfast and coughed, ?aspirated and then really confused - peed  in the floor.  Febrile to 101+, tachypnea.  UA mildly abnormal.  CT with RLL infiltrate.  BD COVID negative.  Sepsis eval started, given Vanc, Cefepime.  Assessment & Plan:   Principal Problem:   Acute metabolic encephalopathy Active Problems:   Uncontrolled type 2 diabetes mellitus with hyperglycemia, with long-term current use of insulin (HCC)   Dyslipidemia   Class 1 obesity with body mass index (BMI) of 33.0 to 33.9 in adult   Essential hypertension   Sleep apnea   Sepsis due to undetermined organism ([place])   Chronic pain   Acute metabolic encephalopathy: Likely secondary to sepsis.  Patient is completely alert and oriented.  Treat underlying cause.    Sepsis secondary to community-acquired pneumonia and UTI: CT chest right lower lobe infiltrate.  UA positive for UTI.  Sepsis parameters resolved.  Hemodynamically stable.  Procalcitonin slightly elevated at 0.38.  Afebrile.  Continue Rocephin.  Add azithromycin.  Follow culture and repeat CBC in the morning. Some question for possible aspiration pneumonia.  SLP consulted.  Type 2 diabetes mellitus: Blood sugar slightly elevated.  Hemoglobin A1c 7.9.  Will increase 70/30 to 50 units twice daily and continue SSI.  New onset atrial fibrillation: Patient developed new onset  atrial fibrillation.  Confirmed with EKG.  Provided with 1 dose of IV Lopressor 5 mg.  He converted to sinus rhythm.  Cardiology consulted.  He is started on Lopressor 12.5 mg twice daily.  Decision of starting on DOAC defer to cardiology.  Chronic pain: Continue current pain medication.  Essential hypertension: Controlled.  Continue current regimen.  HLD -Continue Lipitor  Obesity -BMI 33.64 -Weight loss should be encouraged  OSA -Patient uses CPAP and this will be continued  DVT prophylaxis: Lovenox   Code Status: Full Code  Family Communication:  None present at bedside.  Plan of care discussed with patient in length and he verbalized understanding  and agreed with it. Patient is from: Home Disposition Plan: Likely home tomorrow Barriers to discharge: IV antibiotics, monitoring for atrial fibrillation   Estimated body mass index is 4,731.82 kg/m as calculated from the following:   Height as of this encounter: 6" (0.152 m).   Weight as of this encounter: 109.9 kg.      Nutritional status:               Consultants:   Cardiology  Procedures:   None  Antimicrobials:   Rocephin and Zithromax   Subjective: Patient seen and examined.  No complaints.  Alert and oriented.  Denied any chest pain or shortness of breath.  Objective: Vitals:   02/27/20 1030 02/27/20 1100 02/27/20 1130 02/27/20 1200  BP: 137/81 126/60 126/75 133/74  Pulse: 81 77 76 76  Resp:      Temp:      TempSrc:      SpO2: 95% 97% 96% 95%  Weight:      Height:        Intake/Output Summary (Last 24 hours) at 02/27/2020 1522 Last data filed at 02/27/2020 0330 Gross per 24 hour  Intake 1513.33 ml  Output 250 ml  Net 1263.33 ml   Filed Weights   02/26/20 0415 02/27/20 0000  Weight: 115.7 kg 109.9 kg    Examination:  General exam: Appears calm and comfortable  Respiratory system: Rhonchi bilaterally. Respiratory effort normal. Cardiovascular system: S1 & S2 heard, irregularly irregular rate and rhythm no JVD, murmurs, rubs, gallops or clicks. No pedal edema. Gastrointestinal system: Abdomen is nondistended, soft and nontender. No organomegaly or masses felt. Normal bowel sounds heard. Central nervous system: Alert and oriented. No focal neurological deficits. Extremities: Symmetric 5 x 5 power. Skin: No rashes, lesions or ulcers Psychiatry: Judgement and insight appear normal. Mood & affect appropriate.    Data Reviewed: I have personally reviewed following labs and imaging studies  CBC: Recent Labs  Lab 02/26/20 0426 02/27/20 0408  WBC 9.3 5.7  NEUTROABS 8.0*  --   HGB 12.9* 12.0*  HCT 41.0 37.1*  MCV 87.8 85.1  PLT 196  0000000   Basic Metabolic Panel: Recent Labs  Lab 02/26/20 0426 02/27/20 0408  NA 137 137  K 4.5 3.7  CL 102 103  CO2 23 25  GLUCOSE 194* 173*  BUN 12 11  CREATININE 1.16 1.13  CALCIUM 9.4 9.1   GFR: CrCl cannot be calculated (Unknown ideal weight.). Liver Function Tests: Recent Labs  Lab 02/26/20 0426  AST 51*  ALT 54*  ALKPHOS 112  BILITOT 1.2  PROT 7.4  ALBUMIN 3.5   No results for input(s): LIPASE, AMYLASE in the last 168 hours. No results for input(s): AMMONIA in the last 168 hours. Coagulation Profile: Recent Labs  Lab 02/26/20 0426  INR 1.1   Cardiac Enzymes: No  results for input(s): CKTOTAL, CKMB, CKMBINDEX, TROPONINI in the last 168 hours. BNP (last 3 results) No results for input(s): PROBNP in the last 8760 hours. HbA1C: Recent Labs    02/26/20 0426  HGBA1C 7.9*   CBG: Recent Labs  Lab 02/26/20 1147 02/26/20 1650 02/26/20 2015 02/27/20 1020 02/27/20 1229  GLUCAP 182* 180* 191* 167* 237*   Lipid Profile: No results for input(s): CHOL, HDL, LDLCALC, TRIG, CHOLHDL, LDLDIRECT in the last 72 hours. Thyroid Function Tests: No results for input(s): TSH, T4TOTAL, FREET4, T3FREE, THYROIDAB in the last 72 hours. Anemia Panel: No results for input(s): VITAMINB12, FOLATE, FERRITIN, TIBC, IRON, RETICCTPCT in the last 72 hours. Sepsis Labs: Recent Labs  Lab 02/26/20 0426  PROCALCITON 0.36  LATICACIDVEN 1.5    Recent Results (from the past 240 hour(s))  Urine culture     Status: Abnormal   Collection Time: 02/26/20  3:51 AM   Specimen: In/Out Cath Urine  Result Value Ref Range Status   Specimen Description IN/OUT CATH URINE  Final   Special Requests   Final    NONE Performed at [facility], [place] [address], [place], [place] [address]    Culture MULTIPLE SPECIES PRESENT, SUGGEST RECOLLECTION (A)  Final   Report Status 02/26/2020 FINAL  Final  Blood Culture (routine x 2)     Status: None (Preliminary result)   Collection Time: 02/26/20   4:08 AM   Specimen: BLOOD  Result Value Ref Range Status   Specimen Description BLOOD RIGHT ANTECUBITAL  Final   Special Requests   Final    BOTTLES DRAWN AEROBIC AND ANAEROBIC Blood Culture results may not be optimal due to an inadequate volume of blood received in culture bottles   Culture   Final    NO GROWTH 1 DAY Performed at [facility], [place] [address], [place], [place] [address]    Report Status PENDING  Incomplete  Blood Culture (routine x 2)     Status: None (Preliminary result)   Collection Time: 02/26/20  4:26 AM   Specimen: BLOOD  Result Value Ref Range Status   Specimen Description BLOOD RIGHT ANTECUBITAL  Final   Special Requests   Final    BOTTLES DRAWN AEROBIC AND ANAEROBIC Blood Culture adequate volume   Culture   Final    NO GROWTH 1 DAY Performed at [facility], [place] [address], [place],  [address]    Report Status PENDING  Incomplete  SARS CORONAVIRUS 2 (TAT 6-24 HRS) Nasopharyngeal Nasopharyngeal Swab     Status: None   Collection Time: 02/26/20  5:48 AM   Specimen: Nasopharyngeal Swab  Result Value Ref Range Status   SARS Coronavirus 2 NEGATIVE NEGATIVE Final    Comment: (NOTE) SARS-CoV-2 target nucleic acids are NOT DETECTED. The SARS-CoV-2 RNA is generally detectable in upper and lower respiratory specimens during the acute phase of infection. Negative results do not preclude SARS-CoV-2 infection, do not rule out co-infections with other pathogens, and should not be used as the sole basis for treatment or other patient management decisions. Negative results must be combined with clinical observations, patient history, and epidemiological information. The expected result is Negative. Fact Sheet for Patients: SugarRoll.be Fact Sheet for Healthcare Providers: https://www.woods-mathews.com/ This test is not yet approved or cleared by the [place] FDA and  has been authorized for detection  and/or diagnosis of SARS-CoV-2 by FDA under an Emergency Use Authorization (EUA). This EUA will remain  in effect (meaning this test can  be used) for the duration of the COVID-19 declaration under Section 56 4(b)(1) of the Act, 21 U.S.C. section 360bbb-3(b)(1), unless the authorization is terminated or revoked sooner. Performed at [facility], [place] [address], [place], [place] [address]       Radiology Studies: CT Head Wo Contrast  Result Date: 02/26/2020 CLINICAL DATA:  Altered mental status EXAM: CT HEAD WITHOUT CONTRAST TECHNIQUE: Contiguous axial images were obtained from the base of the skull through the vertex without intravenous contrast. COMPARISON:  Apr 28, 2016 FINDINGS: Brain: No evidence of acute territorial infarction, hemorrhage, hydrocephalus,extra-axial collection or mass lesion/mass effect. There is dilatation the ventricles and sulci consistent with age-related atrophy. Low-attenuation changes in the deep white matter consistent with small vessel ischemia. Vascular: No hyperdense vessel or unexpected calcification. Skull: The skull is intact. No fracture or focal lesion identified. Sinuses/Orbits: The visualized paranasal sinuses and mastoid air cells are clear. The orbits and globes intact. Other: None IMPRESSION: No acute intracranial abnormality. Findings consistent with age related atrophy and chronic small vessel ischemia Electronically Signed   By: Prudencio Pair M.D.   On: 02/26/2020 06:01   CT Chest W Contrast  Result Date: 02/26/2020 CLINICAL DATA:  Pneumonia, fusion, or abscess suspected. EXAM: CT CHEST, ABDOMEN, AND PELVIS WITH CONTRAST TECHNIQUE: Multidetector CT imaging of the chest, abdomen and pelvis was performed following the standard protocol during bolus administration of intravenous contrast. CONTRAST:  128mL OMNIPAQUE IOHEXOL 300 MG/ML  SOLN COMPARISON:  One-view chest x-ray 02/26/2020. CT of the abdomen and pelvis 12/28/2012. FINDINGS: CT CHEST FINDINGS  Cardiovascular: Heart size upper limits of normal. Coronary artery calcifications are present. Calcifications are present at the aortic arch without aneurysm or stenosis. Pulmonary arteries are within normal limits. Mediastinum/Nodes: No significant mediastinum, hilar, or axillary adenopathy is present. Moderate-sized hiatal hernia is again seen. The esophagus is normal. Thoracic inlet is normal. Lungs/Pleura: Medial posterior right lower lobe airspace opacities are present. Minimal atelectasis present at the right base. No other focal nodule, mass lesion, or airspace disease is present. No significant pleural effusions are present. Musculoskeletal: Fused anterior osteophytes are present. Vertebral body heights and alignment are maintained. No focal lytic or blastic lesions are present. Ribs are within normal limits bilaterally. CT ABDOMEN PELVIS FINDINGS Hepatobiliary: No focal liver abnormality is seen. Status post cholecystectomy. No biliary dilatation. Pancreas: Unremarkable. No pancreatic ductal dilatation or surrounding inflammatory changes. Spleen: Normal in size without focal abnormality. Adrenals/Urinary Tract: 10 mm nonobstructing stone is present at the lower pole of the right kidney. Two 3 mm nonobstructing stones are present at the lower pole of the left kidney. No other focal lesions are present. The ureters are within normal limits bilaterally. The urinary bladder is normal. Stomach/Bowel: Small hiatal hernia is present. The stomach and duodenum are otherwise normal. Small bowel is unremarkable. Terminal ileum is within normal limits. The appendix is visualized and normal. The ascending and transverse colon are within normal limits. Diverticular changes are present within the descending and sigmoid colon. No associated inflammatory changes are present to suggest diverticulitis. Vascular/Lymphatic: Atherosclerotic calcifications are present in the aorta and branch vessels without aneurysm. No  significant adenopathy is present. Reproductive: Status post hysterectomy. No adnexal masses. Other: Prominent lower ventral abdominal hernia measures over 11 cm across. Large and small bowel extend into the hernia without obstruction. Musculoskeletal: Solid lumbar fusion is present at L3-4 and L4-5. Wide laminectomy is present at L4. Vertebral body heights are maintained. Hardware is intact. Degenerative changes are present at  the SI joints, right greater than left. IMPRESSION: 1. Medial right lower lobe airspace disease concerning for pneumonia. 2. Bilateral nonobstructing nephrolithiasis. 3. Descending and sigmoid diverticulosis without diverticulitis. 4. Large lower ventral abdominal hernia contains large and small bowel without obstruction. 5. Fusion of the lumbar spine at L3-4 and L4-5 with wide laminectomy at L4. 6. Aortic Atherosclerosis (ICD10-I70.0). Electronically Signed   By: San Morelle M.D.   On: 02/26/2020 06:37   CT ABDOMEN PELVIS W CONTRAST  Result Date: 02/26/2020 CLINICAL DATA:  Pneumonia, fusion, or abscess suspected. EXAM: CT CHEST, ABDOMEN, AND PELVIS WITH CONTRAST TECHNIQUE: Multidetector CT imaging of the chest, abdomen and pelvis was performed following the standard protocol during bolus administration of intravenous contrast. CONTRAST:  168mL OMNIPAQUE IOHEXOL 300 MG/ML  SOLN COMPARISON:  One-view chest x-ray 02/26/2020. CT of the abdomen and pelvis 12/28/2012. FINDINGS: CT CHEST FINDINGS Cardiovascular: Heart size upper limits of normal. Coronary artery calcifications are present. Calcifications are present at the aortic arch without aneurysm or stenosis. Pulmonary arteries are within normal limits. Mediastinum/Nodes: No significant mediastinum, hilar, or axillary adenopathy is present. Moderate-sized hiatal hernia is again seen. The esophagus is normal. Thoracic inlet is normal. Lungs/Pleura: Medial posterior right lower lobe airspace opacities are present. Minimal atelectasis  present at the right base. No other focal nodule, mass lesion, or airspace disease is present. No significant pleural effusions are present. Musculoskeletal: Fused anterior osteophytes are present. Vertebral body heights and alignment are maintained. No focal lytic or blastic lesions are present. Ribs are within normal limits bilaterally. CT ABDOMEN PELVIS FINDINGS Hepatobiliary: No focal liver abnormality is seen. Status post cholecystectomy. No biliary dilatation. Pancreas: Unremarkable. No pancreatic ductal dilatation or surrounding inflammatory changes. Spleen: Normal in size without focal abnormality. Adrenals/Urinary Tract: 10 mm nonobstructing stone is present at the lower pole of the right kidney. Two 3 mm nonobstructing stones are present at the lower pole of the left kidney. No other focal lesions are present. The ureters are within normal limits bilaterally. The urinary bladder is normal. Stomach/Bowel: Small hiatal hernia is present. The stomach and duodenum are otherwise normal. Small bowel is unremarkable. Terminal ileum is within normal limits. The appendix is visualized and normal. The ascending and transverse colon are within normal limits. Diverticular changes are present within the descending and sigmoid colon. No associated inflammatory changes are present to suggest diverticulitis. Vascular/Lymphatic: Atherosclerotic calcifications are present in the aorta and branch vessels without aneurysm. No significant adenopathy is present. Reproductive: Status post hysterectomy. No adnexal masses. Other: Prominent lower ventral abdominal hernia measures over 11 cm across. Large and small bowel extend into the hernia without obstruction. Musculoskeletal: Solid lumbar fusion is present at L3-4 and L4-5. Wide laminectomy is present at L4. Vertebral body heights are maintained. Hardware is intact. Degenerative changes are present at the SI joints, right greater than left. IMPRESSION: 1. Medial right lower  lobe airspace disease concerning for pneumonia. 2. Bilateral nonobstructing nephrolithiasis. 3. Descending and sigmoid diverticulosis without diverticulitis. 4. Large lower ventral abdominal hernia contains large and small bowel without obstruction. 5. Fusion of the lumbar spine at L3-4 and L4-5 with wide laminectomy at L4. 6. Aortic Atherosclerosis (ICD10-I70.0). Electronically Signed   By: San Morelle M.D.   On: 02/26/2020 06:37   DG Chest Port 1 View  Result Date: 02/26/2020 CLINICAL DATA:  Shortness of breath EXAM: PORTABLE CHEST 1 VIEW COMPARISON:  12/21/2015 FINDINGS: Cardiac shadow is enlarged but stable. Tortuous thoracic aorta is noted. The lungs are well aerated bilaterally. Elevation  of the right hemidiaphragm is noted. IMPRESSION: No active disease. Electronically Signed   By: Inez Catalina M.D.   On: 02/26/2020 03:41    Scheduled Meds:  acetaminophen  1,000 mg Oral Once   atorvastatin  80 mg Oral QHS   docusate sodium  100 mg Oral BID   enoxaparin (LOVENOX) injection  40 mg Subcutaneous Q24H   insulin aspart  0-15 Units Subcutaneous TID WC   insulin aspart  0-5 Units Subcutaneous QHS   insulin aspart protamine- aspart  45 Units Subcutaneous BID WC   metoprolol tartrate  12.5 mg Oral BID   pantoprazole  40 mg Oral Daily   sodium chloride flush  3 mL Intravenous Q12H   tamsulosin  0.4 mg Oral Daily   Continuous Infusions:  cefTRIAXone (ROCEPHIN)  IV 1 g (02/27/20 1208)   lactated ringers 100 mL/hr at 02/27/20 1007     LOS: 0 days   Time spent: 40 minutes   Darliss Cheney, MD [facility]  02/27/2020, 3:22 PM   To contact the attending provider between 7A-7P or the covering provider during after hours 7P-7A, please log into the web site [email or web address].

## 2020-02-28 DIAGNOSIS — I4891 Unspecified atrial fibrillation: Secondary | ICD-10-CM

## 2020-02-28 LAB — CBC
HCT: 37.3 % — ABNORMAL LOW (ref 39.0–52.0)
Hemoglobin: 11.9 g/dL — ABNORMAL LOW (ref 13.0–17.0)
MCH: 27.4 pg (ref 26.0–34.0)
MCHC: 31.9 g/dL (ref 30.0–36.0)
MCV: 85.9 fL (ref 80.0–100.0)
Platelets: 200 10*3/uL (ref 150–400)
RBC: 4.34 MIL/uL (ref 4.22–5.81)
RDW: 14.4 % (ref 11.5–15.5)
WBC: 4.9 10*3/uL (ref 4.0–10.5)
nRBC: 0 % (ref 0.0–0.2)

## 2020-02-28 LAB — BASIC METABOLIC PANEL
Anion gap: 10 (ref 5–15)
BUN: 10 mg/dL (ref 8–23)
CO2: 24 mmol/L (ref 22–32)
Calcium: 8.9 mg/dL (ref 8.9–10.3)
Chloride: 104 mmol/L (ref 98–111)
Creatinine, Ser: 0.93 mg/dL (ref 0.61–1.24)
GFR calc Af Amer: >60 mL/min (ref >60)
GFR calc non Af Amer: >60 mL/min (ref >60)
Glucose, Bld: 136 mg/dL — ABNORMAL HIGH (ref 70–99)
Potassium: 3.3 mmol/L — ABNORMAL LOW (ref 3.5–5.1)
Sodium: 138 mmol/L (ref 135–145)

## 2020-02-28 LAB — GLUCOSE, CAPILLARY: Glucose-Capillary: 154 mg/dL — ABNORMAL HIGH (ref 70–99)

## 2020-02-28 MED ORDER — METOPROLOL TARTRATE 25 MG PO TABS: 25.0000 mg | ORAL_TABLET | Freq: Two times a day (BID) | ORAL | Status: DC

## 2020-02-28 MED ORDER — METOPROLOL TARTRATE 25 MG PO TABS: 25.0000 mg | ORAL_TABLET | Freq: Two times a day (BID) | ORAL | 0 refills | Status: DC

## 2020-02-28 MED ORDER — CEFDINIR 300 MG PO CAPS: 300.0000 mg | ORAL_CAPSULE | Freq: Two times a day (BID) | ORAL | 0 refills | Status: AC

## 2020-02-28 MED ORDER — POTASSIUM CHLORIDE CRYS ER 20 MEQ PO TBCR
40.0000 meq | EXTENDED_RELEASE_TABLET | ORAL | Status: DC
  Administered 2020-02-28: 40 meq via ORAL
  Filled 2020-02-28: qty 2

## 2020-02-28 MED ORDER — APIXABAN 5 MG PO TABS: 5.0000 mg | ORAL_TABLET | Freq: Two times a day (BID) | ORAL | 0 refills | Status: DC

## 2020-02-28 NOTE — Discharge Summary (Signed)
Physician Discharge Summary  GEORG FONS [id number] DOB: 1946/03/21 DOA: 02/26/2020  PCP: Lavone Orn, MD  Admit date: 02/26/2020 Discharge date: 02/28/2020  Admitted From: Home Disposition: Home  Recommendations for Outpatient Follow-up:  1. Follow up with PCP in 1-2 weeks 2. Follow-up with cardiology in 4 to 6 weeks 3. Please obtain BMP/CBC in one week 4. Please follow up on the following pending results:  Home Health: None Equipment/Devices: None  Discharge Condition: Stable CODE STATUS: Full code Diet recommendation: Cardiac  Subjective: Patient seen and examined.  He has no complaints.  Denied any chest pain or shortness of breath.  Remains alert and oriented with great sense of humor.  [id number] C Duffinsis a 74 y.o.malewith medical history significant ofOSA on CPAP; HTN; and DM presenting with AMS.He reports that his wife sent him in because she thought he was confused - but he is not sure that he is. He reports long-term word finding difficulties and difficulty with remembering things, but is unable to clearly define what has been going on.   I spoke with his wife. She reports that yesterday, he ate around 11 and had coffee and a banana. About 12pm, he ate a sausage biscuit. He got an upset stomach shortly thereafter; they thought maybe the sandwich was not good. He was nauseated with dry heaves and had a fever. He was very weak. She gave him Tums, ginger ale, saltines. Maybe an hour later, he seemed to feel better. About 1AM, she found that he had urinated on the floor and was leaning on the sink. He wasn't really speaking. She brought him a chair and found that he had a fever to 101. She decided to send him to the ER and got him dressed. Then he refused to go to the ER. He didn't seem to be processing the information. She called 911. He does "talk out of his mind a little bit" when he gets infections. He also had hives on his upper torso. She reports  that he takes a lot of medications due to a failed back surgery and so he does not remember things as well as prior. "His cognition is okay, he can drive.... I am not 100% concerned about his cognition per se except when he is sick." Much of this may be associated with his pain meds as well as the added infection. He has been having increased urinary frequency recently. He has a "special cup that he used" when he has incontinence.   ED Course:AMS. ?UTI with PNA - no cough but disoriented (no underlying dementia). Ate breakfast and coughed, ?aspirated and then really confused - peed in the floor. Febrile to 101+, tachypnea. UA mildly abnormal. CT with RLL infiltrate. BD COVID negative. Sepsis eval started, given Vanc, Cefepime.  Brief/Interim Summary: Patient was initially admitted secondary to acute encephalopathy which was thought to be secondary to sepsis which was in turn likely secondary to combination of UTI and pneumonia.  He was started on broad-spectrum antibiotics.  Received some fluids.  His sepsis parameters resolved and his acute encephalopathy resolved as well however he then had a brief episode of atrial fibrillation which was terminated by 1 dose of IV Lopressor 5 mg and he converted to sinus rhythm.  Cardiology was consulted.  He was started on oral Lopressor 12.5 mg p.o. twice daily.  This was then increased to 25 mg twice daily and he was started on Eliquis by cardiology and he was cleared to go home.  Patient did not  have further episode of atrial fibrillation since last 24 hours and he has remained alert and oriented.  He is not on oxygen.  Urine culture shows multiple species, suggesting this was contaminated however blood culture also remains negative.  Now that he is feeling better so he is going to be discharged.  I will to discharge him on cefdinir 300 mg p.o. twice daily for treatment of pneumonia which will hopefully treat his UTI as well.  He is agreeable with  plan.  Discharge Diagnoses:  Principal Problem:   Acute metabolic encephalopathy Active Problems:   Uncontrolled type 2 diabetes mellitus with hyperglycemia, with long-term current use of insulin (HCC)   Dyslipidemia   Class 1 obesity with body mass index (BMI) of 33.0 to 33.9 in adult   Essential hypertension   Sleep apnea   Sepsis due to undetermined organism [facility])   Chronic pain   New onset atrial fibrillation [facility])    Discharge Instructions  Discharge Instructions    Discharge patient   Complete by: As directed    Discharge disposition: 01-Home or Self Care   Discharge patient date: 02/28/2020     Allergies as of 02/28/2020   No Known Allergies     Medication List    TAKE these medications   amLODipine 5 MG tablet Commonly known as: NORVASC Take 5 mg by mouth daily.   apixaban 5 MG Tabs tablet Commonly known as: ELIQUIS Take 1 tablet (5 mg total) by mouth 2 (two) times daily.   atorvastatin 80 MG tablet Commonly known as: LIPITOR Take 80 mg by mouth at bedtime.   cefdinir 300 MG capsule Commonly known as: OMNICEF Take 1 capsule (300 mg total) by mouth 2 (two) times daily for 5 days.   losartan 100 MG tablet Commonly known as: COZAAR Take 100 mg by mouth daily.   metFORMIN 1000 MG tablet Commonly known as: GLUCOPHAGE Take 1,000 mg by mouth 2 (two) times daily with a meal.   metoprolol tartrate 25 MG tablet Commonly known as: LOPRESSOR Take 1 tablet (25 mg total) by mouth 2 (two) times daily.   NovoLIN 70/30 (70-30) 100 UNIT/ML injection Generic drug: insulin NPH-regular Human Inject 45 Units into the skin 2 (two) times daily with a meal.   omeprazole 40 MG capsule Commonly known as: PRILOSEC Take 40 mg by mouth daily.   Oxycodone HCl 10 MG Tabs Take 10 mg by mouth every 4 (four) hours as needed (for pain).   tadalafil 5 MG tablet Commonly known as: CIALIS Take 5 mg by mouth daily as needed for erectile dysfunction.   tamsulosin 0.4 MG Caps  capsule Commonly known as: FLOMAX Take 0.4 mg by mouth daily.   Victoza 18 MG/3ML Sopn Generic drug: liraglutide Inject 1.2 mg into the skin daily.      Follow-up Information    Lelon Perla, MD Follow up in 4 week(s).   Specialty: Cardiology Why: Our office will contact you for a follow-up appt with Dr. Stanford Breed in approximately 3-4 wks. Contact information: [address] [place] [address] [phone]        Lavone Orn, MD Follow up in 1 week(s).   Specialty: Internal Medicine Contact information: [address] [place] [address] [phone]          No Known Allergies  Consultations: Cardiology   Procedures/Studies: CT Head Wo Contrast  Result Date: 02/26/2020 CLINICAL DATA:  Altered mental status EXAM: CT HEAD WITHOUT CONTRAST TECHNIQUE: Contiguous  axial images were obtained from the base of the skull through the vertex without intravenous contrast. COMPARISON:  Apr 28, 2016 FINDINGS: Brain: No evidence of acute territorial infarction, hemorrhage, hydrocephalus,extra-axial collection or mass lesion/mass effect. There is dilatation the ventricles and sulci consistent with age-related atrophy. Low-attenuation changes in the deep white matter consistent with small vessel ischemia. Vascular: No hyperdense vessel or unexpected calcification. Skull: The skull is intact. No fracture or focal lesion identified. Sinuses/Orbits: The visualized paranasal sinuses and mastoid air cells are clear. The orbits and globes intact. Other: None IMPRESSION: No acute intracranial abnormality. Findings consistent with age related atrophy and chronic small vessel ischemia Electronically Signed   By: Prudencio Pair M.D.   On: 02/26/2020 06:01   CT Chest W Contrast  Result Date: 02/26/2020 CLINICAL DATA:  Pneumonia, fusion, or abscess suspected. EXAM: CT CHEST, ABDOMEN, AND PELVIS WITH CONTRAST TECHNIQUE: Multidetector CT imaging of the chest, abdomen and  pelvis was performed following the standard protocol during bolus administration of intravenous contrast. CONTRAST:  134mL OMNIPAQUE IOHEXOL 300 MG/ML  SOLN COMPARISON:  One-view chest x-ray 02/26/2020. CT of the abdomen and pelvis 12/28/2012. FINDINGS: CT CHEST FINDINGS Cardiovascular: Heart size upper limits of normal. Coronary artery calcifications are present. Calcifications are present at the aortic arch without aneurysm or stenosis. Pulmonary arteries are within normal limits. Mediastinum/Nodes: No significant mediastinum, hilar, or axillary adenopathy is present. Moderate-sized hiatal hernia is again seen. The esophagus is normal. Thoracic inlet is normal. Lungs/Pleura: Medial posterior right lower lobe airspace opacities are present. Minimal atelectasis present at the right base. No other focal nodule, mass lesion, or airspace disease is present. No significant pleural effusions are present. Musculoskeletal: Fused anterior osteophytes are present. Vertebral body heights and alignment are maintained. No focal lytic or blastic lesions are present. Ribs are within normal limits bilaterally. CT ABDOMEN PELVIS FINDINGS Hepatobiliary: No focal liver abnormality is seen. Status post cholecystectomy. No biliary dilatation. Pancreas: Unremarkable. No pancreatic ductal dilatation or surrounding inflammatory changes. Spleen: Normal in size without focal abnormality. Adrenals/Urinary Tract: 10 mm nonobstructing stone is present at the lower pole of the right kidney. Two 3 mm nonobstructing stones are present at the lower pole of the left kidney. No other focal lesions are present. The ureters are within normal limits bilaterally. The urinary bladder is normal. Stomach/Bowel: Small hiatal hernia is present. The stomach and duodenum are otherwise normal. Small bowel is unremarkable. Terminal ileum is within normal limits. The appendix is visualized and normal. The ascending and transverse colon are within normal limits.  Diverticular changes are present within the descending and sigmoid colon. No associated inflammatory changes are present to suggest diverticulitis. Vascular/Lymphatic: Atherosclerotic calcifications are present in the aorta and branch vessels without aneurysm. No significant adenopathy is present. Reproductive: Status post hysterectomy. No adnexal masses. Other: Prominent lower ventral abdominal hernia measures over 11 cm across. Large and small bowel extend into the hernia without obstruction. Musculoskeletal: Solid lumbar fusion is present at L3-4 and L4-5. Wide laminectomy is present at L4. Vertebral body heights are maintained. Hardware is intact. Degenerative changes are present at the SI joints, right greater than left. IMPRESSION: 1. Medial right lower lobe airspace disease concerning for pneumonia. 2. Bilateral nonobstructing nephrolithiasis. 3. Descending and sigmoid diverticulosis without diverticulitis. 4. Large lower ventral abdominal hernia contains large and small bowel without obstruction. 5. Fusion of the lumbar spine at L3-4 and L4-5 with wide laminectomy at L4. 6. Aortic Atherosclerosis (ICD10-I70.0). Electronically Signed   By: Wynetta Fines.D.  On: 02/26/2020 06:37   CT ABDOMEN PELVIS W CONTRAST  Result Date: 02/26/2020 CLINICAL DATA:  Pneumonia, fusion, or abscess suspected. EXAM: CT CHEST, ABDOMEN, AND PELVIS WITH CONTRAST TECHNIQUE: Multidetector CT imaging of the chest, abdomen and pelvis was performed following the standard protocol during bolus administration of intravenous contrast. CONTRAST:  161mL OMNIPAQUE IOHEXOL 300 MG/ML  SOLN COMPARISON:  One-view chest x-ray 02/26/2020. CT of the abdomen and pelvis 12/28/2012. FINDINGS: CT CHEST FINDINGS Cardiovascular: Heart size upper limits of normal. Coronary artery calcifications are present. Calcifications are present at the aortic arch without aneurysm or stenosis. Pulmonary arteries are within normal limits. Mediastinum/Nodes:  No significant mediastinum, hilar, or axillary adenopathy is present. Moderate-sized hiatal hernia is again seen. The esophagus is normal. Thoracic inlet is normal. Lungs/Pleura: Medial posterior right lower lobe airspace opacities are present. Minimal atelectasis present at the right base. No other focal nodule, mass lesion, or airspace disease is present. No significant pleural effusions are present. Musculoskeletal: Fused anterior osteophytes are present. Vertebral body heights and alignment are maintained. No focal lytic or blastic lesions are present. Ribs are within normal limits bilaterally. CT ABDOMEN PELVIS FINDINGS Hepatobiliary: No focal liver abnormality is seen. Status post cholecystectomy. No biliary dilatation. Pancreas: Unremarkable. No pancreatic ductal dilatation or surrounding inflammatory changes. Spleen: Normal in size without focal abnormality. Adrenals/Urinary Tract: 10 mm nonobstructing stone is present at the lower pole of the right kidney. Two 3 mm nonobstructing stones are present at the lower pole of the left kidney. No other focal lesions are present. The ureters are within normal limits bilaterally. The urinary bladder is normal. Stomach/Bowel: Small hiatal hernia is present. The stomach and duodenum are otherwise normal. Small bowel is unremarkable. Terminal ileum is within normal limits. The appendix is visualized and normal. The ascending and transverse colon are within normal limits. Diverticular changes are present within the descending and sigmoid colon. No associated inflammatory changes are present to suggest diverticulitis. Vascular/Lymphatic: Atherosclerotic calcifications are present in the aorta and branch vessels without aneurysm. No significant adenopathy is present. Reproductive: Status post hysterectomy. No adnexal masses. Other: Prominent lower ventral abdominal hernia measures over 11 cm across. Large and small bowel extend into the hernia without obstruction.  Musculoskeletal: Solid lumbar fusion is present at L3-4 and L4-5. Wide laminectomy is present at L4. Vertebral body heights are maintained. Hardware is intact. Degenerative changes are present at the SI joints, right greater than left. IMPRESSION: 1. Medial right lower lobe airspace disease concerning for pneumonia. 2. Bilateral nonobstructing nephrolithiasis. 3. Descending and sigmoid diverticulosis without diverticulitis. 4. Large lower ventral abdominal hernia contains large and small bowel without obstruction. 5. Fusion of the lumbar spine at L3-4 and L4-5 with wide laminectomy at L4. 6. Aortic Atherosclerosis (ICD10-I70.0). Electronically Signed   By: San Morelle M.D.   On: 02/26/2020 06:37   DG Chest Port 1 View  Result Date: 02/26/2020 CLINICAL DATA:  Shortness of breath EXAM: PORTABLE CHEST 1 VIEW COMPARISON:  12/21/2015 FINDINGS: Cardiac shadow is enlarged but stable. Tortuous thoracic aorta is noted. The lungs are well aerated bilaterally. Elevation of the right hemidiaphragm is noted. IMPRESSION: No active disease. Electronically Signed   By: Inez Catalina M.D.   On: 02/26/2020 03:41   ECHOCARDIOGRAM COMPLETE  Result Date: 02/27/2020    ECHOCARDIOGRAM REPORT   Patient Name:   DOANE PLUDE [place] Date of Exam: 02/27/2020 Medical Rec #:  [id number]      Height:       6.0 in Accession #:  [id number]     Weight:       242.3 lb Date of Birth:  [birth year]     BSA:          0.381 m Patient Age:    74 years       BP:           133/74 mmHg Patient Gender: M              HR:           76 bpm. Exam Location:  Inpatient Procedure: 2D Echo, Cardiac Doppler and Color Doppler Indications:    Atrial fibrillation 427.31/I48.91  History:        Patient has no prior history of Echocardiogram examinations.                 Risk Factors:Sleep Apnea, Hypertension, Dyslipidemia and Former                 Smoker.  Sonographer:    Clayton Lefort RDCS (AE) Referring Phys: [id number] Darreld Mclean  Sonographer Comments:  Suboptimal parasternal window, patient is morbidly obese, suboptimal subcostal window and Technically difficult study due to poor echo windows. Image acquisition challenging due to patient body habitus and Image acquisition challenging due to respiratory motion. IMPRESSIONS  1. Normal LV systolic function; grade 1 diastolic dysfunction; mild LVH; mildly dilated aortic root; mild LAE; trace AI and MR.  2. Left ventricular ejection fraction, by estimation, is 55 to 60%. The left ventricle has normal function. The left ventricle has no regional wall motion abnormalities. There is mild left ventricular hypertrophy. Left ventricular diastolic parameters are consistent with Grade I diastolic dysfunction (impaired relaxation).  3. Right ventricular systolic function is normal. The right ventricular size is normal.  4. Left atrial size was mildly dilated.  5. The mitral valve is normal in structure and function. Trivial mitral valve regurgitation. No evidence of mitral stenosis.  6. The aortic valve has an indeterminant number of cusps. Aortic valve regurgitation is trivial. No aortic stenosis is present.  7. Aortic dilatation noted. There is mild dilatation of the aortic root measuring 40 mm. FINDINGS  Left Ventricle: Left ventricular ejection fraction, by estimation, is 55 to 60%. The left ventricle has normal function. The left ventricle has no regional wall motion abnormalities. The left ventricular internal cavity size was normal in size. There is  mild left ventricular hypertrophy. Left ventricular diastolic parameters are consistent with Grade I diastolic dysfunction (impaired relaxation). Right Ventricle: The right ventricular size is normal. Right ventricular systolic function is normal. Left Atrium: Left atrial size was mildly dilated. Right Atrium: Right atrial size was normal in size. Pericardium: There is no evidence of pericardial effusion. Mitral Valve: The mitral valve is normal in structure and function.  Normal mobility of the mitral valve leaflets. Trivial mitral valve regurgitation. No evidence of mitral valve stenosis. Tricuspid Valve: The tricuspid valve is normal in structure. Tricuspid valve regurgitation is trivial. No evidence of tricuspid stenosis. Aortic Valve: The aortic valve has an indeterminant number of cusps. Aortic valve regurgitation is trivial. Aortic regurgitation PHT measures 960 msec. No aortic stenosis is present. Aortic valve mean gradient measures 4.0 mmHg. Aortic valve peak gradient measures 7.0 mmHg. Aortic valve area, by VTI measures 2.77 cm. Pulmonic Valve: The pulmonic valve was not well visualized. Pulmonic valve regurgitation is not visualized. No evidence of pulmonic stenosis. Aorta: Aortic dilatation noted. There is mild dilatation of the aortic root measuring 40 mm. Venous: The  inferior vena cava was not well visualized.  Additional Comments: Normal LV systolic function; grade 1 diastolic dysfunction; mild LVH; mildly dilated aortic root; mild LAE; trace AI and MR.  LEFT VENTRICLE PLAX 2D LVIDd:         5.34 cm LVIDs:         3.79 cm LV PW:         1.39 cm LV IVS:        1.41 cm LVOT diam:     2.10 cm LV SV:         70 LV SV Index:   183 LVOT Area:     3.46 cm  RIGHT VENTRICLE RV Basal diam:  3.20 cm RV S prime:     16.00 cm/s TAPSE (M-mode): 2.0 cm LEFT ATRIUM             Index        RIGHT ATRIUM           Index LA diam:        3.10 cm 8.14 cm/m   RA Area:     17.60 cm LA Vol (A2C):   61.5 ml 161.53 ml/m RA Volume:   45.30 ml  118.98 ml/m LA Vol (A4C):   44.6 ml 117.14 ml/m LA Biplane Vol: 52.2 ml 137.10 ml/m  AORTIC VALVE AV Area (Vmax):    2.60 cm AV Area (Vmean):   2.48 cm AV Area (VTI):     2.77 cm AV Vmax:           132.67 cm/s AV Vmean:          92.900 cm/s AV VTI:            0.252 m AV Peak Grad:      7.0 mmHg AV Mean Grad:      4.0 mmHg LVOT Vmax:         99.40 cm/s LVOT Vmean:        66.580 cm/s LVOT VTI:          0.201 m LVOT/AV VTI ratio: 0.80 AI PHT:             960 msec  AORTA Ao Root diam: 3.40 cm Ao Asc diam:  4.00 cm  SHUNTS Systemic VTI:  0.20 m Systemic Diam: 2.10 cm Kirk Ruths MD Electronically signed by Kirk Ruths MD Signature Date/Time: 02/27/2020/5:20:33 PM    Final       Discharge Exam: Vitals:   02/27/20 2053 02/28/20 0650  BP: (!) 154/79 (!) 150/90  Pulse: 75 66  Resp: 20 20  Temp: 99.4 F (37.4 C) 98.4 F (36.9 C)  SpO2: 98% 97%   Vitals:   02/27/20 1130 02/27/20 1200 02/27/20 2053 02/28/20 0650  BP: 126/75 133/74 (!) 154/79 (!) 150/90  Pulse: 76 76 75 66  Resp:   20 20  Temp:   99.4 F (37.4 C) 98.4 F (36.9 C)  TempSrc:   Oral Oral  SpO2: 96% 95% 98% 97%  Weight:    110.6 kg  Height:        General: Pt is alert, awake, not in acute distress Cardiovascular: RRR, S1/S2 +, no rubs, no gallops Respiratory: CTA bilaterally, no wheezing, no rhonchi Abdominal: Soft, NT, ND, bowel sounds + Extremities: Right lower extremity bigger than the left phone with +1 pitting edema on the right lower extremity (this is chronic and at baseline according to patient) no cyanosis    The results of significant diagnostics from this hospitalization (  including imaging, microbiology, ancillary and laboratory) are listed below for reference.     Microbiology: Recent Results (from the past 240 hour(s))  Urine culture     Status: Abnormal   Collection Time: 02/26/20  3:51 AM   Specimen: In/Out Cath Urine  Result Value Ref Range Status   Specimen Description IN/OUT CATH URINE  Final   Special Requests   Final    NONE Performed at [facility], [address] [place], [place] [address]    Culture MULTIPLE SPECIES PRESENT, SUGGEST RECOLLECTION (A)  Final   Report Status 02/26/2020 FINAL  Final  Blood Culture (routine x 2)     Status: None (Preliminary result)   Collection Time: 02/26/20  4:08 AM   Specimen: BLOOD  Result Value Ref Range Status   Specimen Description BLOOD RIGHT ANTECUBITAL  Final   Special Requests    Final    BOTTLES DRAWN AEROBIC AND ANAEROBIC Blood Culture results may not be optimal due to an inadequate volume of blood received in culture bottles   Culture   Final    NO GROWTH 1 DAY Performed at [facility], [place] [address], [place], [place] [address]    Report Status PENDING  Incomplete  Blood Culture (routine x 2)     Status: None (Preliminary result)   Collection Time: 02/26/20  4:26 AM   Specimen: BLOOD  Result Value Ref Range Status   Specimen Description BLOOD RIGHT ANTECUBITAL  Final   Special Requests   Final    BOTTLES DRAWN AEROBIC AND ANAEROBIC Blood Culture adequate volume   Culture   Final    NO GROWTH 1 DAY Performed at [facility], [place] [address] [place], [place] [address]    Report Status PENDING  Incomplete  SARS CORONAVIRUS 2 (TAT 6-24 HRS) Nasopharyngeal Nasopharyngeal Swab     Status: None   Collection Time: 02/26/20  5:48 AM   Specimen: Nasopharyngeal Swab  Result Value Ref Range Status   SARS Coronavirus 2 NEGATIVE NEGATIVE Final    Comment: (NOTE) SARS-CoV-2 target nucleic acids are NOT DETECTED. The SARS-CoV-2 RNA is generally detectable in upper and lower respiratory specimens during the acute phase of infection. Negative results do not preclude SARS-CoV-2 infection, do not rule out co-infections with other pathogens, and should not be used as the sole basis for treatment or other patient management decisions. Negative results must be combined with clinical observations, patient history, and epidemiological information. The expected result is Negative. Fact Sheet for Patients: SugarRoll.be Fact Sheet for Healthcare Providers: https://www.woods-mathews.com/ This test is not yet approved or cleared by the [place] FDA and  has been authorized for detection and/or diagnosis of SARS-CoV-2 by FDA under an Emergency Use Authorization (EUA). This EUA will remain  in effect (meaning this test  can be used) for the duration of the COVID-19 declaration under Section 56 4(b)(1) of the Act, 21 U.S.C. section 360bbb-3(b)(1), unless the authorization is terminated or revoked sooner. Performed at [facility], [place] [address], [place], [place] [address]      Labs: BNP (last 3 results) No results for input(s): BNP in the last 8760 hours. Basic Metabolic Panel: Recent Labs  Lab 02/26/20 0426 02/27/20 0408 02/27/20 1758 02/28/20 0357  NA 137 137  --  138  K 4.5 3.7  --  3.3*  CL 102 103  --  104  CO2 23 25  --  24  GLUCOSE 194* 173*  --  136*  BUN 12 11  --  10  CREATININE 1.16 1.13  --  0.93  CALCIUM 9.4 9.1  --  8.9  MG  --   --  1.7  --    Liver Function Tests: Recent Labs  Lab 02/26/20 0426  AST 51*  ALT 54*  ALKPHOS 112  BILITOT 1.2  PROT 7.4  ALBUMIN 3.5   No results for input(s): LIPASE, AMYLASE in the last 168 hours. No results for input(s): AMMONIA in the last 168 hours. CBC: Recent Labs  Lab 02/26/20 0426 02/27/20 0408 02/28/20 0357  WBC 9.3 5.7 4.9  NEUTROABS 8.0*  --   --   HGB 12.9* 12.0* 11.9*  HCT 41.0 37.1* 37.3*  MCV 87.8 85.1 85.9  PLT 196 188 200   Cardiac Enzymes: No results for input(s): CKTOTAL, CKMB, CKMBINDEX, TROPONINI in the last 168 hours. BNP: Invalid input(s): POCBNP CBG: Recent Labs  Lab 02/27/20 1020 02/27/20 1229 02/27/20 1742 02/27/20 2054 02/28/20 0735  GLUCAP 167* 237* 225* 158* 154*   D-Dimer No results for input(s): DDIMER in the last 72 hours. Hgb A1c Recent Labs    02/26/20 0426  HGBA1C 7.9*   Lipid Profile No results for input(s): CHOL, HDL, LDLCALC, TRIG, CHOLHDL, LDLDIRECT in the last 72 hours. Thyroid function studies Recent Labs    02/27/20 1758  TSH 2.149   Anemia work up No results for input(s): VITAMINB12, FOLATE, FERRITIN, TIBC, IRON, RETICCTPCT in the last 72 hours. Urinalysis    Component Value Date/Time   COLORURINE YELLOW 02/26/2020 0323   APPEARANCEUR CLEAR 02/26/2020  0323   LABSPEC 1.017 02/26/2020 0323   PHURINE 6.0 02/26/2020 0323   GLUCOSEU 150 (A) 02/26/2020 0323   HGBUR MODERATE (A) 02/26/2020 0323   BILIRUBINUR NEGATIVE 02/26/2020 0323   KETONESUR NEGATIVE 02/26/2020 0323   PROTEINUR 30 (A) 02/26/2020 0323   UROBILINOGEN 2.0 (H) 12/28/2012 0110   NITRITE NEGATIVE 02/26/2020 0323   LEUKOCYTESUR NEGATIVE 02/26/2020 0323   Sepsis Labs Invalid input(s): PROCALCITONIN,  WBC,  LACTICIDVEN Microbiology Recent Results (from the past 240 hour(s))  Urine culture     Status: Abnormal   Collection Time: 02/26/20  3:51 AM   Specimen: In/Out Cath Urine  Result Value Ref Range Status   Specimen Description IN/OUT CATH URINE  Final   Special Requests   Final    NONE Performed at [facility], [address] [place], [place] [address]    Culture MULTIPLE SPECIES PRESENT, SUGGEST RECOLLECTION (A)  Final   Report Status 02/26/2020 FINAL  Final  Blood Culture (routine x 2)     Status: None (Preliminary result)   Collection Time: 02/26/20  4:08 AM   Specimen: BLOOD  Result Value Ref Range Status   Specimen Description BLOOD RIGHT ANTECUBITAL  Final   Special Requests   Final    BOTTLES DRAWN AEROBIC AND ANAEROBIC Blood Culture results may not be optimal due to an inadequate volume of blood received in culture bottles   Culture   Final    NO GROWTH 1 DAY Performed at [facility], [place] [address], [place], [place] [address]    Report Status PENDING  Incomplete  Blood Culture (routine x 2)     Status: None (Preliminary result)   Collection Time: 02/26/20  4:26 AM   Specimen: BLOOD  Result Value Ref Range Status   Specimen Description BLOOD RIGHT ANTECUBITAL  Final   Special Requests   Final    BOTTLES DRAWN  AEROBIC AND ANAEROBIC Blood Culture adequate volume   Culture   Final    NO GROWTH 1 DAY Performed at [facility], [place] [address] [place], [place] [address]    Report Status PENDING  Incomplete  SARS CORONAVIRUS 2 (TAT  6-24 HRS) Nasopharyngeal Nasopharyngeal Swab     Status: None   Collection Time: 02/26/20  5:48 AM   Specimen: Nasopharyngeal Swab  Result Value Ref Range Status   SARS Coronavirus 2 NEGATIVE NEGATIVE Final    Comment: (NOTE) SARS-CoV-2 target nucleic acids are NOT DETECTED. The SARS-CoV-2 RNA is generally detectable in upper and lower respiratory specimens during the acute phase of infection. Negative results do not preclude SARS-CoV-2 infection, do not rule out co-infections with other pathogens, and should not be used as the sole basis for treatment or other patient management decisions. Negative results must be combined with clinical observations, patient history, and epidemiological information. The expected result is Negative. Fact Sheet for Patients: SugarRoll.be Fact Sheet for Healthcare Providers: https://www.woods-mathews.com/ This test is not yet approved or cleared by the [place] FDA and  has been authorized for detection and/or diagnosis of SARS-CoV-2 by FDA under an Emergency Use Authorization (EUA). This EUA will remain  in effect (meaning this test can be used) for the duration of the COVID-19 declaration under Section 56 4(b)(1) of the Act, 21 U.S.C. section 360bbb-3(b)(1), unless the authorization is terminated or revoked sooner. Performed at [facility], [place] [address], [place],  [address]      Time coordinating discharge: Over 30 minutes  SIGNED:   Darliss Cheney, MD  [facility] 02/28/2020, 9:34 AM  If 7PM-7AM, please contact night-coverage [email or web address]

## 2020-02-28 NOTE — Progress Notes (Signed)
Progress Note  Patient Name: Kevin [name] Date of Encounter: 02/28/2020  Primary Cardiologist: Dr Stanford Breed  Subjective   No CP or dyspnea  Inpatient Medications    Scheduled Meds:  acetaminophen  1,000 mg Oral Once   apixaban  5 mg Oral BID   atorvastatin  80 mg Oral QHS   docusate sodium  100 mg Oral BID   insulin aspart  0-15 Units Subcutaneous TID WC   insulin aspart  0-5 Units Subcutaneous QHS   insulin aspart protamine- aspart  50 Units Subcutaneous BID WC   metoprolol tartrate  12.5 mg Oral BID   pantoprazole  40 mg Oral Daily   sodium chloride flush  3 mL Intravenous Q12H   tamsulosin  0.4 mg Oral Daily   Continuous Infusions:  azithromycin 500 mg (02/27/20 1803)   cefTRIAXone (ROCEPHIN)  IV 1 g (02/27/20 1208)   lactated ringers 100 mL/hr at 02/27/20 2329   PRN Meds: acetaminophen **OR** acetaminophen, ondansetron **OR** ondansetron (ZOFRAN) IV, oxyCODONE, polyethylene glycol   Vital Signs    Vitals:   02/27/20 1130 02/27/20 1200 02/27/20 2053 02/28/20 0650  BP: 126/75 133/74 (!) 154/79 (!) 150/90  Pulse: 76 76 75 66  Resp:   20 20  Temp:   99.4 F (37.4 C) 98.4 F (36.9 C)  TempSrc:   Oral Oral  SpO2: 96% 95% 98% 97%  Weight:    110.6 kg  Height:        Intake/Output Summary (Last 24 hours) at 02/28/2020 0832 Last data filed at 02/28/2020 0656 Gross per 24 hour  Intake 458 ml  Output 2950 ml  Net -2492 ml   Last 3 Weights 02/28/2020 02/27/2020 02/26/2020  Weight (lbs) 243 lb 14.4 oz 242 lb 4.6 oz 255 lb  Weight (kg) 110.632 kg 109.9 kg 115.667 kg      Telemetry    Sinus with PACs and PVCs- Personally Reviewed  Physical Exam   GEN: No acute distress.   Neck: No JVD Cardiac: RRR, no murmurs, rubs, or gallops.  Respiratory: Clear to auscultation bilaterally. GI: Soft, nontender, non-distended  MS: No edema Neuro:  Nonfocal  Psych: Normal affect   Labs    Chemistry Recent Labs  Lab 02/26/20 0426 02/27/20 0408  02/28/20 0357  NA 137 137 138  K 4.5 3.7 3.3*  CL 102 103 104  CO2 23 25 24   GLUCOSE 194* 173* 136*  BUN 12 11 10   CREATININE 1.16 1.13 0.93  CALCIUM 9.4 9.1 8.9  PROT 7.4  --   --   ALBUMIN 3.5  --   --   AST 51*  --   --   ALT 54*  --   --   ALKPHOS 112  --   --   BILITOT 1.2  --   --   GFRNONAA >60 >60 >60  GFRAA >60 >60 >60  ANIONGAP 12 9 10      Hematology Recent Labs  Lab 02/26/20 0426 02/27/20 0408 02/28/20 0357  WBC 9.3 5.7 4.9  RBC 4.67 4.36 4.34  HGB 12.9* 12.0* 11.9*  HCT 41.0 37.1* 37.3*  MCV 87.8 85.1 85.9  MCH 27.6 27.5 27.4  MCHC 31.5 32.3 31.9  RDW 14.6 14.6 14.4  PLT 196 188 200    Radiology    ECHOCARDIOGRAM COMPLETE  Result Date: 02/27/2020    ECHOCARDIOGRAM REPORT   Patient Name:   Kevin [name] Date of Exam: 02/27/2020 Medical Rec #:  [id number]  Height:       6.0 in Accession #:    [id number]     Weight:       242.3 lb Date of Birth:  1946-10-08     BSA:          0.381 m Patient Age:    74 years       BP:           133/74 mmHg Patient Gender: M              HR:           76 bpm. Exam Location:  Inpatient Procedure: 2D Echo, Cardiac Doppler and Color Doppler Indications:    Atrial fibrillation 427.31/I48.91  History:        Patient has no prior history of Echocardiogram examinations.                 Risk Factors:Sleep Apnea, Hypertension, Dyslipidemia and Former                 Smoker.  Sonographer:    Clayton Lefort RDCS (AE) Referring Phys: [id number] Darreld Mclean  Sonographer Comments: Suboptimal parasternal window, patient is morbidly obese, suboptimal subcostal window and Technically difficult study due to poor echo windows. Image acquisition challenging due to patient body habitus and Image acquisition challenging due to respiratory motion. IMPRESSIONS  1. Normal LV systolic function; grade 1 diastolic dysfunction; mild LVH; mildly dilated aortic root; mild LAE; trace AI and MR.  2. Left ventricular ejection fraction, by estimation, is 55 to 60%.  The left ventricle has normal function. The left ventricle has no regional wall motion abnormalities. There is mild left ventricular hypertrophy. Left ventricular diastolic parameters are consistent with Grade I diastolic dysfunction (impaired relaxation).  3. Right ventricular systolic function is normal. The right ventricular size is normal.  4. Left atrial size was mildly dilated.  5. The mitral valve is normal in structure and function. Trivial mitral valve regurgitation. No evidence of mitral stenosis.  6. The aortic valve has an indeterminant number of cusps. Aortic valve regurgitation is trivial. No aortic stenosis is present.  7. Aortic dilatation noted. There is mild dilatation of the aortic root measuring 40 mm. FINDINGS  Left Ventricle: Left ventricular ejection fraction, by estimation, is 55 to 60%. The left ventricle has normal function. The left ventricle has no regional wall motion abnormalities. The left ventricular internal cavity size was normal in size. There is  mild left ventricular hypertrophy. Left ventricular diastolic parameters are consistent with Grade I diastolic dysfunction (impaired relaxation). Right Ventricle: The right ventricular size is normal. Right ventricular systolic function is normal. Left Atrium: Left atrial size was mildly dilated. Right Atrium: Right atrial size was normal in size. Pericardium: There is no evidence of pericardial effusion. Mitral Valve: The mitral valve is normal in structure and function. Normal mobility of the mitral valve leaflets. Trivial mitral valve regurgitation. No evidence of mitral valve stenosis. Tricuspid Valve: The tricuspid valve is normal in structure. Tricuspid valve regurgitation is trivial. No evidence of tricuspid stenosis. Aortic Valve: The aortic valve has an indeterminant number of cusps. Aortic valve regurgitation is trivial. Aortic regurgitation PHT measures 960 msec. No aortic stenosis is present. Aortic valve mean gradient  measures 4.0 mmHg. Aortic valve peak gradient measures 7.0 mmHg. Aortic valve area, by VTI measures 2.77 cm. Pulmonic Valve: The pulmonic valve was not well visualized. Pulmonic valve regurgitation is not visualized. No evidence of pulmonic stenosis. Aorta: Aortic dilatation  noted. There is mild dilatation of the aortic root measuring 40 mm. Venous: The inferior vena cava was not well visualized.  Additional Comments: Normal LV systolic function; grade 1 diastolic dysfunction; mild LVH; mildly dilated aortic root; mild LAE; trace AI and MR.  LEFT VENTRICLE PLAX 2D LVIDd:         5.34 cm LVIDs:         3.79 cm LV PW:         1.39 cm LV IVS:        1.41 cm LVOT diam:     2.10 cm LV SV:         70 LV SV Index:   183 LVOT Area:     3.46 cm  RIGHT VENTRICLE RV Basal diam:  3.20 cm RV S prime:     16.00 cm/s TAPSE (M-mode): 2.0 cm LEFT ATRIUM             Index        RIGHT ATRIUM           Index LA diam:        3.10 cm 8.14 cm/m   RA Area:     17.60 cm LA Vol (A2C):   61.5 ml 161.53 ml/m RA Volume:   45.30 ml  118.98 ml/m LA Vol (A4C):   44.6 ml 117.14 ml/m LA Biplane Vol: 52.2 ml 137.10 ml/m  AORTIC VALVE AV Area (Vmax):    2.60 cm AV Area (Vmean):   2.48 cm AV Area (VTI):     2.77 cm AV Vmax:           132.67 cm/s AV Vmean:          92.900 cm/s AV VTI:            0.252 m AV Peak Grad:      7.0 mmHg AV Mean Grad:      4.0 mmHg LVOT Vmax:         99.40 cm/s LVOT Vmean:        66.580 cm/s LVOT VTI:          0.201 m LVOT/AV VTI ratio: 0.80 AI PHT:            960 msec  AORTA Ao Root diam: 3.40 cm Ao Asc diam:  4.00 cm  SHUNTS Systemic VTI:  0.20 m Systemic Diam: 2.10 cm Kirk Ruths MD Electronically signed by Kirk Ruths MD Signature Date/Time: 02/27/2020/5:20:33 PM    Final     Patient Profile     74 year old male with past medical history of hypertension, diabetes mellitus, sleep apnea, Gastrosoft reflux disease chronic back pain admitted with encephalopathy secondary to sepsis for evaluation of atrial  fibrillation. Pt admitted with encephalopathy and fever. Chest CT showed right lower lobe airspace disease.  Patient treated with antibiotics with improvement.  On telemetry he was noted to have atrial fibrillation with rapid ventricular response that then converted back to sinus rhythm.  Cardiology asked to evaluate.  Echocardiogram shows normal LV function, grade 1 diastolic dysfunction, mild left ventricular hypertrophy, mild left atrial enlargement, trace aortic and mitral regurgitation.  Assessment & Plan    1 paroxysmal atrial fibrillation-patient remains in sinus rhythm this morning.  I will increase metoprolol to 25 mg twice daily.  As outlined previously he was asymptomatic with his atrial fibrillation including no dyspnea or palpitations.  Although his atrial fibrillation may have been related to the stress of his presenting infection he may also be having  a symptomatic bouts at home.  I would therefore favor long-term anticoagulation.  Continue apixaban 5 mg twice daily.  TSH and LV function normal.    2 hypertension-blood pressure increasing.  Continue metoprolol.  Will likely need to resume home blood pressure medications.  Will leave to primary care.  3 sepsis-question secondary to pneumonia.  Antibiotics per primary care.  [facility] will sign off.   Medication Recommendations: Present cardiac medications as listed in MAR. Other recommendations (labs, testing, etc): No additional cardiac testing. Follow up as an outpatient: Follow-up with me 4 to 6 weeks following discharge. For questions or updates, please contact [place] Please consult [email or web address] for contact info under        Signed, Kirk Ruths, MD  02/28/2020, 8:32 AM

## 2020-02-28 NOTE — Progress Notes (Signed)
Received consult -Please make sure for patient to have eliquis starter pack [facility] does not issue starter packs; case discussed with pharmacy; coupon card given to patient with instructions of usage; [facility]; [place] Supervisor [phone]

## 2020-02-28 NOTE — Discharge Instructions (Signed)
Community-Acquired Pneumonia, Adult Pneumonia is a type of lung infection that causes swelling in the airways of the lungs. Mucus and fluid may also build up inside the airways. This may cause coughing and difficulty breathing. There are different types of pneumonia. One type can develop while a person is in a hospital. A different type is called community-acquired pneumonia. It develops in people who are not, and have not recently been, in the hospital or another type of health care facility. What are the causes? This condition may be caused by:  Viruses. This is the most common cause of pneumonia.  Bacteria. Community-acquired pneumonia is often caused by Streptococcus pneumoniae bacteria. These bacteria are often passed from one person to another by breathing in droplets from the cough or sneeze of an infected person.  Fungi. This is the least common cause of pneumonia. What increases the risk? The following factors may make you more likely to develop this condition:  Having a chronic disease, such as chronic obstructive pulmonary disease (COPD), asthma, congestive heart failure, cystic fibrosis, diabetes, or kidney disease.  Having early-stage or late-stage HIV.  Having sickle cell disease.  Having had your spleen removed (splenectomy).  Having poor dental hygiene.  Having a medical condition that increases the risk of breathing in (aspirating) secretions from your own mouth and nose.  Having a weakened body defense system (immune system).  Being a smoker.  Traveling to areas where pneumonia-causing germs commonly exist.  Being around animal habitats or animals that have pneumonia-causing germs, including birds, bats, rabbits, cats, and farm animals. What are the signs or symptoms? Symptoms of this condition include:  A dry cough.  A wet (productive) cough.  Fever.  Sweating.  Chest pain, especially when breathing deeply or coughing.  Rapid breathing or difficulty  breathing.  Shortness of breath.  Shaking chills.  Fatigue.  Muscle aches. How is this diagnosed? This condition may be diagnosed based on:  Your medical history.  A physical exam. You may also have tests, including:  Chest X-rays.  Tests of your blood oxygen level and other blood gases.  Tests on blood, mucus (sputum), fluid around your lungs (pleural fluid), and urine. If your pneumonia is severe, other tests may be done to find the exact cause of your illness. How is this treated? Treatment for this condition depends on many factors, such as the cause of your pneumonia, the medicines you take, and other medical conditions that you have. For most adults, treatment and recovery from pneumonia may occur at home. In some cases, treatment must happen in a hospital. Treatment may include:  Medicines that are given by mouth or through an IV, including: ? Antibiotic medicines, if the pneumonia was caused by bacteria. ? Antiviral medicines, if the pneumonia was caused by a virus.  Being given extra oxygen.  Respiratory therapy. Although rare, treating severe pneumonia may include:  Using a machine to help you breathe (mechanical ventilation). This is done if you are not breathing well on your own and you cannot maintain a safe blood oxygen level.  Thoracentesis. This is a procedure to remove fluid from around one lung or both lungs to help you breathe better. Follow these instructions at home:  Medicines  Take over-the-counter and prescription medicines only as told by your health care provider. ? Only take cough medicine if you are losing sleep. Be aware that cough medicine can prevent your body's natural ability to remove mucus from your lungs.  If you were prescribed an antibiotic   medicine, take it as told by your health care provider. Do not stop taking the antibiotic even if you start to feel better. General instructions  Sleep in a semi-upright position at night. Try  sleeping in a reclining chair, or place a few pillows under your head.  Rest as needed and get at least 8 hours of sleep each night.  Drink enough water to keep your urine pale yellow. This will help to thin out mucus secretions in your lungs.  Eat a healthy diet that includes plenty of vegetables, fruits, whole grains, low-fat dairy products, and lean protein.  Do not use any products that contain nicotine or tobacco, such as cigarettes, e-cigarettes, and chewing tobacco. If you need help quitting, ask your health care provider.  Keep all follow-up visits as told by your health care provider. This is important. How is this prevented? You can lower your risk of developing community-acquired pneumonia by:  Getting a pneumococcal vaccine. There are different types and schedules of pneumococcal vaccines. Ask your health care provider which option is best for you. Consider getting the vaccine if: ? You are older than 75 years of age. ? You are older than 75 years of age and are undergoing cancer treatment, have chronic lung disease, or have other medical conditions that affect your immune system. Ask your health care provider if this applies to you.  Getting an influenza vaccine every year. Ask your health care provider which type of vaccine is best for you.  Getting regular checkups from your dentist.  Washing your hands often. If soap and water are not available, use hand sanitizer. Contact a health care provider if:  You have a fever.  You are losing sleep because you cannot control your cough with cough medicine. Get help right away if:  You have worsening shortness of breath.  You have increased chest pain.  Your sickness becomes worse, especially if you are an older adult or have a weakened immune system.  You cough up blood. Summary  Pneumonia is an infection of the lungs.  Community-acquired pneumonia develops in people who have not been in the hospital. It can be caused  by bacteria, viruses, or fungi.  This condition may be treated with antibiotics or antiviral medicines.  Severe cases may require hospitalization, mechanical ventilation, and other procedures to drain fluid from the lungs. This information is not intended to replace advice given to you by your health care provider. Make sure you discuss any questions you have with your health care provider. Document Revised: 08/08/2018 Document Reviewed: 08/08/2018 Elsevier Patient Education  [place]. -------------------------------------  Information on my medicine - ELIQUIS (apixaban)  Why was Eliquis prescribed for you? Eliquis was prescribed for you to reduce the risk of a blood clot forming that can cause a stroke if you have a medical condition called atrial fibrillation (a type of irregular heartbeat).  What do You need to know about Eliquis ? Take your Eliquis TWICE DAILY - one tablet in the morning and one tablet in the evening with or without food. If you have difficulty swallowing the tablet whole please discuss with your pharmacist how to take the medication safely.  Take Eliquis exactly as prescribed by your doctor and DO NOT stop taking Eliquis without talking to the doctor who prescribed the medication.  Stopping may increase your risk of developing a stroke.  Refill your prescription before you run out.  After discharge, you should have regular check-up appointments with your healthcare provider  that is prescribing your Eliquis.  In the future your dose may need to be changed if your kidney function or weight changes by a significant amount or as you get older.  What do you do if you miss a dose? If you miss a dose, take it as soon as you remember on the same day and resume taking twice daily.  Do not take more than one dose of ELIQUIS at the same time to make up a missed dose.  Important Safety Information A possible side effect of Eliquis is bleeding. You should call  your healthcare provider right away if you experience any of the following: ? Bleeding from an injury or your nose that does not stop. ? Unusual colored urine (red or dark brown) or unusual colored stools (red or black). ? Unusual bruising for unknown reasons. ? A serious fall or if you hit your head (even if there is no bleeding).  Some medicines may interact with Eliquis and might increase your risk of bleeding or clotting while on Eliquis. To help avoid this, consult your healthcare provider or pharmacist prior to using any new prescription or non-prescription medications, including herbals, vitamins, non-steroidal anti-inflammatory drugs (NSAIDs) and supplements.  This website has more information on Eliquis (apixaban): http://www.eliquis.com/eliquis/home

## 2020-02-28 NOTE — Progress Notes (Signed)
Pt's stable, dc home via wheelchair with his wife

## 2020-03-02 ENCOUNTER — Ambulatory Visit: Payer: Medicare HMO | Attending: Internal Medicine

## 2020-03-02 DIAGNOSIS — Z23 Encounter for immunization: Secondary | ICD-10-CM | POA: Insufficient documentation

## 2020-03-02 LAB — CULTURE, BLOOD (ROUTINE X 2)
Culture: NO GROWTH
Culture: NO GROWTH
Special Requests: ADEQUATE

## 2020-03-02 NOTE — Progress Notes (Signed)
   Covid-19 Vaccination Clinic  Name:  Kevin [name]    MRN: [id number] DOB: 09/24/1946  03/02/2020  [name] was observed post Covid-19 immunization for 15 minutes without incident. He was provided with Vaccine Information Sheet and instruction to access the V-Safe system.   [name] was instructed to call 911 with any severe reactions post vaccine: Marland Kitchen Difficulty breathing  . Swelling of face and throat  . A fast heartbeat  . A bad rash all over body  . Dizziness and weakness   Immunizations Administered    Name Date Dose VIS Date Route   Pfizer COVID-19 Vaccine 03/02/2020  3:44 PM 0.3 mL 12/05/2019 Intramuscular   Manufacturer: [place]   Lot: [id number]   [place]: [id number]

## 2020-03-03 DIAGNOSIS — M545 Low back pain: Secondary | ICD-10-CM | POA: Diagnosis not present

## 2020-03-08 DIAGNOSIS — J189 Pneumonia, unspecified organism: Secondary | ICD-10-CM | POA: Diagnosis not present

## 2020-03-08 DIAGNOSIS — I480 Paroxysmal atrial fibrillation: Secondary | ICD-10-CM | POA: Diagnosis not present

## 2020-03-08 DIAGNOSIS — N390 Urinary tract infection, site not specified: Secondary | ICD-10-CM | POA: Diagnosis not present

## 2020-03-15 DIAGNOSIS — E1122 Type 2 diabetes mellitus with diabetic chronic kidney disease: Secondary | ICD-10-CM | POA: Diagnosis not present

## 2020-03-15 DIAGNOSIS — N400 Enlarged prostate without lower urinary tract symptoms: Secondary | ICD-10-CM | POA: Diagnosis not present

## 2020-03-15 DIAGNOSIS — E1142 Type 2 diabetes mellitus with diabetic polyneuropathy: Secondary | ICD-10-CM | POA: Diagnosis not present

## 2020-03-15 DIAGNOSIS — Z7984 Long term (current) use of oral hypoglycemic drugs: Secondary | ICD-10-CM | POA: Diagnosis not present

## 2020-03-15 DIAGNOSIS — I2510 Atherosclerotic heart disease of native coronary artery without angina pectoris: Secondary | ICD-10-CM | POA: Diagnosis not present

## 2020-03-15 DIAGNOSIS — I480 Paroxysmal atrial fibrillation: Secondary | ICD-10-CM | POA: Diagnosis not present

## 2020-03-15 DIAGNOSIS — N401 Enlarged prostate with lower urinary tract symptoms: Secondary | ICD-10-CM | POA: Diagnosis not present

## 2020-03-15 DIAGNOSIS — E1165 Type 2 diabetes mellitus with hyperglycemia: Secondary | ICD-10-CM | POA: Diagnosis not present

## 2020-03-15 DIAGNOSIS — N182 Chronic kidney disease, stage 2 (mild): Secondary | ICD-10-CM | POA: Diagnosis not present

## 2020-03-15 DIAGNOSIS — I10 Essential (primary) hypertension: Secondary | ICD-10-CM | POA: Diagnosis not present

## 2020-03-22 NOTE — Progress Notes (Signed)
HPI: Follow-up atrial fibrillation.  Patient recently admitted with encephalopathy and fever and found to have pneumonia.  He developed atrial fibrillation but converted back to sinus rhythm.  Echocardiogram March 2021 showed normal LV function, grade 1 diastolic dysfunction, mild left ventricular hypertrophy, mild left atrial enlargement, trace aortic and mitral regurgitation.  Since discharge patient denies dyspnea, chest pain, palpitations or syncope.  No bleeding.  No falls though he does describe some unsteadiness.  Current Outpatient Medications  Medication Sig Dispense Refill  . amLODipine (NORVASC) 5 MG tablet Take 5 mg by mouth daily.    Marland Kitchen apixaban (ELIQUIS) 5 MG TABS tablet Take 1 tablet (5 mg total) by mouth 2 (two) times daily. 60 tablet 0  . atorvastatin (LIPITOR) 80 MG tablet Take 80 mg by mouth at bedtime.     Marland Kitchen losartan (COZAAR) 100 MG tablet Take 100 mg by mouth daily.    . metFORMIN (GLUCOPHAGE) 1000 MG tablet Take 1,000 mg by mouth 2 (two) times daily with a meal.     . metoprolol tartrate (LOPRESSOR) 25 MG tablet Take 1 tablet (25 mg total) by mouth 2 (two) times daily. 60 tablet 0  . NOVOLIN 70/30 (70-30) 100 UNIT/ML injection Inject 45 Units into the skin 2 (two) times daily with a meal.     . omeprazole (PRILOSEC) 40 MG capsule Take 40 mg by mouth daily.    . Oxycodone HCl 10 MG TABS Take 10 mg by mouth every 4 (four) hours as needed (for pain).     . tadalafil (CIALIS) 5 MG tablet Take 5 mg by mouth daily as needed for erectile dysfunction.    . tamsulosin (FLOMAX) 0.4 MG CAPS capsule Take 0.4 mg by mouth daily.    Marland Kitchen VICTOZA 18 MG/3ML SOPN Inject 1.2 mg into the skin daily.     No current facility-administered medications for this visit.     Past Medical History:  Diagnosis Date  . Diabetes mellitus   . Fall   . FUO (fever of unknown origin) 01/19/2016  . GERD (gastroesophageal reflux disease)   . History of hiatal hernia   . Hypertension   . Occasional  tremors   . Prostatitis 01/19/2016  . Sleep apnea    . Last test was before 2007 , not sure name of test site. uses CPAP  . Syncope   . Wears glasses     Past Surgical History:  Procedure Laterality Date  . BACK SURGERY    . CHOLECYSTECTOMY N/A 06/15/2016   Procedure: LAPAROSCOPIC CHOLECYSTECTOMY WITH INTRAOPERATIVE CHOLANGIOGRAM;  Surgeon: Armandina Gemma, MD;  Location: [facility];  Service: General;  Laterality: N/A;  . Colonoscopy  2011  . EYE SURGERY     Cataract 2013  . HIATAL HERNIA REPAIR     x2  . KNEE ARTHROSCOPY     bil  . LUMBAR FUSION  2013  . PATELLA FRACTURE SURGERY      Social History   Socioeconomic History  . Marital status: Married    Spouse name: Not on file  . Number of children: Not on file  . Years of education: Not on file  . Highest education level: Not on file  Occupational History  . Not on file  Tobacco Use  . Smoking status: Former Smoker    Packs/day: 1.00    Years: 4.00    Pack years: 4.00    Types: Cigarettes    Quit date: 09/04/1980    Years since quitting: 39.5  .  Smokeless tobacco: Never Used  Substance and Sexual Activity  . Alcohol use: No    Alcohol/week: 0.0 standard drinks    Comment: rarely  . Drug use: No  . Sexual activity: Not on file  Other Topics Concern  . Not on file  Social History Narrative  . Not on file   Social Determinants of Health   Financial Resource Strain:   . Difficulty of Paying Living Expenses:   Food Insecurity:   . Worried About Charity fundraiser in the Last Year:   . Arboriculturist in the Last Year:   Transportation Needs:   . Film/video editor (Medical):   Marland Kitchen Lack of Transportation (Non-Medical):   Physical Activity:   . Days of Exercise per Week:   . Minutes of Exercise per Session:   Stress:   . Feeling of Stress :   Social Connections:   . Frequency of Communication with Friends and Family:   . Frequency of Social Gatherings with Friends and Family:   . Attends Religious Services:    . Active Member of Clubs or Organizations:   . Attends Archivist Meetings:   Marland Kitchen Marital Status:   Intimate Partner Violence:   . Fear of Current or Ex-Partner:   . Emotionally Abused:   Marland Kitchen Physically Abused:   . Sexually Abused:     Family History  Problem Relation Age of Onset  . Coronary artery disease Other     ROS: no fevers or chills, productive cough, hemoptysis, dysphasia, odynophagia, melena, hematochezia, dysuria, hematuria, rash, seizure activity, orthopnea, PND, pedal edema, claudication. Remaining systems are negative.  Physical Exam: Well-developed well-nourished in no acute distress.  Skin is warm and dry.  HEENT is normal.  Neck is supple.  Chest is clear to auscultation with normal expansion.  Cardiovascular exam is regular rate and rhythm.  Abdominal exam nontender or distended. No masses palpated. Extremities show no edema. neuro grossly intact    A/P  1 paroxysmal atrial fibrillation-patient remains in sinus rhythm on exam.  Continue metoprolol at present dose.  Patient was asymptomatic with his atrial fibrillation while hospitalized.  At the time I felt his atrial fibrillation may have been related to the stress of his presenting infection.  However it is also possible he could be having asymptomatic bouts at home.  We will therefore continue long-term anticoagulation.  He would prefer to be on Xarelto long-term.  He will complete his present prescription of apixaban and then we will transition to Xarelto 20 mg daily.  I will discontinue his aspirin given need for anticoagulation.  2 hypertension-blood pressure mildly elevated; however he follows this at home and it is typically controlled.  Continue present medications and follow.  Kirk Ruths, MD

## 2020-03-26 ENCOUNTER — Ambulatory Visit: Payer: Medicare HMO | Admitting: Cardiology

## 2020-03-26 ENCOUNTER — Other Ambulatory Visit: Payer: Self-pay

## 2020-03-26 ENCOUNTER — Encounter: Payer: Self-pay | Admitting: Cardiology

## 2020-03-26 VITALS — BP 142/78 | HR 71 | Ht 73.0 in | Wt 243.2 lb

## 2020-03-26 DIAGNOSIS — I10 Essential (primary) hypertension: Secondary | ICD-10-CM

## 2020-03-26 DIAGNOSIS — I480 Paroxysmal atrial fibrillation: Secondary | ICD-10-CM

## 2020-03-26 MED ORDER — RIVAROXABAN 20 MG PO TABS: 20.0000 mg | ORAL_TABLET | Freq: Every day | ORAL | 12 refills | Status: DC

## 2020-03-26 NOTE — Patient Instructions (Signed)
Medication Instructions:  STOP ASPIRIN  STOP ELIQUIS AFTER CURRENT SUPPLY IS COMPLETED THEN CHANGE TO XARELTO 20 MG ONCE DAILY  *If you need a refill on your cardiac medications before your next appointment, please call your pharmacy*   Lab Work: If you have labs (blood work) drawn today and your tests are completely normal, you will receive your results only by: Marland Kitchen MyChart Message (if you have MyChart) OR . A paper copy in the mail If you have any lab test that is abnormal or we need to change your treatment, we will call you to review the results.   Follow-Up: At [facility], you and your health needs are our priority.  As part of our continuing mission to provide you with exceptional heart care, we have created designated Provider Care Teams.  These Care Teams include your primary Cardiologist (physician) and Advanced Practice Providers (APPs -  Physician Assistants and Nurse Practitioners) who all work together to provide you with the care you need, when you need it.  We recommend signing up for the patient portal called "MyChart".  Sign up information is provided on this After Visit Summary.  MyChart is used to connect with patients for Virtual Visits (Telemedicine).  Patients are able to view lab/test results, encounter notes, upcoming appointments, etc.  Non-urgent messages can be sent to your provider as well.   To learn more about what you can do with MyChart, go to [email or web address].    Your next appointment:   6 month(s)  The format for your next appointment:   Either In Person or Virtual  Provider:   You may see Kirk Ruths MD or one of the following Advanced Practice Providers on your designated Care Team:    Kerin Ransom, PA-C  [facility], [place]  Coletta Memos, [place]

## 2020-03-30 ENCOUNTER — Other Ambulatory Visit: Payer: Self-pay | Admitting: Cardiology

## 2020-03-30 MED ORDER — METOPROLOL TARTRATE 25 MG PO TABS: 25.0000 mg | ORAL_TABLET | Freq: Two times a day (BID) | ORAL | 3 refills | Status: DC

## 2020-03-30 NOTE — Telephone Encounter (Signed)
New Message      *STAT* If patient is at the pharmacy, call can be transferred to refill team.   1. Which medications need to be refilled? (please list name of each medication and dose if known)  metoprolol tartrate (LOPRESSOR) 25 MG tablet(Expired)  2. Which pharmacy/location (including street and city if local pharmacy) is medication to be sent to? [facility] [address] [place]  3. Do they need a 30 day or 90 day supply? 90 Depending on cost

## 2020-04-02 NOTE — Telephone Encounter (Signed)
Rx for Metoprolol was sent in on 4/6.

## 2020-04-09 DIAGNOSIS — E1165 Type 2 diabetes mellitus with hyperglycemia: Secondary | ICD-10-CM | POA: Diagnosis not present

## 2020-04-09 DIAGNOSIS — N182 Chronic kidney disease, stage 2 (mild): Secondary | ICD-10-CM | POA: Diagnosis not present

## 2020-04-09 DIAGNOSIS — Z7984 Long term (current) use of oral hypoglycemic drugs: Secondary | ICD-10-CM | POA: Diagnosis not present

## 2020-04-09 DIAGNOSIS — N400 Enlarged prostate without lower urinary tract symptoms: Secondary | ICD-10-CM | POA: Diagnosis not present

## 2020-04-09 DIAGNOSIS — I480 Paroxysmal atrial fibrillation: Secondary | ICD-10-CM | POA: Diagnosis not present

## 2020-04-09 DIAGNOSIS — E1122 Type 2 diabetes mellitus with diabetic chronic kidney disease: Secondary | ICD-10-CM | POA: Diagnosis not present

## 2020-04-09 DIAGNOSIS — I10 Essential (primary) hypertension: Secondary | ICD-10-CM | POA: Diagnosis not present

## 2020-04-09 DIAGNOSIS — E1142 Type 2 diabetes mellitus with diabetic polyneuropathy: Secondary | ICD-10-CM | POA: Diagnosis not present

## 2020-04-09 DIAGNOSIS — I2510 Atherosclerotic heart disease of native coronary artery without angina pectoris: Secondary | ICD-10-CM | POA: Diagnosis not present

## 2020-04-09 DIAGNOSIS — N401 Enlarged prostate with lower urinary tract symptoms: Secondary | ICD-10-CM | POA: Diagnosis not present

## 2020-04-23 DIAGNOSIS — R69 Illness, unspecified: Secondary | ICD-10-CM | POA: Diagnosis not present

## 2020-04-27 DIAGNOSIS — R69 Illness, unspecified: Secondary | ICD-10-CM | POA: Diagnosis not present

## 2020-05-04 ENCOUNTER — Other Ambulatory Visit: Payer: Self-pay | Admitting: Pharmacist

## 2020-05-04 DIAGNOSIS — N401 Enlarged prostate with lower urinary tract symptoms: Secondary | ICD-10-CM | POA: Diagnosis not present

## 2020-05-04 DIAGNOSIS — I10 Essential (primary) hypertension: Secondary | ICD-10-CM | POA: Diagnosis not present

## 2020-05-04 DIAGNOSIS — I480 Paroxysmal atrial fibrillation: Secondary | ICD-10-CM | POA: Diagnosis not present

## 2020-05-04 DIAGNOSIS — E1122 Type 2 diabetes mellitus with diabetic chronic kidney disease: Secondary | ICD-10-CM | POA: Diagnosis not present

## 2020-05-04 DIAGNOSIS — E1142 Type 2 diabetes mellitus with diabetic polyneuropathy: Secondary | ICD-10-CM | POA: Diagnosis not present

## 2020-05-04 DIAGNOSIS — N400 Enlarged prostate without lower urinary tract symptoms: Secondary | ICD-10-CM | POA: Diagnosis not present

## 2020-05-04 DIAGNOSIS — E1165 Type 2 diabetes mellitus with hyperglycemia: Secondary | ICD-10-CM | POA: Diagnosis not present

## 2020-05-04 DIAGNOSIS — I2510 Atherosclerotic heart disease of native coronary artery without angina pectoris: Secondary | ICD-10-CM | POA: Diagnosis not present

## 2020-05-04 DIAGNOSIS — N182 Chronic kidney disease, stage 2 (mild): Secondary | ICD-10-CM | POA: Diagnosis not present

## 2020-05-04 MED ORDER — RIVAROXABAN 20 MG PO TABS: 20.0000 mg | ORAL_TABLET | Freq: Every day | ORAL | 1 refills | Status: DC

## 2020-05-04 MED ORDER — METOPROLOL TARTRATE 25 MG PO TABS: 25.0000 mg | ORAL_TABLET | Freq: Two times a day (BID) | ORAL | 3 refills | Status: DC

## 2020-06-10 ENCOUNTER — Telehealth: Payer: Self-pay | Admitting: Cardiology

## 2020-06-10 NOTE — Telephone Encounter (Signed)
Discussed with pharmacist Raquel who states that as long as the breakfast is substantial, okay to take his xarelto in the morning. She advises not to take the medication with just a snack.  Called and reviewed recommendations with pt. Pt states he usually eats a breakfast biscuit and coffee in the mornings. He verbalized understanding to take his medication with a large meal and not with a snack. No other question from the pt at this time.

## 2020-06-10 NOTE — Telephone Encounter (Signed)
New Message  Pt c/o medication issue:  1. Name of Medication: rivaroxaban (XARELTO) 20 MG TABS tablet  2. How are you currently taking this medication (dosage and times per day)? In the mornings with breakfast  3. Are you having a reaction (difficulty breathing--STAT)? No  4. What is your medication issue? Patient has been taking the medication with Breakfast vs dinner. Wants to know if this is ok. Please call and advise.

## 2020-06-21 DIAGNOSIS — M545 Low back pain: Secondary | ICD-10-CM | POA: Diagnosis not present

## 2020-06-30 DIAGNOSIS — E1142 Type 2 diabetes mellitus with diabetic polyneuropathy: Secondary | ICD-10-CM | POA: Diagnosis not present

## 2020-06-30 DIAGNOSIS — N401 Enlarged prostate with lower urinary tract symptoms: Secondary | ICD-10-CM | POA: Diagnosis not present

## 2020-06-30 DIAGNOSIS — N182 Chronic kidney disease, stage 2 (mild): Secondary | ICD-10-CM | POA: Diagnosis not present

## 2020-06-30 DIAGNOSIS — E1122 Type 2 diabetes mellitus with diabetic chronic kidney disease: Secondary | ICD-10-CM | POA: Diagnosis not present

## 2020-06-30 DIAGNOSIS — I480 Paroxysmal atrial fibrillation: Secondary | ICD-10-CM | POA: Diagnosis not present

## 2020-06-30 DIAGNOSIS — E1165 Type 2 diabetes mellitus with hyperglycemia: Secondary | ICD-10-CM | POA: Diagnosis not present

## 2020-06-30 DIAGNOSIS — I10 Essential (primary) hypertension: Secondary | ICD-10-CM | POA: Diagnosis not present

## 2020-06-30 DIAGNOSIS — N400 Enlarged prostate without lower urinary tract symptoms: Secondary | ICD-10-CM | POA: Diagnosis not present

## 2020-06-30 DIAGNOSIS — I2510 Atherosclerotic heart disease of native coronary artery without angina pectoris: Secondary | ICD-10-CM | POA: Diagnosis not present

## 2020-07-23 DIAGNOSIS — E1165 Type 2 diabetes mellitus with hyperglycemia: Secondary | ICD-10-CM | POA: Diagnosis not present

## 2020-07-23 DIAGNOSIS — Z794 Long term (current) use of insulin: Secondary | ICD-10-CM | POA: Diagnosis not present

## 2020-07-23 DIAGNOSIS — E1142 Type 2 diabetes mellitus with diabetic polyneuropathy: Secondary | ICD-10-CM | POA: Diagnosis not present

## 2020-07-27 DIAGNOSIS — R5383 Other fatigue: Secondary | ICD-10-CM | POA: Diagnosis not present

## 2020-07-27 DIAGNOSIS — R82998 Other abnormal findings in urine: Secondary | ICD-10-CM | POA: Diagnosis not present

## 2020-07-27 DIAGNOSIS — R072 Precordial pain: Secondary | ICD-10-CM | POA: Diagnosis not present

## 2020-07-27 DIAGNOSIS — R1111 Vomiting without nausea: Secondary | ICD-10-CM | POA: Diagnosis not present

## 2020-08-09 DIAGNOSIS — E119 Type 2 diabetes mellitus without complications: Secondary | ICD-10-CM | POA: Diagnosis not present

## 2020-08-09 DIAGNOSIS — H04123 Dry eye syndrome of bilateral lacrimal glands: Secondary | ICD-10-CM | POA: Diagnosis not present

## 2020-08-09 DIAGNOSIS — H26491 Other secondary cataract, right eye: Secondary | ICD-10-CM | POA: Diagnosis not present

## 2020-08-09 DIAGNOSIS — Z961 Presence of intraocular lens: Secondary | ICD-10-CM | POA: Diagnosis not present

## 2020-08-09 DIAGNOSIS — D492 Neoplasm of unspecified behavior of bone, soft tissue, and skin: Secondary | ICD-10-CM | POA: Diagnosis not present

## 2020-08-09 DIAGNOSIS — H5711 Ocular pain, right eye: Secondary | ICD-10-CM | POA: Diagnosis not present

## 2020-08-13 DIAGNOSIS — R319 Hematuria, unspecified: Secondary | ICD-10-CM | POA: Diagnosis not present

## 2020-08-24 DIAGNOSIS — I2510 Atherosclerotic heart disease of native coronary artery without angina pectoris: Secondary | ICD-10-CM | POA: Diagnosis not present

## 2020-08-24 DIAGNOSIS — N182 Chronic kidney disease, stage 2 (mild): Secondary | ICD-10-CM | POA: Diagnosis not present

## 2020-08-24 DIAGNOSIS — E1122 Type 2 diabetes mellitus with diabetic chronic kidney disease: Secondary | ICD-10-CM | POA: Diagnosis not present

## 2020-08-24 DIAGNOSIS — E1142 Type 2 diabetes mellitus with diabetic polyneuropathy: Secondary | ICD-10-CM | POA: Diagnosis not present

## 2020-08-24 DIAGNOSIS — N401 Enlarged prostate with lower urinary tract symptoms: Secondary | ICD-10-CM | POA: Diagnosis not present

## 2020-08-24 DIAGNOSIS — E1165 Type 2 diabetes mellitus with hyperglycemia: Secondary | ICD-10-CM | POA: Diagnosis not present

## 2020-08-24 DIAGNOSIS — N400 Enlarged prostate without lower urinary tract symptoms: Secondary | ICD-10-CM | POA: Diagnosis not present

## 2020-08-24 DIAGNOSIS — M545 Low back pain: Secondary | ICD-10-CM | POA: Diagnosis not present

## 2020-08-24 DIAGNOSIS — I480 Paroxysmal atrial fibrillation: Secondary | ICD-10-CM | POA: Diagnosis not present

## 2020-08-24 DIAGNOSIS — I10 Essential (primary) hypertension: Secondary | ICD-10-CM | POA: Diagnosis not present

## 2020-09-24 DIAGNOSIS — G4733 Obstructive sleep apnea (adult) (pediatric): Secondary | ICD-10-CM | POA: Diagnosis not present

## 2020-09-27 NOTE — Progress Notes (Signed)
HPI: Follow-up atrial fibrillation.  Patient previously admitted with encephalopathy and fever and found to have pneumonia.  He developed atrial fibrillation but converted back to sinus rhythm.  Echocardiogram March 2021 showed normal LV function, grade 1 diastolic dysfunction, mild left ventricular hypertrophy, mild left atrial enlargement, trace aortic and mitral regurgitation.  Since last seen, patient denies dyspnea, chest pain, palpitations, syncope or bleeding.  Current Outpatient Medications  Medication Sig Dispense Refill  . amLODipine (NORVASC) 5 MG tablet Take 5 mg by mouth daily.    Marland Kitchen atorvastatin (LIPITOR) 80 MG tablet Take 80 mg by mouth at bedtime.     Marland Kitchen losartan (COZAAR) 100 MG tablet Take 100 mg by mouth daily.    . metFORMIN (GLUCOPHAGE) 1000 MG tablet Take 1,000 mg by mouth 2 (two) times daily with a meal.     . metoprolol tartrate (LOPRESSOR) 25 MG tablet Take 1 tablet (25 mg total) by mouth 2 (two) times daily. 180 tablet 3  . NOVOLIN 70/30 (70-30) 100 UNIT/ML injection Inject 45 Units into the skin 2 (two) times daily with a meal.     . omeprazole (PRILOSEC) 40 MG capsule Take 40 mg by mouth daily.    . Oxycodone HCl 10 MG TABS Take 10 mg by mouth every 4 (four) hours as needed (for pain).     . rivaroxaban (XARELTO) 20 MG TABS tablet Take 1 tablet (20 mg total) by mouth daily with supper. 90 tablet 1  . tadalafil (CIALIS) 5 MG tablet Take 5 mg by mouth daily as needed for erectile dysfunction.    . tamsulosin (FLOMAX) 0.4 MG CAPS capsule Take 0.4 mg by mouth daily.    Marland Kitchen VICTOZA 18 MG/3ML SOPN Inject 1.2 mg into the skin daily.     No current facility-administered medications for this visit.     Past Medical History:  Diagnosis Date  . Diabetes mellitus   . Fall   . FUO (fever of unknown origin) 01/19/2016  . GERD (gastroesophageal reflux disease)   . History of hiatal hernia   . Hypertension   . Occasional tremors   . Prostatitis 01/19/2016  . Sleep apnea     . Last test was before 2007 , not sure name of test site. uses CPAP  . Syncope   . Wears glasses     Past Surgical History:  Procedure Laterality Date  . BACK SURGERY    . CHOLECYSTECTOMY N/A 06/15/2016   Procedure: LAPAROSCOPIC CHOLECYSTECTOMY WITH INTRAOPERATIVE CHOLANGIOGRAM;  Surgeon: Armandina Gemma, MD;  Location: [facility];  Service: General;  Laterality: N/A;  . Colonoscopy  2011  . EYE SURGERY     Cataract 2013  . HIATAL HERNIA REPAIR     x2  . KNEE ARTHROSCOPY     bil  . LUMBAR FUSION  2013  . PATELLA FRACTURE SURGERY      Social History   Socioeconomic History  . Marital status: Married    Spouse name: Not on file  . Number of children: Not on file  . Years of education: Not on file  . Highest education level: Not on file  Occupational History  . Not on file  Tobacco Use  . Smoking status: Former Smoker    Packs/day: 1.00    Years: 4.00    Pack years: 4.00    Types: Cigarettes    Quit date: 09/04/1980    Years since quitting: 40.1  . Smokeless tobacco: Never Used  Substance and Sexual Activity  .  Alcohol use: No    Alcohol/week: 0.0 standard drinks    Comment: rarely  . Drug use: No  . Sexual activity: Not on file  Other Topics Concern  . Not on file  Social History Narrative  . Not on file   Social Determinants of Health   Financial Resource Strain:   . Difficulty of Paying Living Expenses: Not on file  Food Insecurity:   . Worried About Charity fundraiser in the Last Year: Not on file  . Ran Out of Food in the Last Year: Not on file  Transportation Needs:   . Lack of Transportation (Medical): Not on file  . Lack of Transportation (Non-Medical): Not on file  Physical Activity:   . Days of Exercise per Week: Not on file  . Minutes of Exercise per Session: Not on file  Stress:   . Feeling of Stress : Not on file  Social Connections:   . Frequency of Communication with Friends and Family: Not on file  . Frequency of Social Gatherings with  Friends and Family: Not on file  . Attends Religious Services: Not on file  . Active Member of Clubs or Organizations: Not on file  . Attends Archivist Meetings: Not on file  . Marital Status: Not on file  Intimate Partner Violence:   . Fear of Current or Ex-Partner: Not on file  . Emotionally Abused: Not on file  . Physically Abused: Not on file  . Sexually Abused: Not on file    Family History  Problem Relation Age of Onset  . Coronary artery disease Other     ROS: no fevers or chills, productive cough, hemoptysis, dysphasia, odynophagia, melena, hematochezia, dysuria, hematuria, rash, seizure activity, orthopnea, PND, pedal edema, claudication. Remaining systems are negative.  Physical Exam: Well-developed well-nourished in no acute distress.  Skin is warm and dry.  HEENT is normal.  Neck is supple.  Chest is clear to auscultation with normal expansion.  Cardiovascular exam is regular rate and rhythm.  Abdominal exam nontender or distended. No masses palpated. Extremities show no edema. neuro grossly intact   A/P  1 paroxysmal atrial fibrillation-patient remains in sinus rhythm today.  Continue metoprolol.  Patient was asymptomatic at the time of his previous atrial fibrillation.  It also occurred in the setting of infection.  However I was concerned that he may be having a symptomatic bouts at home.  We have therefore elected to continue long-term anticoagulation.  Continue Xarelto.  Check hemoglobin and renal function.  2 hypertension-patient's blood pressure is controlled.  Continue present medical regimen.  Kirk Ruths, MD

## 2020-10-01 DIAGNOSIS — I480 Paroxysmal atrial fibrillation: Secondary | ICD-10-CM | POA: Diagnosis not present

## 2020-10-01 DIAGNOSIS — I10 Essential (primary) hypertension: Secondary | ICD-10-CM | POA: Diagnosis not present

## 2020-10-01 DIAGNOSIS — E1142 Type 2 diabetes mellitus with diabetic polyneuropathy: Secondary | ICD-10-CM | POA: Diagnosis not present

## 2020-10-01 DIAGNOSIS — N182 Chronic kidney disease, stage 2 (mild): Secondary | ICD-10-CM | POA: Diagnosis not present

## 2020-10-01 DIAGNOSIS — N401 Enlarged prostate with lower urinary tract symptoms: Secondary | ICD-10-CM | POA: Diagnosis not present

## 2020-10-01 DIAGNOSIS — N400 Enlarged prostate without lower urinary tract symptoms: Secondary | ICD-10-CM | POA: Diagnosis not present

## 2020-10-01 DIAGNOSIS — E1165 Type 2 diabetes mellitus with hyperglycemia: Secondary | ICD-10-CM | POA: Diagnosis not present

## 2020-10-01 DIAGNOSIS — I2510 Atherosclerotic heart disease of native coronary artery without angina pectoris: Secondary | ICD-10-CM | POA: Diagnosis not present

## 2020-10-01 DIAGNOSIS — E1122 Type 2 diabetes mellitus with diabetic chronic kidney disease: Secondary | ICD-10-CM | POA: Diagnosis not present

## 2020-10-04 DIAGNOSIS — R351 Nocturia: Secondary | ICD-10-CM | POA: Diagnosis not present

## 2020-10-04 DIAGNOSIS — R8279 Other abnormal findings on microbiological examination of urine: Secondary | ICD-10-CM | POA: Diagnosis not present

## 2020-10-04 DIAGNOSIS — R8271 Bacteriuria: Secondary | ICD-10-CM | POA: Diagnosis not present

## 2020-10-04 DIAGNOSIS — N401 Enlarged prostate with lower urinary tract symptoms: Secondary | ICD-10-CM | POA: Diagnosis not present

## 2020-10-06 ENCOUNTER — Encounter: Payer: Self-pay | Admitting: Cardiology

## 2020-10-06 ENCOUNTER — Ambulatory Visit: Payer: Medicare HMO | Admitting: Cardiology

## 2020-10-06 ENCOUNTER — Other Ambulatory Visit: Payer: Self-pay

## 2020-10-06 VITALS — BP 116/60 | HR 76 | Ht 72.0 in | Wt 246.4 lb

## 2020-10-06 DIAGNOSIS — I480 Paroxysmal atrial fibrillation: Secondary | ICD-10-CM | POA: Diagnosis not present

## 2020-10-06 DIAGNOSIS — I10 Essential (primary) hypertension: Secondary | ICD-10-CM

## 2020-10-06 NOTE — Patient Instructions (Signed)

## 2020-10-07 LAB — BASIC METABOLIC PANEL
BUN/Creatinine Ratio: 12 (ref 10–24)
BUN: 12 mg/dL (ref 8–27)
CO2: 24 mmol/L (ref 20–29)
Calcium: 9.2 mg/dL (ref 8.6–10.2)
Chloride: 103 mmol/L (ref 96–106)
Creatinine, Ser: 1.02 mg/dL (ref 0.76–1.27)
GFR calc Af Amer: 83 mL/min/{1.73_m2} (ref >59)
GFR calc non Af Amer: 72 mL/min/{1.73_m2} (ref >59)
Glucose: 158 mg/dL — ABNORMAL HIGH (ref 65–99)
Potassium: 4.5 mmol/L (ref 3.5–5.2)
Sodium: 140 mmol/L (ref 134–144)

## 2020-10-07 LAB — CBC
Hematocrit: 38.4 % (ref 37.5–51.0)
Hemoglobin: 12.2 g/dL — ABNORMAL LOW (ref 13.0–17.7)
MCH: 27.5 pg (ref 26.6–33.0)
MCHC: 31.8 g/dL (ref 31.5–35.7)
MCV: 87 fL (ref 79–97)
Platelets: 217 10*3/uL (ref 150–450)
RBC: 4.43 x10E6/uL (ref 4.14–5.80)
RDW: 14.2 % (ref 11.6–15.4)
WBC: 5.4 10*3/uL (ref 3.4–10.8)

## 2020-10-25 DIAGNOSIS — G4733 Obstructive sleep apnea (adult) (pediatric): Secondary | ICD-10-CM | POA: Diagnosis not present

## 2020-11-01 DIAGNOSIS — R69 Illness, unspecified: Secondary | ICD-10-CM | POA: Diagnosis not present

## 2020-11-08 DIAGNOSIS — Z0000 Encounter for general adult medical examination without abnormal findings: Secondary | ICD-10-CM | POA: Diagnosis not present

## 2020-11-08 DIAGNOSIS — K219 Gastro-esophageal reflux disease without esophagitis: Secondary | ICD-10-CM | POA: Diagnosis not present

## 2020-11-08 DIAGNOSIS — I480 Paroxysmal atrial fibrillation: Secondary | ICD-10-CM | POA: Diagnosis not present

## 2020-11-08 DIAGNOSIS — N401 Enlarged prostate with lower urinary tract symptoms: Secondary | ICD-10-CM | POA: Diagnosis not present

## 2020-11-08 DIAGNOSIS — Z1389 Encounter for screening for other disorder: Secondary | ICD-10-CM | POA: Diagnosis not present

## 2020-11-08 DIAGNOSIS — G250 Essential tremor: Secondary | ICD-10-CM | POA: Diagnosis not present

## 2020-11-08 DIAGNOSIS — G4733 Obstructive sleep apnea (adult) (pediatric): Secondary | ICD-10-CM | POA: Diagnosis not present

## 2020-11-08 DIAGNOSIS — Z1211 Encounter for screening for malignant neoplasm of colon: Secondary | ICD-10-CM | POA: Diagnosis not present

## 2020-11-08 DIAGNOSIS — R269 Unspecified abnormalities of gait and mobility: Secondary | ICD-10-CM | POA: Diagnosis not present

## 2020-11-08 DIAGNOSIS — I700 Atherosclerosis of aorta: Secondary | ICD-10-CM | POA: Diagnosis not present

## 2020-11-08 DIAGNOSIS — I10 Essential (primary) hypertension: Secondary | ICD-10-CM | POA: Diagnosis not present

## 2020-11-23 DIAGNOSIS — M961 Postlaminectomy syndrome, not elsewhere classified: Secondary | ICD-10-CM | POA: Diagnosis not present

## 2020-11-23 DIAGNOSIS — Z79891 Long term (current) use of opiate analgesic: Secondary | ICD-10-CM | POA: Diagnosis not present

## 2020-11-24 DIAGNOSIS — G4733 Obstructive sleep apnea (adult) (pediatric): Secondary | ICD-10-CM | POA: Diagnosis not present

## 2020-12-01 ENCOUNTER — Ambulatory Visit: Payer: Medicare HMO | Attending: Internal Medicine | Admitting: Physical Therapy

## 2020-12-01 ENCOUNTER — Encounter: Payer: Self-pay | Admitting: Physical Therapy

## 2020-12-01 ENCOUNTER — Other Ambulatory Visit: Payer: Self-pay

## 2020-12-01 DIAGNOSIS — I480 Paroxysmal atrial fibrillation: Secondary | ICD-10-CM | POA: Diagnosis not present

## 2020-12-01 DIAGNOSIS — G8929 Other chronic pain: Secondary | ICD-10-CM

## 2020-12-01 DIAGNOSIS — E1122 Type 2 diabetes mellitus with diabetic chronic kidney disease: Secondary | ICD-10-CM | POA: Diagnosis not present

## 2020-12-01 DIAGNOSIS — K219 Gastro-esophageal reflux disease without esophagitis: Secondary | ICD-10-CM | POA: Diagnosis not present

## 2020-12-01 DIAGNOSIS — R2689 Other abnormalities of gait and mobility: Secondary | ICD-10-CM | POA: Diagnosis not present

## 2020-12-01 DIAGNOSIS — N182 Chronic kidney disease, stage 2 (mild): Secondary | ICD-10-CM | POA: Diagnosis not present

## 2020-12-01 DIAGNOSIS — N400 Enlarged prostate without lower urinary tract symptoms: Secondary | ICD-10-CM | POA: Diagnosis not present

## 2020-12-01 DIAGNOSIS — N401 Enlarged prostate with lower urinary tract symptoms: Secondary | ICD-10-CM | POA: Diagnosis not present

## 2020-12-01 DIAGNOSIS — E1165 Type 2 diabetes mellitus with hyperglycemia: Secondary | ICD-10-CM | POA: Diagnosis not present

## 2020-12-01 DIAGNOSIS — E1142 Type 2 diabetes mellitus with diabetic polyneuropathy: Secondary | ICD-10-CM | POA: Diagnosis not present

## 2020-12-01 DIAGNOSIS — I10 Essential (primary) hypertension: Secondary | ICD-10-CM | POA: Diagnosis not present

## 2020-12-01 DIAGNOSIS — M545 Low back pain, unspecified: Secondary | ICD-10-CM | POA: Insufficient documentation

## 2020-12-01 DIAGNOSIS — I2510 Atherosclerotic heart disease of native coronary artery without angina pectoris: Secondary | ICD-10-CM | POA: Diagnosis not present

## 2020-12-01 NOTE — Patient Instructions (Signed)
Access Code: [email or web address] URL: [email or web address] Date: 12/01/2020 Prepared by: [place].  Exercises Sit to Stand with Hands on Knees - 1 x daily - 7 x weekly - 3 sets - 10 reps Standing Tandem Balance with Counter Support - 1 x daily - 7 x weekly - 3 sets - 10 reps Seated Hamstring Stretch - 1 x daily - 7 x weekly - 3 sets - 10 reps

## 2020-12-01 NOTE — Therapy (Addendum)
[place], [place], [address] Phone: [phone]   Fax:  [phone]  Physical Therapy Evaluation  Patient Details  Name: Kevin [name] MRN: [id number] Date of Birth: [birth year] Referring Provider (PT):  Lavone Orn, MD (11/09/2020)   Encounter Date: 12/01/2020   PT End of Session - 12/01/20 1428     Visit Number 1    Number of Visits 7    Date for PT Re-Evaluation 01/12/21    Authorization Type [employer] MCR - FOTO at 6th and 10th visit    Progress Note Due on Visit 10    PT Start Time 1420   Pt arrived late   PT Stop Time 1506    PT Time Calculation (min) 46 min    Equipment Utilized During Treatment Other (comment)   Pt utilized [facility]   Activity Tolerance Patient tolerated treatment well    Behavior During Therapy [facility] for tasks assessed/performed             Past Medical History:  Diagnosis Date   Diabetes mellitus    Fall    FUO (fever of unknown origin) 01/19/2016   GERD (gastroesophageal reflux disease)    History of hiatal hernia    Hypertension    Occasional tremors    Prostatitis 01/19/2016   Sleep apnea    . Last test was before 2007 , not sure name of test site. uses CPAP   Syncope    Wears glasses     Past Surgical History:  Procedure Laterality Date   BACK SURGERY     CHOLECYSTECTOMY N/A 06/15/2016   Procedure: LAPAROSCOPIC CHOLECYSTECTOMY WITH INTRAOPERATIVE CHOLANGIOGRAM;  Surgeon: Armandina Gemma, MD;  Location: [facility];  Service: General;  Laterality: N/A;   Colonoscopy  2011   EYE SURGERY     Cataract 2013   HIATAL HERNIA REPAIR     x2   KNEE ARTHROSCOPY     bil   LUMBAR FUSION  2013   PATELLA FRACTURE SURGERY      There were no vitals filed for this visit.    Subjective Assessment - 12/01/20 1441     Subjective "I have persistant pain in the lower back that seems to be getting progressively worse. I was having trouble with my bed which I think I was causing my  problems so I purchased a Sleep Number number bed in September and I sleep at a 40, and it's been better since then but it's still there. I have no pain when I get out of bed or sitting here immobile in this chair, but as I continue to move and walk around the pain starts in my lower back. Lumbar fusion L3-L4-L5 but a screw snapped but remained in place so it's supposedly still working. I suffer from essential tremors and I can't write and it bothers me." Pt denies N/T or change in B&B habits. Pt denies radicular symptoms.    Pertinent History Lumbar Fusion L3-4-5    Limitations Sitting;Walking;Standing;Writing    How long can you sit comfortably? As long as I'm stationary, I feel no pain    How long can you stand comfortably? pain immediately upon standing    How long can you walk comfortably? pain immediately upon initiation of walking    Patient Stated Goals Determine what exercises could help me reduce pain and get back to walking    Currently in Pain? Yes    Pain Score 4    best 0/10 when  immobile   Pain Location Back    Pain Orientation Lower;Right;Medial    Pain Descriptors / Indicators Aching    Pain Type Chronic pain    Pain Onset More than a month ago    Pain Frequency Constant    Aggravating Factors  Getting out of the car, reaching into the trunk    Pain Relieving Factors pain medication, remaining still like right nowr    Effect of Pain on Daily Activities Limiting walking                  [facility] PT Assessment - 12/01/20 0001       Assessment   Medical Diagnosis Unspecified abnormalities of gait and mobility R26.9    Referring Provider (PT)  Lavone Orn, MD (11/09/2020)    Hand Dominance Right    Next MD Visit 03/25/2021    Prior Therapy Yes      Precautions   Precautions None      Restrictions   Weight Bearing Restrictions No      Balance Screen   Has the patient fallen in the past 6 months No   near misses recently attributed to medication   Has the patient  had a decrease in activity level because of a fear of falling?  Yes    Is the patient reluctant to leave their home because of a fear of falling?  No      Prior Function   Vocation Retired   Marketing executive of employee relations     Cognition   Overall Cognitive Status Within Functional Limits for tasks assessed    Attention Focused      Observation/Other Assessments   Focus on Therapeutic Outcomes (FOTO)  54% limited on eval 12/01/2020      Coordination   Gross Motor Movements are Fluid and Coordinated Yes      Functional Tests   Functional tests --      Posture/Postural Control   Posture/Postural Control Postural limitations    Postural Limitations Rounded Shoulders;Forward head;Decreased lumbar lordosis;Flexed trunk      ROM / Strength   AROM / PROM / Strength AROM;Strength      AROM   Overall AROM  Within functional limits for tasks performed    Overall AROM Comments WFL per visual assessment but no measured    AROM Assessment Site Lumbar    Lumbar - Right Side Bend --   reproduced CC pain     Strength   Strength Assessment Site --      Ambulation/Gait   Ambulation/Gait Yes    Ambulation/Gait Assistance 6: Modified independent (Device/Increase time)    Ambulation Distance (Feet) 100 Feet    Assistive device Other (Comment)   Pt utilized umbrella because they forgot their [facility]   Gait Pattern Step-through pattern;Decreased step length - right;Abducted- right   Right LE in strong ER, some ER on left   Ambulation Surface Level;Indoor    Gait Comments Pt utilized umbrella as SPC on the right, left hand placed on lumbar spine "for support" with forward trunk lean. Pt ambulated at decreased pace and near wall for external support      Balance   Balance Assessed Yes      Standardized Balance Assessment   Standardized Balance Assessment --                                Objective measurements completed on examination: See  above findings.          [place]  Adult PT Treatment/Exercise - 12/01/20 0001       Balance   Balance Assessed Yes      Standardized Balance Assessment   Standardized Balance Assessment --   mCTSIB 10s, 10s, unable, NT     Lumbar Exercises: Stretches   Active Hamstring Stretch Right;Left;1 rep;30 seconds    Active Hamstring Stretch Limitations VC to not flex trunk or rotate      Lumbar Exercises: Seated   Sit to Stand --   2 reps   Sit to Stand Limitations VC to not plop      Ankle Exercises: Standing   Other Standing Ankle Exercises Standing semi-tandem at counter for HHA working up to 30s,                          PT Education - 12/01/20 1434     Education Details Provided and Reviewed HEP, progrnosis, POC    Person(s) Educated Patient    Methods Explanation;Handout    Comprehension Verbalized understanding              PT Short Term Goals - 12/01/20 1539       PT SHORT TERM GOAL #1   Title Pt will be IND with HEP    Time 3    Period Weeks    Status New    Target Date 12/22/20      PT SHORT TERM GOAL #2   Title Pt will report increased time standing/walking to 5 min prior to pain onset    Time 3    Period Weeks    Status New    Target Date 12/22/20      PT SHORT TERM GOAL #3   Title Pt will be assessed on BERG, 5xSTS, and 10MWT (or other endurance measure), educated on results, and goals updated accordingly    Time 3    Period Weeks    Status New    Target Date 12/22/20                PT Long Term Goals - 12/01/20 1542       PT LONG TERM GOAL #1   Title Pt will be IND with all provided HEP to promote independence and functional mobility    Time 6    Period Weeks    Status New    Target Date 01/12/21      PT LONG TERM GOAL #2   Title Pt's gross lower quadrant strength will be measured >/= 4/5 to promote functional gait and reduce fall risk    Baseline hip and LE strength will be assessed      PT LONG TERM GOAL #3   Title Pt will report decreased pain with  standing and walking </= 4/10 with increased time duration of said activity to 10-20 minutes before pain onset    Time 6    Period Weeks    Status New    Target Date 01/12/21      PT LONG TERM GOAL #4   Title Pt will be be able to assume tandem and SLS for at least 10seconds to demonstrate reduced fall risk    Time 6    Period Weeks    Status New    Target Date 01/12/21  Plan - 12/01/20 1435     Clinical Impression Statement Patient is a 74 year old male who presents to OPPT with CC of chronic low back pain that generally is right-sided that is reproduced with right lumbar sidebending, suggestive of lumbar facet dysfunction. mCTSIB was performed and pt was unable to assume tandem stance or SLS. Pt will benefit from therapy to reduce pain, improve balance and gait, and reduce limitations to reduce fall risk and improve quality of life.    Personal Factors and Comorbidities Comorbidity 3+    Comorbidities Hx of lumbar fusion, DM, HTN, essential tremor    Examination-Activity Limitations Bend;Lift;Stand;Stairs;Squat;Sit;Locomotion Level;Transfers    Stability/Clinical Decision Making Evolving/Moderate complexity    Clinical Decision Making Moderate    Rehab Potential Good    PT Frequency 1x / week    PT Duration 6 weeks    PT Treatment/Interventions ADLs/Self Care Home Management;Cryotherapy;DME Instruction;Moist Heat;Gait training;Stair training;Functional mobility training;Therapeutic activities;Therapeutic exercise;Balance training;Neuromuscular re-education;Manual techniques;Patient/family education;Dry needling;Energy conservation;Joint Manipulations;Taping;Aquatic Therapy;Iontophoresis 4mg /ml Dexamethasone    PT Next Visit Plan MMT Hip/Knee/Ankle, palpate and assess spinal mobility, BERG, 10MWT (or 2 minute walk test?), 5xSTS, update and progress HEP with results.    PT Home Exercise Plan [id number] - seated hamstring stretch, tandem stance at counter,  STS with UE    Consulted and Agree with Plan of Care Patient             Patient will benefit from skilled therapeutic intervention in order to improve the following deficits and impairments:  Abnormal gait, Decreased activity tolerance, Decreased balance, Decreased mobility, Decreased endurance, Decreased range of motion, Decreased strength, Hypomobility, Difficulty walking, Pain  Visit Diagnosis: Other abnormalities of gait and mobility  Chronic right-sided low back pain, unspecified whether sciatica present      Problem List Patient Active Problem List   Diagnosis Date Noted   New onset atrial fibrillation ([place]) 02/28/2020   Sepsis due to undetermined organism ([employer]) 57/90/3833   Acute metabolic encephalopathy 38/32/9191   Chronic pain 02/26/2020   Cholelithiasis with chronic cholecystitis 06/15/2016   Cholelithiasis with cholecystitis 06/14/2016   FUO (fever of unknown origin) 01/19/2016   Prostatitis 01/19/2016   Pseudomonas aeruginosa infection 01/21/2015   Vertebral osteomyelitis ([place]) 04/24/2013   Diskitis 01/23/2013   Swelling of arm 01/23/2013   Encephalopathy acute 12/28/2012   UTI (lower urinary tract infection) 12/28/2012   Spondylolisthesis of lumbar region 08/30/2012   COUGH VARIANT ASTHMA 10/09/2008   Essential hypertension 09/12/2008   Sleep apnea 09/12/2008   COUGH 09/12/2008   ALLERGIC RHINITIS 09/04/2008   Uncontrolled type 2 diabetes mellitus with hyperglycemia, with long-term current use of insulin ([place]) 09/03/2008   Dyslipidemia 09/03/2008   Class 1 obesity with body mass index (BMI) of 33.0 to 33.9 in adult 09/03/2008   ANEURYSM, THORACIC AORTIC 09/03/2008   ARTHRITIS 09/03/2008    Dawayne Cirri, SPT 12/01/2020, 4:07 PM  [facility] [address] [place], [place], [address] Phone: [phone]   Fax:  [phone]  Name: Kevin [name] MRN: [id number] Date of Birth: 1946/06/12

## 2020-12-08 DIAGNOSIS — R82998 Other abnormal findings in urine: Secondary | ICD-10-CM | POA: Diagnosis not present

## 2020-12-08 DIAGNOSIS — R1011 Right upper quadrant pain: Secondary | ICD-10-CM | POA: Diagnosis not present

## 2020-12-15 DIAGNOSIS — Z1211 Encounter for screening for malignant neoplasm of colon: Secondary | ICD-10-CM | POA: Diagnosis not present

## 2020-12-15 DIAGNOSIS — I480 Paroxysmal atrial fibrillation: Secondary | ICD-10-CM | POA: Diagnosis not present

## 2020-12-15 DIAGNOSIS — I2510 Atherosclerotic heart disease of native coronary artery without angina pectoris: Secondary | ICD-10-CM | POA: Diagnosis not present

## 2020-12-16 ENCOUNTER — Telehealth: Payer: Self-pay

## 2020-12-16 NOTE — Telephone Encounter (Signed)
   [facility] Pre-operative Risk Assessment    Request for surgical clearance:  1. What type of surgery is being performed? COLONOSCOPY   2. When is this surgery scheduled? 01-12-2021   3. What type of clearance is required (medical clearance vs. Pharmacy clearance to hold med vs. Both)? PHARMACY  4. Are there any medications that need to be held prior to surgery and how long? XARELTO 2 DAYS   5. Practice name and name of physician performing surgery? [facility]   6. What is the office phone number? [phone]   7.   What is the office fax number? [phone]  8.   Anesthesia type (None, local, MAC, general) ? PROPOFOL

## 2020-12-16 NOTE — Telephone Encounter (Signed)
   Primary Cardiologist: Kirk Ruths, MD  Chart reviewed as part of pre-operative protocol coverage. Given past medical history and time since last visit, based on ACC/AHA guidelines, Kevin [name] would be at acceptable risk for the planned procedure without further cardiovascular testing.   Patient with diagnosis of afib on Xarelto for anticoagulation.    Procedure: colonoscopy Date of procedure: 01/12/21  CHA2DS2-VASc Score = 4  This indicates a 4.8% annual risk of stroke. The patient's score is based upon: CHF History: No HTN History: Yes Diabetes History: Yes Stroke History: No Vascular Disease History: No Age Score: 2 Gender Score: 0  CrCl 65mL/min using adjusted body weight Platelet count 217K  Per office protocol, patient can hold Xarelto for 2 days prior to procedure as requested.  I will route this recommendation to the requesting party via Epic fax function and remove from pre-op pool.  Please call with questions.  Jossie Ng. Cortavious Nix NP-C    12/16/2020, 9:02 AM [address] Office [phone] Fax [phone]

## 2020-12-16 NOTE — Telephone Encounter (Signed)
Patient with diagnosis of afib on Xarelto for anticoagulation.    Procedure: colonoscopy Date of procedure: 01/12/21  CHA2DS2-VASc Score = 4  This indicates a 4.8% annual risk of stroke. The patient's score is based upon: CHF History: No HTN History: Yes Diabetes History: Yes Stroke History: No Vascular Disease History: No Age Score: 2 Gender Score: 0  CrCl 74mL/min using adjusted body weight Platelet count 217K  Per office protocol, patient can hold Xarelto for 2 days prior to procedure as requested.

## 2020-12-21 ENCOUNTER — Ambulatory Visit: Payer: Medicare HMO | Admitting: Physical Therapy

## 2020-12-21 ENCOUNTER — Other Ambulatory Visit: Payer: Self-pay

## 2020-12-21 ENCOUNTER — Encounter: Payer: Self-pay | Admitting: Physical Therapy

## 2020-12-21 DIAGNOSIS — M545 Low back pain, unspecified: Secondary | ICD-10-CM

## 2020-12-21 DIAGNOSIS — R2689 Other abnormalities of gait and mobility: Secondary | ICD-10-CM | POA: Diagnosis not present

## 2020-12-21 DIAGNOSIS — G8929 Other chronic pain: Secondary | ICD-10-CM

## 2020-12-21 NOTE — Therapy (Signed)
[place], [place], [address] Phone: [phone]   Fax:  [phone]  Physical Therapy Treatment  Patient Details  Name: Kevin [name] MRN: [id number] Date of Birth: 1946/04/28 Referring Provider (PT):  Lavone Orn, MD (11/09/2020)   Encounter Date: 12/21/2020   PT End of Session - 12/21/20 1543    Visit Number 2    Number of Visits 7    Date for PT Re-Evaluation 01/12/21    Authorization Type [employer]na MCR - FOTO at 6th and 10th visit    Progress Note Due on Visit 10    PT Start Time 1534    PT Stop Time 1615    PT Time Calculation (min) 41 min    Activity Tolerance Patient tolerated treatment well    Behavior During Therapy [facility] for tasks assessed/performed           Past Medical History:  Diagnosis Date  . Diabetes mellitus   . Fall   . FUO (fever of unknown origin) 01/19/2016  . GERD (gastroesophageal reflux disease)   . History of hiatal hernia   . Hypertension   . Occasional tremors   . Prostatitis 01/19/2016  . Sleep apnea    . Last test was before 2007 , not sure name of test site. uses CPAP  . Syncope   . Wears glasses     Past Surgical History:  Procedure Laterality Date  . BACK SURGERY    . CHOLECYSTECTOMY N/A 06/15/2016   Procedure: LAPAROSCOPIC CHOLECYSTECTOMY WITH INTRAOPERATIVE CHOLANGIOGRAM;  Surgeon: Armandina Gemma, MD;  Location: [facility];  Service: General;  Laterality: N/A;  . Colonoscopy  2011  . EYE SURGERY     Cataract 2013  . HIATAL HERNIA REPAIR     x2  . KNEE ARTHROSCOPY     bil  . LUMBAR FUSION  2013  . PATELLA FRACTURE SURGERY      There were no vitals filed for this visit.   Subjective Assessment - 12/21/20 1540    Subjective Patient reports he cannot do his sit<>stand exercise without using his hands, states no probelm with the tandem stance exercise, also no problem with seated hamstring stretch.    Patient Stated Goals Determine what exercises could help  me reduce pain and get back to walking    Currently in Pain? Yes    Pain Score 3     Pain Location Back    Pain Orientation Lower    Pain Descriptors / Indicators Aching    Pain Type Chronic pain    Pain Onset More than a month ago    Pain Frequency Constant                             [facility] Adult PT Treatment/Exercise - 12/21/20 0001      Exercises   Exercises Lumbar      Lumbar Exercises: Stretches   Passive Hamstring Stretch 20 seconds    Passive Hamstring Stretch Limitations seated edge of mat    Single Knee to Chest Stretch 2 reps;20 seconds    Single Knee to Chest Stretch Limitations supine    Lower Trunk Rotation 3 reps;10 seconds    Piriformis Stretch 2 reps;20 seconds    Piriformis Stretch Limitations supine PROM      Lumbar Exercises: Aerobic   Nustep L5 x 5 min with LE only      Lumbar Exercises: Standing   Other  Standing Lumbar Exercises Reviewed tandem stance at counter, hovering hands above counter to work on balance      Lumbar Exercises: Seated   Sit to Stand Limitations reviewed STS using UE assist with stand and slowly lowering without UE assist      Lumbar Exercises: Supine   Bridge Limitations Trialed birgde but patient unable to perform    Straight Leg Raise 10 reps    Straight Leg Raises Limitations cueing to keep knee straight      Lumbar Exercises: Sidelying   Clam 10 reps    Clam Limitations cueing to avoid rolling back                  PT Education - 12/21/20 1542    Education Details HEP    Person(s) Educated Patient    Methods Explanation;Handout    Comprehension Verbalized understanding;Need further instruction            PT Short Term Goals - 12/01/20 1539      PT SHORT TERM GOAL #1   Title Pt will be IND with HEP    Time 3    Period Weeks    Status New    Target Date 12/22/20      PT SHORT TERM GOAL #2   Title Pt will report increased time standing/walking to 5 min prior to pain onset    Time 3     Period Weeks    Status New    Target Date 12/22/20      PT SHORT TERM GOAL #3   Title Pt will be assessed on BERG, 5xSTS, and (or other endurance measure), educated on results, and goals updated accordingly    Time 3    Period Weeks    Status New    Target Date 12/22/20             PT Long Term Goals - 12/01/20 1542      PT LONG TERM GOAL #1   Title Pt will be IND with all provided HEP to promote independence and functional mobility    Time 6    Period Weeks    Status New    Target Date 01/12/21      PT LONG TERM GOAL #2   Title Pt's gross lower quadrant strength will be measured >/= 4/5 to promote functional gait and reduce fall risk    Baseline hip and LE strength will be assessed      PT LONG TERM GOAL #3   Title Pt will report decreased pain with standing and walking </= 4/10 with increased time duration of said activity to 10-20 minutes before pain onset    Time 6    Period Weeks    Status New    Target Date 01/12/21      PT LONG TERM GOAL #4   Title Pt will be be able to assume tandem and SLS for at least 10seconds to demonstrate reduced fall risk    Time 6    Period Weeks    Status New    Target Date 01/12/21                 Plan - 12/21/20 1623    Clinical Impression Statement Patient tolerated therapy well with no adverse effects. Therapy focused on progressing mobility and strengthening exercises with good tolerance. Patient did demonstrate greater difficulty with strengthening on left side. He required consistent cueing for technique for exercise and stated that his neuropathy  affects his ability to tell where his legs are in space. Patient also required extra time for transfers and bed mobility. Patient would benefit from continued skilled PT to reduce pain, improve balance and gait, and reduce limitations to reduce fall risk and improve quality of life.    PT Treatment/Interventions ADLs/Self Care Home Management;Cryotherapy;DME  Instruction;Moist Heat;Gait training;Stair training;Functional mobility training;Therapeutic activities;Therapeutic exercise;Balance training;Neuromuscular re-education;Manual techniques;Patient/family education;Dry needling;Energy conservation;Joint Manipulations;Taping;Aquatic Therapy;Iontophoresis 4mg /ml Dexamethasone    PT Next Visit Plan MMT Hip/Knee/Ankle, palpate and assess spinal mobility, BERG, (or 2 minute walk test?), 5xSTS, update and progress HEP with results.    PT Home Exercise Plan [id number] - LTR, piriformis stretch, SLR, side clamshell, seated hamstring stretch, tandem stance at counter, STS with UE    Consulted and Agree with Plan of Care Patient           Patient will benefit from skilled therapeutic intervention in order to improve the following deficits and impairments:  Abnormal gait,Decreased activity tolerance,Decreased balance,Decreased mobility,Decreased endurance,Decreased range of motion,Decreased strength,Hypomobility,Difficulty walking,Pain  Visit Diagnosis: Other abnormalities of gait and mobility  Chronic right-sided low back pain, unspecified whether sciatica present     Problem List Patient Active Problem List   Diagnosis Date Noted  . New onset atrial fibrillation ([facility]) 02/28/2020  . Sepsis due to undetermined organism ([facility]) 02/26/2020  . Acute metabolic encephalopathy 02/26/2020  . Chronic pain 02/26/2020  . Cholelithiasis with chronic cholecystitis 06/15/2016  . Cholelithiasis with cholecystitis 06/14/2016  . FUO (fever of unknown origin) 01/19/2016  . Prostatitis 01/19/2016  . Pseudomonas aeruginosa infection 01/21/2015  . Vertebral osteomyelitis ([facility]) 04/24/2013  . Diskitis 01/23/2013  . Swelling of arm 01/23/2013  . Encephalopathy acute 12/28/2012  . UTI (lower urinary tract infection) 12/28/2012  . Spondylolisthesis of lumbar region 08/30/2012  . COUGH VARIANT ASTHMA 10/09/2008  . Essential hypertension 09/12/2008  . Sleep apnea  09/12/2008  . COUGH 09/12/2008  . ALLERGIC RHINITIS 09/04/2008  . Uncontrolled type 2 diabetes mellitus with hyperglycemia, with long-term current use of insulin ([facility]) 09/03/2008  . Dyslipidemia 09/03/2008  . Class 1 obesity with body mass index (BMI) of 33.0 to 33.9 in adult 09/03/2008  . 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 ARTHRITIS 09/03/2008    11/03/2008, PT, DPT, LAT, ATC 12/21/20  4:38 PM Phone: [phone] Fax: [phone]   [facility] [address] [place], [place], [place] Phone: [phone]   Fax:  [phone]  Name: Kevin [name] MRN: Yisroel Ramming Date of Birth: 1946/07/30

## 2020-12-28 ENCOUNTER — Ambulatory Visit: Payer: Medicare HMO | Admitting: Physical Therapy

## 2021-01-04 ENCOUNTER — Encounter: Payer: Self-pay | Admitting: Physical Therapy

## 2021-01-04 ENCOUNTER — Other Ambulatory Visit: Payer: Self-pay

## 2021-01-04 ENCOUNTER — Ambulatory Visit: Payer: Medicare HMO | Attending: Internal Medicine | Admitting: Physical Therapy

## 2021-01-04 DIAGNOSIS — M545 Low back pain, unspecified: Secondary | ICD-10-CM | POA: Insufficient documentation

## 2021-01-04 DIAGNOSIS — G8929 Other chronic pain: Secondary | ICD-10-CM | POA: Diagnosis not present

## 2021-01-04 DIAGNOSIS — R2689 Other abnormalities of gait and mobility: Secondary | ICD-10-CM | POA: Insufficient documentation

## 2021-01-04 NOTE — Patient Instructions (Signed)
Access Code: [email or web address] URL: [email or web address] Date: 01/04/2021 Prepared by: Hilda Blades  Exercises Modified Marcello Moores Stretch - 1-2 x daily - 7 x weekly - 2 reps - 60 seconds hold Supine Lower Trunk Rotation - 1-2 x daily - 7 x weekly - 5 reps - 10 seconds hold Supine Piriformis Stretch with Foot on Ground - 1-2 x daily - 7 x weekly - 2 reps - 30 seconds hold Supine Active Straight Leg Raise - 1 x daily - 7 x weekly - 2 sets - 10 reps Clamshell - 1 x daily - 7 x weekly - 2 sets - 10 reps Seated Hamstring Stretch - 1-2 x daily - 7 x weekly - 2 reps - 30 seconds hold Sit to Stand with Armchair - 1-2 x daily - 7 x weekly - 10 reps Standing Tandem Balance with Counter Support - 1 x daily - 7 x weekly - 3 reps - 30 seconds hold Romberg Stance with Head Nods - 1 x daily - 7 x weekly - 2 sets - 30 seconds hold Romberg Stance with Head Rotation - 1 x daily - 7 x weekly - 2 sets - 30 seconds hold

## 2021-01-05 ENCOUNTER — Encounter: Payer: Self-pay | Admitting: Physical Therapy

## 2021-01-05 DIAGNOSIS — G4733 Obstructive sleep apnea (adult) (pediatric): Secondary | ICD-10-CM | POA: Diagnosis not present

## 2021-01-05 NOTE — Therapy (Signed)
[place], [place], [address] Phone: [phone]   Fax:  [phone]  Physical Therapy Treatment  Patient Details  Name: Kevin [name] MRN: [id number] Date of Birth: 11-04-1946 Referring Provider (PT):  Lavone Orn, MD (11/09/2020)   Encounter Date: 01/04/2021   PT End of Session - 01/04/21 1603    Visit Number 3    Number of Visits 7    Date for PT Re-Evaluation 01/12/21    Authorization Type [employer] MCR - FOTO at 6th and 10th visit    Progress Note Due on Visit 10    PT Start Time 1535    PT Stop Time 1615    PT Time Calculation (min) 40 min    Activity Tolerance Patient tolerated treatment well    Behavior During Therapy [facility] for tasks assessed/performed           Past Medical History:  Diagnosis Date  . Diabetes mellitus   . Fall   . FUO (fever of unknown origin) 01/19/2016  . GERD (gastroesophageal reflux disease)   . History of hiatal hernia   . Hypertension   . Occasional tremors   . Prostatitis 01/19/2016  . Sleep apnea    . Last test was before 2007 , not sure name of test site. uses CPAP  . Syncope   . Wears glasses     Past Surgical History:  Procedure Laterality Date  . BACK SURGERY    . CHOLECYSTECTOMY N/A 06/15/2016   Procedure: LAPAROSCOPIC CHOLECYSTECTOMY WITH INTRAOPERATIVE CHOLANGIOGRAM;  Surgeon: Armandina Gemma, MD;  Location: [facility];  Service: General;  Laterality: N/A;  . Colonoscopy  2011  . EYE SURGERY     Cataract 2013  . HIATAL HERNIA REPAIR     x2  . KNEE ARTHROSCOPY     bil  . LUMBAR FUSION  2013  . PATELLA FRACTURE SURGERY      There were no vitals filed for this visit.   Subjective Assessment - 01/04/21 1539    Subjective Patient reports he is doing well, about the same as last visit. Back still bothers him and he feels like his balance has not improved. Bunnie Domino    Patient Stated Goals Determine what exercises could help me reduce pain and get  back to walking    Currently in Pain? Yes    Pain Score 3     Pain Location Back    Pain Orientation Lower    Pain Descriptors / Indicators Aching    Pain Type Chronic pain    Pain Onset More than a month ago    Pain Frequency Constant              [facility] PT Assessment - 01/05/21 0001      High Level Balance   High Level Balance Comments Romberg with EC: patient exhibits significant increased sway and required support                         [facility] Adult PT Treatment/Exercise - 01/05/21 0001      Exercises   Exercises Lumbar      Lumbar Exercises: Stretches   Passive Hamstring Stretch 2 reps;30 seconds    Passive Hamstring Stretch Limitations supine PROM    Single Knee to Chest Stretch 2 reps;30 seconds    Single Knee to Chest Stretch Limitations supine PROM    Lower Trunk Rotation 3 reps;10 seconds    Lower  Trunk Rotation Limitations figure-4 position    Hip Flexor Stretch 2 reps;60 seconds    Hip Flexor Stretch Limitations thomas edge of mat    Piriformis Stretch 2 reps;20 seconds    Piriformis Stretch Limitations supine PROM      Lumbar Exercises: Aerobic   Nustep L5 x 5 min with LE only      Lumbar Exercises: Standing   Other Standing Lumbar Exercises Romberg stance x 20 sec, with EC x 20 sec, with head turns and nods x 20 sec each   requires assist with EC     Lumbar Exercises: Supine   Clam 10 reps;3 seconds    Clam Limitations green band    Bent Knee Raise 10 reps   2 sets   Bent Knee Raise Limitations green band                  PT Education - 01/04/21 1602    Education Details HEP, stretching and balance progression    Person(s) Educated Patient    Methods Explanation;Demonstration;Tactile cues;Verbal cues;Handout    Comprehension Verbalized understanding;Need further instruction;Returned demonstration;Verbal cues required;Tactile cues required            PT Short Term Goals - 12/01/20 1539      PT SHORT TERM GOAL #1    Title Pt will be IND with HEP    Time 3    Period Weeks    Status New    Target Date 12/22/20      PT SHORT TERM GOAL #2   Title Pt will report increased time standing/walking to 5 min prior to pain onset    Time 3    Period Weeks    Status New    Target Date 12/22/20      PT SHORT TERM GOAL #3   Title Pt will be assessed on BERG, 5xSTS, and 10MWT (or other endurance measure), educated on results, and goals updated accordingly    Time 3    Period Weeks    Status New    Target Date 12/22/20             PT Long Term Goals - 12/01/20 1542      PT LONG TERM GOAL #1   Title Pt will be IND with all provided HEP to promote independence and functional mobility    Time 6    Period Weeks    Status New    Target Date 01/12/21      PT LONG TERM GOAL #2   Title Pt's gross lower quadrant strength will be measured >/= 4/5 to promote functional gait and reduce fall risk    Baseline hip and LE strength will be assessed      PT LONG TERM GOAL #3   Title Pt will report decreased pain with standing and walking </= 4/10 with increased time duration of said activity to 10-20 minutes before pain onset    Time 6    Period Weeks    Status New    Target Date 01/12/21      PT LONG TERM GOAL #4   Title Pt will be be able to assume tandem and SLS for at least 10seconds to demonstrate reduced fall risk    Time 6    Period Weeks    Status New    Target Date 01/12/21                 Plan - 01/04/21 1604    Clinical  Impression Statement Patient tolerated therapy well with no adverse effects. He continues to have difficulty with balance and with exercises due to neuropathy and trouble telling where his feet are or positioned. Therapy focused on stretching and mobility for lumbar region and progression of balance training. Patient continues to ambulate with flexed positioning due to back discomfort and requires extra time for transfers. He did exhibit increased anterior chain tightness  this visit so added quad/hip flexor stretch to HEP, and he exhibits difficulty with balance without visual cues to incorporated head movements into balance training. Patient would benefit from continued skilled PT to progress mobility and strength in order to reduce pain, improve balance and gait, and reduce limitations to reduce fall risk and improve quality of life.    PT Treatment/Interventions ADLs/Self Care Home Management;Cryotherapy;DME Instruction;Moist Heat;Gait training;Stair training;Functional mobility training;Therapeutic activities;Therapeutic exercise;Balance training;Neuromuscular re-education;Manual techniques;Patient/family education;Dry needling;Energy conservation;Joint Manipulations;Taping;Aquatic Therapy;Iontophoresis 4mg /ml Dexamethasone    PT Next Visit Plan MMT Hip/Knee/Ankle, palpate and assess spinal mobility, BERG, 10MWT (or 2 minute walk test?), 5xSTS, update and progress HEP with results.    PT Home Exercise Plan [id number] - LTR, piriformis stretch, thomas stretch edge of bed, SLR, side clamshell, seated hamstring stretch, tandem stance at counter, romberg stance with head rotations/nods, STS with UE    Consulted and Agree with Plan of Care Patient           Patient will benefit from skilled therapeutic intervention in order to improve the following deficits and impairments:  Abnormal gait,Decreased activity tolerance,Decreased balance,Decreased mobility,Decreased endurance,Decreased range of motion,Decreased strength,Hypomobility,Difficulty walking,Pain  Visit Diagnosis: Other abnormalities of gait and mobility  Chronic right-sided low back pain, unspecified whether sciatica present     Problem List Patient Active Problem List   Diagnosis Date Noted  . New onset atrial fibrillation ([place]) 02/28/2020  . Sepsis due to undetermined organism ([place]) 02/26/2020  . Acute metabolic encephalopathy 0000000  . Chronic pain 02/26/2020  . Cholelithiasis with chronic  cholecystitis 06/15/2016  . Cholelithiasis with cholecystitis 06/14/2016  . FUO (fever of unknown origin) 01/19/2016  . Prostatitis 01/19/2016  . Pseudomonas aeruginosa infection 01/21/2015  . Vertebral osteomyelitis ([place]) 04/24/2013  . Diskitis 01/23/2013  . Swelling of arm 01/23/2013  . Encephalopathy acute 12/28/2012  . UTI (lower urinary tract infection) 12/28/2012  . Spondylolisthesis of lumbar region 08/30/2012  . COUGH VARIANT ASTHMA 10/09/2008  . Essential hypertension 09/12/2008  . Sleep apnea 09/12/2008  . COUGH 09/12/2008  . ALLERGIC RHINITIS 09/04/2008  . Uncontrolled type 2 diabetes mellitus with hyperglycemia, with long-term current use of insulin ([place]) 09/03/2008  . Dyslipidemia 09/03/2008  . Class 1 obesity with body mass index (BMI) of 33.0 to 33.9 in adult 09/03/2008  . 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 ARTHRITIS 09/03/2008    Hilda Blades, PT, DPT, LAT, ATC 01/05/21  8:39 AM Phone: [phone] Fax: [facility] [address] [place], [place], [address] Phone: [phone]   Fax:  [phone]  Name: Kevin [name] MRN: [id number] Date of Birth: 06-Jan-1946

## 2021-01-07 DIAGNOSIS — Z01812 Encounter for preprocedural laboratory examination: Secondary | ICD-10-CM | POA: Diagnosis not present

## 2021-01-11 ENCOUNTER — Ambulatory Visit: Payer: Medicare HMO | Admitting: Physical Therapy

## 2021-01-12 DIAGNOSIS — D122 Benign neoplasm of ascending colon: Secondary | ICD-10-CM | POA: Diagnosis not present

## 2021-01-12 DIAGNOSIS — D123 Benign neoplasm of transverse colon: Secondary | ICD-10-CM | POA: Diagnosis not present

## 2021-01-12 DIAGNOSIS — Z1211 Encounter for screening for malignant neoplasm of colon: Secondary | ICD-10-CM | POA: Diagnosis not present

## 2021-01-12 DIAGNOSIS — K648 Other hemorrhoids: Secondary | ICD-10-CM | POA: Diagnosis not present

## 2021-01-12 DIAGNOSIS — D120 Benign neoplasm of cecum: Secondary | ICD-10-CM | POA: Diagnosis not present

## 2021-01-12 DIAGNOSIS — D124 Benign neoplasm of descending colon: Secondary | ICD-10-CM | POA: Diagnosis not present

## 2021-01-12 DIAGNOSIS — K5730 Diverticulosis of large intestine without perforation or abscess without bleeding: Secondary | ICD-10-CM | POA: Diagnosis not present

## 2021-01-18 ENCOUNTER — Encounter: Payer: Self-pay | Admitting: Physical Therapy

## 2021-01-18 ENCOUNTER — Other Ambulatory Visit: Payer: Self-pay

## 2021-01-18 ENCOUNTER — Ambulatory Visit: Payer: Medicare HMO | Admitting: Physical Therapy

## 2021-01-18 DIAGNOSIS — M545 Low back pain, unspecified: Secondary | ICD-10-CM

## 2021-01-18 DIAGNOSIS — R2689 Other abnormalities of gait and mobility: Secondary | ICD-10-CM

## 2021-01-18 DIAGNOSIS — D123 Benign neoplasm of transverse colon: Secondary | ICD-10-CM | POA: Diagnosis not present

## 2021-01-18 DIAGNOSIS — G8929 Other chronic pain: Secondary | ICD-10-CM

## 2021-01-18 DIAGNOSIS — D122 Benign neoplasm of ascending colon: Secondary | ICD-10-CM | POA: Diagnosis not present

## 2021-01-18 DIAGNOSIS — D124 Benign neoplasm of descending colon: Secondary | ICD-10-CM | POA: Diagnosis not present

## 2021-01-18 DIAGNOSIS — D120 Benign neoplasm of cecum: Secondary | ICD-10-CM | POA: Diagnosis not present

## 2021-01-18 NOTE — Therapy (Signed)
[place], [place], [address] Phone: [phone]   Fax:  [phone]  Physical Therapy Treatment / Re-certification  Patient Details  Name: Kevin [name] MRN: [id number] Date of Birth: December 03, 1946 Referring Provider (PT):  Lavone Orn, MD (11/09/2020)   Encounter Date: 01/18/2021   PT End of Session - 01/18/21 1505    Visit Number 4    Number of Visits 8    Date for PT Re-Evaluation 03/15/21    Authorization Type [employer] MCR - FOTO at 6th and 10th visit    Progress Note Due on Visit 10    PT Start Time 1505    PT Stop Time 1544    PT Time Calculation (min) 39 min    Activity Tolerance Patient tolerated treatment well           Past Medical History:  Diagnosis Date  . Diabetes mellitus   . Fall   . FUO (fever of unknown origin) 01/19/2016  . GERD (gastroesophageal reflux disease)   . History of hiatal hernia   . Hypertension   . Occasional tremors   . Prostatitis 01/19/2016  . Sleep apnea    . Last test was before 2007 , not sure name of test site. uses CPAP  . Syncope   . Wears glasses     Past Surgical History:  Procedure Laterality Date  . BACK SURGERY    . CHOLECYSTECTOMY N/A 06/15/2016   Procedure: LAPAROSCOPIC CHOLECYSTECTOMY WITH INTRAOPERATIVE CHOLANGIOGRAM;  Surgeon: Armandina Gemma, MD;  Location: [facility];  Service: General;  Laterality: N/A;  . Colonoscopy  2011  . EYE SURGERY     Cataract 2013  . HIATAL HERNIA REPAIR     x2  . KNEE ARTHROSCOPY     bil  . LUMBAR FUSION  2013  . PATELLA FRACTURE SURGERY      There were no vitals filed for this visit.   Subjective Assessment - 01/18/21 1508    Subjective "I had a interruption last week due to a colonoscopy, which I didn't recover very well. as far as the exercise that was given I've tried most of them not all of them. I do other things but I grade myself as C regarding my exercises"    Patient Stated Goals Determine what  exercises could help me reduce pain and get back to walking    Currently in Pain? Yes    Pain Score 3     Pain Location Back    Pain Orientation Lower    Pain Descriptors / Indicators Aching;Sore    Pain Type Chronic pain    Pain Onset More than a month ago    Pain Frequency Intermittent    Aggravating Factors  standing/ walking and moving around    Pain Relieving Factors sitting and resting,              [facility] PT Assessment - 01/18/21 0001      Assessment   Medical Diagnosis Unspecified abnormalities of gait and mobility R26.9    Referring Provider (PT)  Lavone Orn, MD (11/09/2020)      Strength   Right/Left Hip Right;Left    Right Hip Flexion 4+/5    Right Hip Extension 4/5   tested standing bend over table   Right Hip ABduction 4+/5    Right Hip ADduction 4/5    Left Hip Flexion 4+/5    Left Hip Extension 4/5    Left Hip ABduction 4+/5  Left Hip ADduction 4/5    Right/Left Knee Right;Left    Right Knee Flexion 5/5    Right Knee Extension 5/5    Left Knee Flexion 5/5    Left Knee Extension 5/5                         [facility] Adult PT Treatment/Exercise - 01/18/21 0001      Lumbar Exercises: Stretches   Hip Flexor Stretch 3 reps;30 seconds;Left;Right   PNF contract/ relax with 10 secon contraction   Hip Flexor Stretch Limitations standing hip flexor 2 x 30 with isometric glute squeeze      Lumbar Exercises: Supine   Bridge 10 reps;5 seconds   with glute set                 PT Education - 01/18/21 1534    Education Details reviewed HEP, and updated POC    Person(s) Educated Patient    Methods Explanation;Verbal cues    Comprehension Verbalized understanding;Verbal cues required            PT Short Term Goals - 01/18/21 1511      PT SHORT TERM GOAL #1   Title Pt will be IND with HEP    Period Weeks    Status Partially Met      PT SHORT TERM GOAL #2   Title Pt will report increased time standing/walking to 5 min prior to  pain onset    Period Weeks    Status On-going      PT SHORT TERM GOAL #3   Title Pt will be assessed on BERG, 5xSTS, and (or other endurance measure), educated on results, and goals updated accordingly    Period Weeks    Status On-going             PT Long Term Goals - 01/18/21 1513      PT LONG TERM GOAL #1   Title Pt will be IND with all provided HEP to promote independence and functional mobility    Time 6    Period Weeks    Status On-going    Target Date 03/01/21      PT LONG TERM GOAL #2   Title Pt's gross lower quadrant strength will be >/= 4/5 to promote functional gait and reduce fall risk    Baseline hip and LE strength will be assessed    Time 6    Period Weeks    Status On-going    Target Date 03/01/21      PT LONG TERM GOAL #3   Title Pt will report decreased pain with standing and walking </= 4/10 with increased time duration of said activity to 10-20 minutes before pain onset    Time 6    Period Weeks    Status On-going    Target Date 03/01/21      PT LONG TERM GOAL #4   Title Pt will be be able to assume tandem and SLS for at least 10seconds to demonstrate reduced fall risk    Time 6    Period Weeks    Status On-going    Target Date 03/01/21                 Plan - 01/18/21 1546    Clinical Impression Statement pt reports limited improvement since starting PT but also reports limited consistency with his HEP. REviewed HEp  and benefits of hip flexor stretching followed with  glute strengtheing to promote posture, and balance to center his weight between his feet. He has made limited progress toward his goals but would benefit from continued physical therapy to low back pain, reduce hip stiffness, promote gross hip strength and endurance and maximize his function by addressing the deficits listed.    PT Frequency Biweekly    PT Duration 8 weeks    PT Treatment/Interventions ADLs/Self Care Home Management;Cryotherapy;DME Instruction;Moist  Heat;Gait training;Stair training;Functional mobility training;Therapeutic activities;Therapeutic exercise;Balance training;Neuromuscular re-education;Manual techniques;Patient/family education;Dry needling;Energy conservation;Joint Manipulations;Taping;Aquatic Therapy;Iontophoresis 4mg /ml Dexamethasone    PT Next Visit Plan update and progress HEP with results.  gross hipo flexor stretching/ STW, glute strengthening endurnace training.    PT Home Exercise Plan [id number] - LTR, piriformis stretch, thomas stretch edge of bed, SLR, side clamshell, seated hamstring stretch, tandem stance at counter, romberg stance with head rotations/nods, STS with UE, standing hip flexor stretching and bridge with glute set           Patient will benefit from skilled therapeutic intervention in order to improve the following deficits and impairments:  Abnormal gait,Decreased activity tolerance,Decreased balance,Decreased mobility,Decreased endurance,Decreased range of motion,Decreased strength,Hypomobility,Difficulty walking,Pain  Visit Diagnosis: Other abnormalities of gait and mobility  Chronic right-sided low back pain, unspecified whether sciatica present     Problem List Patient Active Problem List   Diagnosis Date Noted  . New onset atrial fibrillation ([place]) 02/28/2020  . Sepsis due to undetermined organism ([place]) 02/26/2020  . Acute metabolic encephalopathy 10/62/6948  . Chronic pain 02/26/2020  . Cholelithiasis with chronic cholecystitis 06/15/2016  . Cholelithiasis with cholecystitis 06/14/2016  . FUO (fever of unknown origin) 01/19/2016  . Prostatitis 01/19/2016  . Pseudomonas aeruginosa infection 01/21/2015  . Vertebral osteomyelitis ([place]) 04/24/2013  . Diskitis 01/23/2013  . Swelling of arm 01/23/2013  . Encephalopathy acute 12/28/2012  . UTI (lower urinary tract infection) 12/28/2012  . Spondylolisthesis of lumbar region 08/30/2012  . COUGH VARIANT ASTHMA 10/09/2008  . Essential  hypertension 09/12/2008  . Sleep apnea 09/12/2008  . COUGH 09/12/2008  . ALLERGIC RHINITIS 09/04/2008  . Uncontrolled type 2 diabetes mellitus with hyperglycemia, with long-term current use of insulin ([place]) 09/03/2008  . Dyslipidemia 09/03/2008  . Class 1 obesity with body mass index (BMI) of 33.0 to 33.9 in adult 09/03/2008  . 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 ARTHRITIS 09/03/2008    Starr Lake PT, DPT, LAT, ATC  01/18/21  3:52 PM      [facility] [address] [place], [place], [address] Phone: [phone]   Fax:  [phone]  Name: Kevin [name] MRN: [id number] Date of Birth: 06-18-1946

## 2021-01-24 DIAGNOSIS — R3989 Other symptoms and signs involving the genitourinary system: Secondary | ICD-10-CM | POA: Diagnosis not present

## 2021-01-24 DIAGNOSIS — E1142 Type 2 diabetes mellitus with diabetic polyneuropathy: Secondary | ICD-10-CM | POA: Diagnosis not present

## 2021-01-24 DIAGNOSIS — Z794 Long term (current) use of insulin: Secondary | ICD-10-CM | POA: Diagnosis not present

## 2021-01-31 DIAGNOSIS — N182 Chronic kidney disease, stage 2 (mild): Secondary | ICD-10-CM | POA: Diagnosis not present

## 2021-01-31 DIAGNOSIS — E1142 Type 2 diabetes mellitus with diabetic polyneuropathy: Secondary | ICD-10-CM | POA: Diagnosis not present

## 2021-01-31 DIAGNOSIS — E1165 Type 2 diabetes mellitus with hyperglycemia: Secondary | ICD-10-CM | POA: Diagnosis not present

## 2021-01-31 DIAGNOSIS — K219 Gastro-esophageal reflux disease without esophagitis: Secondary | ICD-10-CM | POA: Diagnosis not present

## 2021-01-31 DIAGNOSIS — I480 Paroxysmal atrial fibrillation: Secondary | ICD-10-CM | POA: Diagnosis not present

## 2021-01-31 DIAGNOSIS — I10 Essential (primary) hypertension: Secondary | ICD-10-CM | POA: Diagnosis not present

## 2021-01-31 DIAGNOSIS — E1122 Type 2 diabetes mellitus with diabetic chronic kidney disease: Secondary | ICD-10-CM | POA: Diagnosis not present

## 2021-01-31 DIAGNOSIS — N401 Enlarged prostate with lower urinary tract symptoms: Secondary | ICD-10-CM | POA: Diagnosis not present

## 2021-01-31 DIAGNOSIS — N400 Enlarged prostate without lower urinary tract symptoms: Secondary | ICD-10-CM | POA: Diagnosis not present

## 2021-01-31 DIAGNOSIS — I2510 Atherosclerotic heart disease of native coronary artery without angina pectoris: Secondary | ICD-10-CM | POA: Diagnosis not present

## 2021-02-02 ENCOUNTER — Other Ambulatory Visit: Payer: Self-pay

## 2021-02-02 ENCOUNTER — Encounter: Payer: Self-pay | Admitting: Physical Therapy

## 2021-02-02 ENCOUNTER — Ambulatory Visit: Payer: Medicare HMO | Attending: Internal Medicine | Admitting: Physical Therapy

## 2021-02-02 DIAGNOSIS — R2689 Other abnormalities of gait and mobility: Secondary | ICD-10-CM | POA: Diagnosis not present

## 2021-02-02 DIAGNOSIS — M545 Low back pain, unspecified: Secondary | ICD-10-CM | POA: Insufficient documentation

## 2021-02-02 DIAGNOSIS — G8929 Other chronic pain: Secondary | ICD-10-CM | POA: Diagnosis not present

## 2021-02-02 NOTE — Therapy (Addendum)
[place], [place], [address] Phone: [phone]   Fax:  [phone]  Physical Therapy Treatment / Discharge  Patient Details  Name: Kevin [name] MRN: [id number] Date of Birth: 07-Dec-1946 Referring Provider (PT):  Lavone Orn, MD (11/09/2020)   Encounter Date: 02/02/2021   PT End of Session - 02/02/21 1458    Visit Number 5    Number of Visits 8    Date for PT Re-Evaluation 03/15/21    Authorization Type [employer] MCR - FOTO at 6th and 10th visit    Progress Note Due on Visit 10    PT Start Time 1448    PT Stop Time 1530    PT Time Calculation (min) 42 min    Activity Tolerance Patient tolerated treatment well    Behavior During Therapy [facility] for tasks assessed/performed           Past Medical History:  Diagnosis Date  . Diabetes mellitus   . Fall   . FUO (fever of unknown origin) 01/19/2016  . GERD (gastroesophageal reflux disease)   . History of hiatal hernia   . Hypertension   . Occasional tremors   . Prostatitis 01/19/2016  . Sleep apnea    . Last test was before 2007 , not sure name of test site. uses CPAP  . Syncope   . Wears glasses     Past Surgical History:  Procedure Laterality Date  . BACK SURGERY    . CHOLECYSTECTOMY N/A 06/15/2016   Procedure: LAPAROSCOPIC CHOLECYSTECTOMY WITH INTRAOPERATIVE CHOLANGIOGRAM;  Surgeon: Armandina Gemma, MD;  Location: [facility];  Service: General;  Laterality: N/A;  . Colonoscopy  2011  . EYE SURGERY     Cataract 2013  . HIATAL HERNIA REPAIR     x2  . KNEE ARTHROSCOPY     bil  . LUMBAR FUSION  2013  . PATELLA FRACTURE SURGERY      There were no vitals filed for this visit.   Subjective Assessment - 02/02/21 1453    Subjective Patient reports he was sore after last visit. He has not been doing his exercises as much as expected.    Patient Stated Goals Determine what exercises could help me reduce pain and get back to walking    Currently in Pain?  Yes    Pain Score 3     Pain Location Back    Pain Orientation Lower    Pain Descriptors / Indicators Aching;Sore    Pain Type Chronic pain    Pain Onset More than a month ago    Pain Frequency Intermittent                             [facility] Adult PT Treatment/Exercise - 02/02/21 0001      Exercises   Exercises Lumbar      Lumbar Exercises: Stretches   Passive Hamstring Stretch 2 reps;30 seconds    Passive Hamstring Stretch Limitations supine PROM    Single Knee to Chest Stretch 2 reps;30 seconds    Single Knee to Chest Stretch Limitations supine PROM    Lower Trunk Rotation 5 reps;10 seconds    Hip Flexor Stretch 3 reps;30 seconds    Hip Flexor Stretch Limitations thomas PROM edge of table    Piriformis Stretch 2 reps;30 seconds    Piriformis Stretch Limitations supine PROM      Lumbar Exercises: Aerobic   Nustep L5 x 5  min with UE/LE      Lumbar Exercises: Seated   Sit to Stand 5 reps    Sit to Stand Limitations UE assist to rise, then no UE support lowering      Lumbar Exercises: Supine   Bridge 5 reps;5 seconds   2 sets                 PT Education - 02/02/21 1456    Education Details HEP    Person(s) Educated Patient    Methods Explanation    Comprehension Verbalized understanding;Need further instruction            PT Short Term Goals - 01/18/21 1511      PT SHORT TERM GOAL #1   Title Pt will be IND with HEP    Period Weeks    Status Partially Met      PT SHORT TERM GOAL #2   Title Pt will report increased time standing/walking to 5 min prior to pain onset    Period Weeks    Status On-going      PT SHORT TERM GOAL #3   Title Pt will be assessed on BERG, 5xSTS, and 10MWT (or other endurance measure), educated on results, and goals updated accordingly    Period Weeks    Status On-going             PT Long Term Goals - 01/18/21 1513      PT LONG TERM GOAL #1   Title Pt will be IND with all provided HEP to promote  independence and functional mobility    Time 6    Period Weeks    Status On-going    Target Date 03/01/21      PT LONG TERM GOAL #2   Title Pt's gross lower quadrant strength will be >/= 4/5 to promote functional gait and reduce fall risk    Baseline hip and LE strength will be assessed    Time 6    Period Weeks    Status On-going    Target Date 03/01/21      PT LONG TERM GOAL #3   Title Pt will report decreased pain with standing and walking </= 4/10 with increased time duration of said activity to 10-20 minutes before pain onset    Time 6    Period Weeks    Status On-going    Target Date 03/01/21      PT LONG TERM GOAL #4   Title Pt will be be able to assume tandem and SLS for at least 10seconds to demonstrate reduced fall risk    Time 6    Period Weeks    Status On-going    Target Date 03/01/21                 Plan - 02/02/21 1509    Clinical Impression Statement Patient tolerated therapy well with no adverse effects.Therapy focused on hip/lumbar mobility and gluteal strengthening. Patient continues to ambulate with a flexed posturing but is able to achieve a upright posture when cued while performing sit<>stand exercises. Patient reports feeling better following stretching but will have prolonged soreness following therapy. Patient encouraged to work on consistency with exercises and posture. No change to current HEP. Patient would benefit from continued skilled PT to progress mobility and strength to reduce pain and maximize mobility.    PT Treatment/Interventions ADLs/Self Care Home Management;Cryotherapy;DME Instruction;Moist Heat;Gait training;Stair training;Functional mobility training;Therapeutic activities;Therapeutic exercise;Balance training;Neuromuscular re-education;Manual techniques;Patient/family education;Dry needling;Energy conservation;Joint Manipulations;Taping;Aquatic Therapy;Iontophoresis  68m/ml Dexamethasone    PT Next Visit Plan update and progress HEP  PRN.  gross hip flexor stretching/ STW, glute strengthening endurnace training. posture    PT Home Exercise Plan [id number] - LTR, piriformis stretch, thomas stretch edge of bed, SLR, side clamshell, seated hamstring stretch, tandem stance at counter, romberg stance with head rotations/nods, STS with UE, standing hip flexor stretching and bridge with glute set    Consulted and Agree with Plan of Care Patient           Patient will benefit from skilled therapeutic intervention in order to improve the following deficits and impairments:  Abnormal gait,Decreased activity tolerance,Decreased balance,Decreased mobility,Decreased endurance,Decreased range of motion,Decreased strength,Hypomobility,Difficulty walking,Pain  Visit Diagnosis: Other abnormalities of gait and mobility  Chronic right-sided low back pain, unspecified whether sciatica present     Problem List Patient Active Problem List   Diagnosis Date Noted  . New onset atrial fibrillation ([place] 02/28/2020  . Sepsis due to undetermined organism ([place] 02/26/2020  . Acute metabolic encephalopathy 038/87/1959 . Chronic pain 02/26/2020  . Cholelithiasis with chronic cholecystitis 06/15/2016  . Cholelithiasis with cholecystitis 06/14/2016  . FUO (fever of unknown origin) 01/19/2016  . Prostatitis 01/19/2016  . Pseudomonas aeruginosa infection 01/21/2015  . Vertebral osteomyelitis ([place] 04/24/2013  . Diskitis 01/23/2013  . Swelling of arm 01/23/2013  . Encephalopathy acute 12/28/2012  . UTI (lower urinary tract infection) 12/28/2012  . Spondylolisthesis of lumbar region 08/30/2012  . COUGH VARIANT ASTHMA 10/09/2008  . Essential hypertension 09/12/2008  . Sleep apnea 09/12/2008  . COUGH 09/12/2008  . ALLERGIC RHINITIS 09/04/2008  . Uncontrolled type 2 diabetes mellitus with hyperglycemia, with long-term current use of insulin ([place] 09/03/2008  . Dyslipidemia 09/03/2008  . Class 1 obesity with body mass index (BMI) of 33.0 to 33.9  in adult 09/03/2008  . 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 ARTHRITIS 09/03/2008    CHilda Blades PT, DPT, LAT, ATC 02/02/21  4:23 PM Phone: [phone]: [address] [address]Phone: [phone]  Fax:  [phone] Name: VHASSEN BRUUNMRN: [id number] of Birth: 1April 21, 1947    PHYSICAL THERAPY DISCHARGE SUMMARY  Visits from Start of Care: 5  Current functional level related to goals / functional outcomes: See goals   Remaining deficits: Current status unknown   Education / Equipment: HEP  Plan: Patient agrees to discharge.  Patient goals were not met. Patient is being discharged due to not returning since the last visit.  ?????         Kristoffer Leamon PT, DPT, LAT, ATC  04/02/21  12:01 PM

## 2021-02-05 DIAGNOSIS — G4733 Obstructive sleep apnea (adult) (pediatric): Secondary | ICD-10-CM | POA: Diagnosis not present

## 2021-02-15 ENCOUNTER — Ambulatory Visit: Payer: Medicare HMO | Admitting: Physical Therapy

## 2021-02-15 DIAGNOSIS — M961 Postlaminectomy syndrome, not elsewhere classified: Secondary | ICD-10-CM | POA: Diagnosis not present

## 2021-02-15 DIAGNOSIS — G894 Chronic pain syndrome: Secondary | ICD-10-CM | POA: Diagnosis not present

## 2021-03-01 ENCOUNTER — Encounter: Payer: Medicare HMO | Admitting: Physical Therapy

## 2021-03-05 DIAGNOSIS — G4733 Obstructive sleep apnea (adult) (pediatric): Secondary | ICD-10-CM | POA: Diagnosis not present

## 2021-03-15 ENCOUNTER — Ambulatory Visit: Payer: Medicare HMO | Admitting: Physical Therapy

## 2021-03-24 DIAGNOSIS — E1165 Type 2 diabetes mellitus with hyperglycemia: Secondary | ICD-10-CM | POA: Diagnosis not present

## 2021-03-24 DIAGNOSIS — N401 Enlarged prostate with lower urinary tract symptoms: Secondary | ICD-10-CM | POA: Diagnosis not present

## 2021-03-24 DIAGNOSIS — K219 Gastro-esophageal reflux disease without esophagitis: Secondary | ICD-10-CM | POA: Diagnosis not present

## 2021-03-24 DIAGNOSIS — N182 Chronic kidney disease, stage 2 (mild): Secondary | ICD-10-CM | POA: Diagnosis not present

## 2021-03-24 DIAGNOSIS — I480 Paroxysmal atrial fibrillation: Secondary | ICD-10-CM | POA: Diagnosis not present

## 2021-03-24 DIAGNOSIS — E1142 Type 2 diabetes mellitus with diabetic polyneuropathy: Secondary | ICD-10-CM | POA: Diagnosis not present

## 2021-03-24 DIAGNOSIS — E1122 Type 2 diabetes mellitus with diabetic chronic kidney disease: Secondary | ICD-10-CM | POA: Diagnosis not present

## 2021-03-24 DIAGNOSIS — I10 Essential (primary) hypertension: Secondary | ICD-10-CM | POA: Diagnosis not present

## 2021-03-24 DIAGNOSIS — N400 Enlarged prostate without lower urinary tract symptoms: Secondary | ICD-10-CM | POA: Diagnosis not present

## 2021-03-24 DIAGNOSIS — I2510 Atherosclerotic heart disease of native coronary artery without angina pectoris: Secondary | ICD-10-CM | POA: Diagnosis not present

## 2021-03-25 DIAGNOSIS — R82998 Other abnormal findings in urine: Secondary | ICD-10-CM | POA: Diagnosis not present

## 2021-04-01 DIAGNOSIS — R319 Hematuria, unspecified: Secondary | ICD-10-CM | POA: Diagnosis not present

## 2021-04-05 DIAGNOSIS — R310 Gross hematuria: Secondary | ICD-10-CM | POA: Diagnosis not present

## 2021-04-05 DIAGNOSIS — G4733 Obstructive sleep apnea (adult) (pediatric): Secondary | ICD-10-CM | POA: Diagnosis not present

## 2021-04-05 DIAGNOSIS — R311 Benign essential microscopic hematuria: Secondary | ICD-10-CM | POA: Diagnosis not present

## 2021-04-19 DIAGNOSIS — R310 Gross hematuria: Secondary | ICD-10-CM | POA: Diagnosis not present

## 2021-04-19 DIAGNOSIS — N200 Calculus of kidney: Secondary | ICD-10-CM | POA: Diagnosis not present

## 2021-04-19 DIAGNOSIS — E1142 Type 2 diabetes mellitus with diabetic polyneuropathy: Secondary | ICD-10-CM | POA: Diagnosis not present

## 2021-04-19 DIAGNOSIS — Z794 Long term (current) use of insulin: Secondary | ICD-10-CM | POA: Diagnosis not present

## 2021-04-19 DIAGNOSIS — E1165 Type 2 diabetes mellitus with hyperglycemia: Secondary | ICD-10-CM | POA: Diagnosis not present

## 2021-04-28 DIAGNOSIS — R310 Gross hematuria: Secondary | ICD-10-CM | POA: Diagnosis not present

## 2021-04-28 DIAGNOSIS — N200 Calculus of kidney: Secondary | ICD-10-CM | POA: Diagnosis not present

## 2021-05-02 ENCOUNTER — Other Ambulatory Visit: Payer: Self-pay | Admitting: Cardiology

## 2021-05-05 DIAGNOSIS — G4733 Obstructive sleep apnea (adult) (pediatric): Secondary | ICD-10-CM | POA: Diagnosis not present

## 2021-05-10 DIAGNOSIS — G4733 Obstructive sleep apnea (adult) (pediatric): Secondary | ICD-10-CM | POA: Diagnosis not present

## 2021-05-10 DIAGNOSIS — R300 Dysuria: Secondary | ICD-10-CM | POA: Diagnosis not present

## 2021-05-10 DIAGNOSIS — I10 Essential (primary) hypertension: Secondary | ICD-10-CM | POA: Diagnosis not present

## 2021-05-10 DIAGNOSIS — K219 Gastro-esophageal reflux disease without esophagitis: Secondary | ICD-10-CM | POA: Diagnosis not present

## 2021-05-16 DIAGNOSIS — M961 Postlaminectomy syndrome, not elsewhere classified: Secondary | ICD-10-CM | POA: Diagnosis not present

## 2021-05-16 DIAGNOSIS — G894 Chronic pain syndrome: Secondary | ICD-10-CM | POA: Diagnosis not present

## 2021-05-19 ENCOUNTER — Telehealth: Payer: Self-pay | Admitting: Cardiology

## 2021-05-19 MED ORDER — METOPROLOL TARTRATE 25 MG PO TABS: 25.0000 mg | ORAL_TABLET | Freq: Two times a day (BID) | ORAL | 0 refills | Status: DC

## 2021-05-19 NOTE — Telephone Encounter (Signed)
*  STAT* If patient is at the pharmacy, call can be transferred to refill team.   1. Which medications need to be refilled? (please list name of each medication and dose if known)  metoprolol tartrate (LOPRESSOR) 25 MG tablet    2. Which pharmacy/location (including street and city if local pharmacy) is medication to be sent to?  [facility] [place] [address] [facility] Portal to Registered [employer] Sites  3. Do they need a 30 day or 90 day supply? [place]

## 2021-05-19 NOTE — Telephone Encounter (Signed)
Called patient left message on personal voice mail Metoprolol refill sent to pharmacy.Advised he is due for follow up visit with Dr.Crenshaw.Advised to call back to schedule appointment.

## 2021-05-30 ENCOUNTER — Other Ambulatory Visit: Payer: Self-pay | Admitting: Cardiology

## 2021-05-30 NOTE — Telephone Encounter (Signed)
6m, 111.8kg, scr 1.02 10/06/20, lovw/crenshaw 10/06/20, ccr 99

## 2021-06-05 DIAGNOSIS — G4733 Obstructive sleep apnea (adult) (pediatric): Secondary | ICD-10-CM | POA: Diagnosis not present

## 2021-06-13 DIAGNOSIS — H6121 Impacted cerumen, right ear: Secondary | ICD-10-CM | POA: Diagnosis not present

## 2021-06-23 DIAGNOSIS — E1165 Type 2 diabetes mellitus with hyperglycemia: Secondary | ICD-10-CM | POA: Diagnosis not present

## 2021-06-23 DIAGNOSIS — K219 Gastro-esophageal reflux disease without esophagitis: Secondary | ICD-10-CM | POA: Diagnosis not present

## 2021-06-23 DIAGNOSIS — N182 Chronic kidney disease, stage 2 (mild): Secondary | ICD-10-CM | POA: Diagnosis not present

## 2021-06-23 DIAGNOSIS — I2510 Atherosclerotic heart disease of native coronary artery without angina pectoris: Secondary | ICD-10-CM | POA: Diagnosis not present

## 2021-06-23 DIAGNOSIS — I10 Essential (primary) hypertension: Secondary | ICD-10-CM | POA: Diagnosis not present

## 2021-06-23 DIAGNOSIS — I480 Paroxysmal atrial fibrillation: Secondary | ICD-10-CM | POA: Diagnosis not present

## 2021-06-23 DIAGNOSIS — E1142 Type 2 diabetes mellitus with diabetic polyneuropathy: Secondary | ICD-10-CM | POA: Diagnosis not present

## 2021-06-23 DIAGNOSIS — E1122 Type 2 diabetes mellitus with diabetic chronic kidney disease: Secondary | ICD-10-CM | POA: Diagnosis not present

## 2021-06-23 DIAGNOSIS — N401 Enlarged prostate with lower urinary tract symptoms: Secondary | ICD-10-CM | POA: Diagnosis not present

## 2021-06-30 DIAGNOSIS — M5442 Lumbago with sciatica, left side: Secondary | ICD-10-CM | POA: Diagnosis not present

## 2021-07-05 DIAGNOSIS — G4733 Obstructive sleep apnea (adult) (pediatric): Secondary | ICD-10-CM | POA: Diagnosis not present

## 2021-07-07 DIAGNOSIS — E1165 Type 2 diabetes mellitus with hyperglycemia: Secondary | ICD-10-CM | POA: Diagnosis not present

## 2021-07-07 DIAGNOSIS — N182 Chronic kidney disease, stage 2 (mild): Secondary | ICD-10-CM | POA: Diagnosis not present

## 2021-07-07 DIAGNOSIS — I480 Paroxysmal atrial fibrillation: Secondary | ICD-10-CM | POA: Diagnosis not present

## 2021-07-07 DIAGNOSIS — N401 Enlarged prostate with lower urinary tract symptoms: Secondary | ICD-10-CM | POA: Diagnosis not present

## 2021-07-07 DIAGNOSIS — E1142 Type 2 diabetes mellitus with diabetic polyneuropathy: Secondary | ICD-10-CM | POA: Diagnosis not present

## 2021-07-07 DIAGNOSIS — I10 Essential (primary) hypertension: Secondary | ICD-10-CM | POA: Diagnosis not present

## 2021-07-07 DIAGNOSIS — N400 Enlarged prostate without lower urinary tract symptoms: Secondary | ICD-10-CM | POA: Diagnosis not present

## 2021-07-07 DIAGNOSIS — I2510 Atherosclerotic heart disease of native coronary artery without angina pectoris: Secondary | ICD-10-CM | POA: Diagnosis not present

## 2021-07-07 DIAGNOSIS — K219 Gastro-esophageal reflux disease without esophagitis: Secondary | ICD-10-CM | POA: Diagnosis not present

## 2021-07-07 DIAGNOSIS — E1122 Type 2 diabetes mellitus with diabetic chronic kidney disease: Secondary | ICD-10-CM | POA: Diagnosis not present

## 2021-08-05 DIAGNOSIS — G4733 Obstructive sleep apnea (adult) (pediatric): Secondary | ICD-10-CM | POA: Diagnosis not present

## 2021-08-10 DIAGNOSIS — H02834 Dermatochalasis of left upper eyelid: Secondary | ICD-10-CM | POA: Diagnosis not present

## 2021-08-10 DIAGNOSIS — H02831 Dermatochalasis of right upper eyelid: Secondary | ICD-10-CM | POA: Diagnosis not present

## 2021-08-10 DIAGNOSIS — Z961 Presence of intraocular lens: Secondary | ICD-10-CM | POA: Diagnosis not present

## 2021-08-10 DIAGNOSIS — G4730 Sleep apnea, unspecified: Secondary | ICD-10-CM | POA: Diagnosis not present

## 2021-08-10 DIAGNOSIS — D492 Neoplasm of unspecified behavior of bone, soft tissue, and skin: Secondary | ICD-10-CM | POA: Diagnosis not present

## 2021-08-10 DIAGNOSIS — H26491 Other secondary cataract, right eye: Secondary | ICD-10-CM | POA: Diagnosis not present

## 2021-08-10 DIAGNOSIS — H04123 Dry eye syndrome of bilateral lacrimal glands: Secondary | ICD-10-CM | POA: Diagnosis not present

## 2021-08-10 DIAGNOSIS — E119 Type 2 diabetes mellitus without complications: Secondary | ICD-10-CM | POA: Diagnosis not present

## 2021-08-17 DIAGNOSIS — M961 Postlaminectomy syndrome, not elsewhere classified: Secondary | ICD-10-CM | POA: Diagnosis not present

## 2021-08-17 DIAGNOSIS — G894 Chronic pain syndrome: Secondary | ICD-10-CM | POA: Diagnosis not present

## 2021-08-19 DIAGNOSIS — R319 Hematuria, unspecified: Secondary | ICD-10-CM | POA: Diagnosis not present

## 2021-09-05 DIAGNOSIS — G4733 Obstructive sleep apnea (adult) (pediatric): Secondary | ICD-10-CM | POA: Diagnosis not present

## 2021-09-13 ENCOUNTER — Encounter: Payer: Self-pay | Admitting: Physician Assistant

## 2021-09-13 ENCOUNTER — Ambulatory Visit: Payer: Medicare HMO | Admitting: Physician Assistant

## 2021-09-13 ENCOUNTER — Other Ambulatory Visit: Payer: Self-pay

## 2021-09-13 VITALS — BP 140/80 | HR 77 | Ht 72.0 in | Wt 246.8 lb

## 2021-09-13 DIAGNOSIS — I480 Paroxysmal atrial fibrillation: Secondary | ICD-10-CM

## 2021-09-13 DIAGNOSIS — E119 Type 2 diabetes mellitus without complications: Secondary | ICD-10-CM | POA: Diagnosis not present

## 2021-09-13 DIAGNOSIS — I10 Essential (primary) hypertension: Secondary | ICD-10-CM | POA: Diagnosis not present

## 2021-09-13 DIAGNOSIS — Z79899 Other long term (current) drug therapy: Secondary | ICD-10-CM

## 2021-09-13 DIAGNOSIS — R1013 Epigastric pain: Secondary | ICD-10-CM | POA: Diagnosis not present

## 2021-09-13 MED ORDER — METOPROLOL TARTRATE 50 MG PO TABS
50.0000 mg | ORAL_TABLET | Freq: Two times a day (BID) | ORAL | 3 refills | Status: DC
Start: 2021-09-13 — End: 2022-10-02

## 2021-09-13 NOTE — Progress Notes (Signed)
Cardiology Office Note:    Date:  09/15/2021   ID:  Kevin [name], DOB 1946/11/15, MRN [id number]  PCP:  Lavone Orn, MD   [facility] Providers Cardiologist:  Kirk Ruths, MD     Referring MD: Lavone Orn, MD   Chief Complaint  Patient presents with   Follow-up    Seen for Dr. Stanford Breed    History of Present Illness:    Kevin [name] is a 75 y.o. male with a hx of atrial fibrillation, obstructive sleep apnea, history of syncope, hypertension and DM2.  Patient was previously admitted with encephalopathy and fever and found to have pneumonia.  He developed atrial fibrillation but converted back to sinus rhythm.  Echocardiogram obtained in March 2021 showed normal EF, grade 1 DD, mild LVH, mild LAE, trace aortic and mitral regurgitation.  Patient was last seen by Dr. Stanford Breed in October 2021 at which time he was doing well.  Patient presents today for follow-up.  He denies any significant palpitation and worsening dyspnea.  He has a intermittent lower chest and upper gastric pain intermittently that occurs at night after he eats food.  He think is probably acid reflux, he usually take additional dose of Prilosec which will take some time away.  I also recommended OTC famotidine on as-needed basis for this atypical symptoms.  Of course if he has a worsening symptom that become more frequent or last longer, we can consider a nuclear stress test, at this time I recommended continued observation given the atypical nature.  Blood pressure has been elevated, I will increase metoprolol tartrate to 50 mg twice a day.  He will need annual CBC and a basic metabolic panel and he can follow-up with Dr. Stanford Breed in 6 months  Past Medical History:  Diagnosis Date   Diabetes mellitus    Fall    FUO (fever of unknown origin) 01/19/2016   GERD (gastroesophageal reflux disease)    History of hiatal hernia    Hypertension    Occasional tremors    Prostatitis 01/19/2016   Sleep apnea    . Last  test was before 2007 , not sure name of test site. uses CPAP   Syncope    Wears glasses     Past Surgical History:  Procedure Laterality Date   BACK SURGERY     CHOLECYSTECTOMY N/A 06/15/2016   Procedure: LAPAROSCOPIC CHOLECYSTECTOMY WITH INTRAOPERATIVE CHOLANGIOGRAM;  Surgeon: Armandina Gemma, MD;  Location: [facility];  Service: General;  Laterality: N/A;   Colonoscopy  2011   EYE SURGERY     Cataract 2013   HIATAL HERNIA REPAIR     x2   KNEE ARTHROSCOPY     bil   LUMBAR FUSION  2013   PATELLA FRACTURE SURGERY      Current Medications: Current Meds  Medication Sig   Acai 500 MG CAPS Take by mouth.   amLODipine (NORVASC) 5 MG tablet Take 5 mg by mouth daily.   atorvastatin (LIPITOR) 80 MG tablet Take 80 mg by mouth at bedtime.    ferrous sulfate 325 (65 FE) MG EC tablet Take 325 mg by mouth 3 (three) times daily with meals. Takes every Friday.   gabapentin (NEURONTIN) 100 MG capsule Take 100 mg by mouth 3 (three) times daily.   losartan (COZAAR) 100 MG tablet Take 100 mg by mouth daily.   metFORMIN (GLUCOPHAGE) 1000 MG tablet Take 1,000 mg by mouth 2 (two) times daily with a meal.    NOVOLIN 70/30 (70-30) 100  UNIT/ML injection Inject 45 Units into the skin 2 (two) times daily with a meal.    omeprazole (PRILOSEC) 40 MG capsule Take 40 mg by mouth daily.   oxybutynin (DITROPAN-XL) 5 MG 24 hr tablet Take 5 mg by mouth at bedtime.   Oxycodone HCl 10 MG TABS Take 10 mg by mouth every 4 (four) hours as needed (for pain).    potassium chloride (KLOR-CON) 20 MEQ packet Take by mouth 2 (two) times daily.   Resveratrol 100 MG CAPS Take by mouth.   tadalafil (CIALIS) 5 MG tablet Take 5 mg by mouth daily as needed for erectile dysfunction.   tamsulosin (FLOMAX) 0.4 MG CAPS capsule Take 0.4 mg by mouth daily.   Turmeric (QC TUMERIC COMPLEX) 500 MG CAPS Take by mouth.   VICTOZA 18 MG/3ML SOPN Inject 1.2 mg into the skin daily.   vitamin B-12 (CYANOCOBALAMIN) 1000 MCG tablet Take 1,000 mcg by  mouth daily.   XARELTO 20 MG TABS tablet TAKE 1 TABLET BY MOUTH DAILY WITH SUPPER   [DISCONTINUED] metoprolol tartrate (LOPRESSOR) 25 MG tablet Take 1 tablet (25 mg total) by mouth 2 (two) times daily.     Allergies:   Patient has no known allergies.   Social History   Socioeconomic History   Marital status: Married    Spouse name: Not on file   Number of children: Not on file   Years of education: Not on file   Highest education level: Not on file  Occupational History   Not on file  Tobacco Use   Smoking status: Former    Packs/day: 1.00    Years: 4.00    Pack years: 4.00    Types: Cigarettes    Quit date: 09/04/1980    Years since quitting: 41.0   Smokeless tobacco: Never  Substance and Sexual Activity   Alcohol use: No    Alcohol/week: 0.0 standard drinks    Comment: rarely   Drug use: No   Sexual activity: Not on file  Other Topics Concern   Not on file  Social History Narrative   Not on file   Social Determinants of Health   Financial Resource Strain: Not on file  Food Insecurity: Not on file  Transportation Needs: Not on file  Physical Activity: Not on file  Stress: Not on file  Social Connections: Not on file     Family History: The patient's family history includes Coronary artery disease in an other family member.  ROS:   Please see the history of present illness.     All other systems reviewed and are negative.  EKGs/Labs/Other Studies Reviewed:    The following studies were reviewed today:  Echo 02/27/2020  1. Normal LV systolic function; grade 1 diastolic dysfunction; mild LVH;  mildly dilated aortic root; mild LAE; trace AI and MR.   2. Left ventricular ejection fraction, by estimation, is 55 to 60%. The  left ventricle has normal function. The left ventricle has no regional  wall motion abnormalities. There is mild left ventricular hypertrophy.  Left ventricular diastolic parameters  are consistent with Grade I diastolic dysfunction  (impaired relaxation).   3. Right ventricular systolic function is normal. The right ventricular  size is normal.   4. Left atrial size was mildly dilated.   5. The mitral valve is normal in structure and function. Trivial mitral  valve regurgitation. No evidence of mitral stenosis.   6. The aortic valve has an indeterminant number of cusps. Aortic valve  regurgitation  is trivial. No aortic stenosis is present.   7. Aortic dilatation noted. 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   EKG:  EKG is ordered today.  The ekg ordered today demonstrates normal sinus rhythm, no significant ST-T wave changes  Recent Labs: 09/13/2021: BUN 12; Creatinine, Ser 0.97; Hemoglobin 11.8; Platelets 217; Potassium 4.6; Sodium 138  Recent Lipid Panel No results found for: CHOL, TRIG, HDL, CHOLHDL, VLDL, LDLCALC, LDLDIRECT   Risk Assessment/Calculations:    CHA2DS2-VASc Score = 4   This indicates a 4.8% annual risk of stroke. The patient's score is based upon: CHF History: 0 HTN History: 1 Diabetes History: 1 Stroke History: 0 Vascular Disease History: 0 Age Score: 2 Gender Score: 0          Physical Exam:    VS:  BP 140/80 (BP Location: Right Arm)   Pulse 77   Ht 6' (1.829 m)   Wt 246 lb 12.8 oz (111.9 kg)   BMI 33.47 kg/m     Wt Readings from Last 3 Encounters:  09/13/21 246 lb 12.8 oz (111.9 kg)  10/06/20 246 lb 6.4 oz (111.8 kg)  03/26/20 243 lb 3.2 oz (110.3 kg)     GEN:  Well nourished, well developed in no acute distress HEENT: Normal NECK: No JVD; No carotid bruits LYMPHATICS: No lymphadenopathy CARDIAC: RRR, no murmurs, rubs, gallops RESPIRATORY:  Clear to auscultation without rales, wheezing or rhonchi  ABDOMEN: Soft, non-tender, non-distended MUSCULOSKELETAL:  No edema; No deformity  SKIN: Warm and dry NEUROLOGIC:  Alert and oriented x 3 PSYCHIATRIC:  Normal affect   ASSESSMENT:    1. PAF (paroxysmal atrial fibrillation) ([place])   2. Medication  management   3. Primary hypertension   4. Controlled type 2 diabetes mellitus without complication, without long-term current use of insulin (HCC)   5. Epigastric pain    PLAN:    In order of problems listed above:  PAF: Maintaining sinus rhythm today.  Continue Xarelto  Hypertension: Blood pressure elevated, will increase metoprolol to 50 mg twice a day  DM2: Managed by primary care provider  Lower chest and epigastric pain: Symptom typically occurs after eating, this sounds like acid reflux.  We will continue monitoring and to use OTC medication as needed.         Medication Adjustments/Labs and Tests Ordered: Current medicines are reviewed at length with the patient today.  Concerns regarding medicines are outlined above.  Orders Placed This Encounter  Procedures   Basic metabolic panel   CBC   EKG 12-Lead   Meds ordered this encounter  Medications   metoprolol tartrate (LOPRESSOR) 50 MG tablet    Sig: Take 1 tablet (50 mg total) by mouth 2 (two) times daily.    Dispense:  180 tablet    Refill:  3    Dose change, new Rx    Patient Instructions  Medication Instructions:  INCREASE Metoprolol Tartrate (Lopressor) to 50 mg 2 times a day  May take over the counter Famotidine break through acid reflux  *If you need a refill on your cardiac medications before your next appointment, please call your pharmacy*  Lab Work: Your physician recommends that you return for lab work TODAY:  BMET CBC  If you have labs (blood work) drawn today and your tests are completely normal, you will receive your results only by: [employer] (if you have MyChart) OR A paper copy in the mail If you have any lab test that  is abnormal or we need to change your treatment, we will call you to review the results.  Testing/Procedures: NONE ordered at this time of appointment   Follow-Up: At [facility], you and your health needs are our priority.  As part of our continuing mission to  provide you with exceptional heart care, we have created designated Provider Care Teams.  These Care Teams include your primary Cardiologist (physician) and Advanced Practice Providers (APPs -  Physician Assistants and Nurse Practitioners) who all work together to provide you with the care you need, when you need it.  Your next appointment:   6 month(s)  The format for your next appointment:   In Person  Provider:   Kirk Ruths, MD  Other Instructions If chest discomfort continues or worsens please give our office a call    Signed, Almyra Deforest, [place]  09/15/2021 11:26 PM    [place]

## 2021-09-13 NOTE — Patient Instructions (Addendum)
Medication Instructions:  INCREASE Metoprolol Tartrate (Lopressor) to 50 mg 2 times a day  May take over the counter Famotidine break through acid reflux  *If you need a refill on your cardiac medications before your next appointment, please call your pharmacy*  Lab Work: Your physician recommends that you return for lab work TODAY:  BMET CBC  If you have labs (blood work) drawn today and your tests are completely normal, you will receive your results only by: [employer] (if you have [place]) OR A paper copy in the mail If you have any lab test that is abnormal or we need to change your treatment, we will call you to review the results.  Testing/Procedures: NONE ordered at this time of appointment   Follow-Up: At [facility], you and your health needs are our priority.  As part of our continuing mission to provide you with exceptional heart care, we have created designated Provider Care Teams.  These Care Teams include your primary Cardiologist (physician) and Advanced Practice Providers (APPs -  Physician Assistants and Nurse Practitioners) who all work together to provide you with the care you need, when you need it.  Your next appointment:   6 month(s)  The format for your next appointment:   In Person  Provider:   Kirk Ruths, MD  Other Instructions If chest discomfort continues or worsens please give our office a call

## 2021-09-14 LAB — BASIC METABOLIC PANEL
BUN/Creatinine Ratio: 12 (ref 10–24)
BUN: 12 mg/dL (ref 8–27)
CO2: 22 mmol/L (ref 20–29)
Calcium: 9.3 mg/dL (ref 8.6–10.2)
Chloride: 100 mmol/L (ref 96–106)
Creatinine, Ser: 0.97 mg/dL (ref 0.76–1.27)
Glucose: 185 mg/dL — ABNORMAL HIGH (ref 65–99)
Potassium: 4.6 mmol/L (ref 3.5–5.2)
Sodium: 138 mmol/L (ref 134–144)
eGFR: 81 mL/min/{1.73_m2} (ref >59)

## 2021-09-14 LAB — CBC
Hematocrit: 37.4 % — ABNORMAL LOW (ref 37.5–51.0)
Hemoglobin: 11.8 g/dL — ABNORMAL LOW (ref 13.0–17.7)
MCH: 26.8 pg (ref 26.6–33.0)
MCHC: 31.6 g/dL (ref 31.5–35.7)
MCV: 85 fL (ref 79–97)
Platelets: 217 10*3/uL (ref 150–450)
RBC: 4.4 x10E6/uL (ref 4.14–5.80)
RDW: 13.9 % (ref 11.6–15.4)
WBC: 5.9 10*3/uL (ref 3.4–10.8)

## 2021-09-15 ENCOUNTER — Encounter: Payer: Self-pay | Admitting: Physician Assistant

## 2021-10-03 DIAGNOSIS — G4733 Obstructive sleep apnea (adult) (pediatric): Secondary | ICD-10-CM | POA: Diagnosis not present

## 2021-10-25 DIAGNOSIS — Z794 Long term (current) use of insulin: Secondary | ICD-10-CM | POA: Diagnosis not present

## 2021-10-25 DIAGNOSIS — Z7984 Long term (current) use of oral hypoglycemic drugs: Secondary | ICD-10-CM | POA: Diagnosis not present

## 2021-10-25 DIAGNOSIS — I2510 Atherosclerotic heart disease of native coronary artery without angina pectoris: Secondary | ICD-10-CM | POA: Diagnosis not present

## 2021-10-25 DIAGNOSIS — E1142 Type 2 diabetes mellitus with diabetic polyneuropathy: Secondary | ICD-10-CM | POA: Diagnosis not present

## 2021-10-25 DIAGNOSIS — E1165 Type 2 diabetes mellitus with hyperglycemia: Secondary | ICD-10-CM | POA: Diagnosis not present

## 2021-11-15 DIAGNOSIS — M961 Postlaminectomy syndrome, not elsewhere classified: Secondary | ICD-10-CM | POA: Diagnosis not present

## 2021-11-15 DIAGNOSIS — G894 Chronic pain syndrome: Secondary | ICD-10-CM | POA: Diagnosis not present

## 2021-11-15 DIAGNOSIS — R69 Illness, unspecified: Secondary | ICD-10-CM | POA: Diagnosis not present

## 2021-11-15 DIAGNOSIS — F1120 Opioid dependence, uncomplicated: Secondary | ICD-10-CM | POA: Diagnosis not present

## 2021-11-15 DIAGNOSIS — Z6831 Body mass index (BMI) 31.0-31.9, adult: Secondary | ICD-10-CM | POA: Diagnosis not present

## 2021-11-15 DIAGNOSIS — I10 Essential (primary) hypertension: Secondary | ICD-10-CM | POA: Diagnosis not present

## 2021-11-23 DIAGNOSIS — N401 Enlarged prostate with lower urinary tract symptoms: Secondary | ICD-10-CM | POA: Diagnosis not present

## 2021-11-23 DIAGNOSIS — N400 Enlarged prostate without lower urinary tract symptoms: Secondary | ICD-10-CM | POA: Diagnosis not present

## 2021-11-23 DIAGNOSIS — N182 Chronic kidney disease, stage 2 (mild): Secondary | ICD-10-CM | POA: Diagnosis not present

## 2021-11-23 DIAGNOSIS — I10 Essential (primary) hypertension: Secondary | ICD-10-CM | POA: Diagnosis not present

## 2021-11-23 DIAGNOSIS — E1165 Type 2 diabetes mellitus with hyperglycemia: Secondary | ICD-10-CM | POA: Diagnosis not present

## 2021-11-23 DIAGNOSIS — K219 Gastro-esophageal reflux disease without esophagitis: Secondary | ICD-10-CM | POA: Diagnosis not present

## 2021-11-23 DIAGNOSIS — E1142 Type 2 diabetes mellitus with diabetic polyneuropathy: Secondary | ICD-10-CM | POA: Diagnosis not present

## 2021-11-23 DIAGNOSIS — E1122 Type 2 diabetes mellitus with diabetic chronic kidney disease: Secondary | ICD-10-CM | POA: Diagnosis not present

## 2021-11-23 DIAGNOSIS — I480 Paroxysmal atrial fibrillation: Secondary | ICD-10-CM | POA: Diagnosis not present

## 2021-11-24 ENCOUNTER — Other Ambulatory Visit: Payer: Self-pay | Admitting: Cardiology

## 2021-11-24 NOTE — Telephone Encounter (Signed)
Prescription refill request for Xarelto received.  Indication:Afib Last office visit:9/22 Weight:111.9 kg Age:75 Scr:0.9 CrCl:112.25 ml/min  Prescription refilled

## 2021-11-29 DIAGNOSIS — N400 Enlarged prostate without lower urinary tract symptoms: Secondary | ICD-10-CM | POA: Diagnosis not present

## 2021-11-29 DIAGNOSIS — Z1389 Encounter for screening for other disorder: Secondary | ICD-10-CM | POA: Diagnosis not present

## 2021-11-29 DIAGNOSIS — Z0000 Encounter for general adult medical examination without abnormal findings: Secondary | ICD-10-CM | POA: Diagnosis not present

## 2021-11-29 DIAGNOSIS — I700 Atherosclerosis of aorta: Secondary | ICD-10-CM | POA: Diagnosis not present

## 2021-11-29 DIAGNOSIS — K219 Gastro-esophageal reflux disease without esophagitis: Secondary | ICD-10-CM | POA: Diagnosis not present

## 2021-11-29 DIAGNOSIS — I480 Paroxysmal atrial fibrillation: Secondary | ICD-10-CM | POA: Diagnosis not present

## 2021-11-29 DIAGNOSIS — G4733 Obstructive sleep apnea (adult) (pediatric): Secondary | ICD-10-CM | POA: Diagnosis not present

## 2021-11-29 DIAGNOSIS — E1122 Type 2 diabetes mellitus with diabetic chronic kidney disease: Secondary | ICD-10-CM | POA: Diagnosis not present

## 2021-11-29 DIAGNOSIS — I10 Essential (primary) hypertension: Secondary | ICD-10-CM | POA: Diagnosis not present

## 2021-11-29 DIAGNOSIS — D6869 Other thrombophilia: Secondary | ICD-10-CM | POA: Diagnosis not present

## 2021-12-30 DIAGNOSIS — G4733 Obstructive sleep apnea (adult) (pediatric): Secondary | ICD-10-CM | POA: Diagnosis not present

## 2022-01-03 DIAGNOSIS — G4733 Obstructive sleep apnea (adult) (pediatric): Secondary | ICD-10-CM | POA: Diagnosis not present

## 2022-01-09 DIAGNOSIS — E1165 Type 2 diabetes mellitus with hyperglycemia: Secondary | ICD-10-CM | POA: Diagnosis not present

## 2022-01-09 DIAGNOSIS — N182 Chronic kidney disease, stage 2 (mild): Secondary | ICD-10-CM | POA: Diagnosis not present

## 2022-01-09 DIAGNOSIS — E1142 Type 2 diabetes mellitus with diabetic polyneuropathy: Secondary | ICD-10-CM | POA: Diagnosis not present

## 2022-01-09 DIAGNOSIS — E1122 Type 2 diabetes mellitus with diabetic chronic kidney disease: Secondary | ICD-10-CM | POA: Diagnosis not present

## 2022-01-09 DIAGNOSIS — I10 Essential (primary) hypertension: Secondary | ICD-10-CM | POA: Diagnosis not present

## 2022-01-30 DIAGNOSIS — G894 Chronic pain syndrome: Secondary | ICD-10-CM | POA: Diagnosis not present

## 2022-01-30 DIAGNOSIS — Z6831 Body mass index (BMI) 31.0-31.9, adult: Secondary | ICD-10-CM | POA: Diagnosis not present

## 2022-01-30 DIAGNOSIS — G4733 Obstructive sleep apnea (adult) (pediatric): Secondary | ICD-10-CM | POA: Diagnosis not present

## 2022-02-27 DIAGNOSIS — G4733 Obstructive sleep apnea (adult) (pediatric): Secondary | ICD-10-CM | POA: Diagnosis not present

## 2022-03-09 NOTE — Progress Notes (Signed)
? ? ? ? ?LKG:[id number] atrial fibrillation.  Patient previously admitted with encephalopathy and fever and found to have pneumonia.  He developed atrial fibrillation but converted back to sinus rhythm.  Echocardiogram March 2021 showed normal LV function, grade 1 diastolic dysfunction, mild left ventricular hypertrophy, mild left atrial enlargement, trace aortic and mitral regurgitation.  Since last seen, the patient has dyspnea with more extreme activities but not with routine activities. It is relieved with rest. It is not associated with chest pain. There is no orthopnea, PND or pedal edema. There is no syncope or palpitations. There is no exertional chest pain. ? ? ?Current Outpatient Medications  ?Medication Sig Dispense Refill  ? Acai 500 MG CAPS Take by mouth.    ? amLODipine (NORVASC) 5 MG tablet Take 5 mg by mouth daily.    ? atorvastatin (LIPITOR) 80 MG tablet Take 80 mg by mouth at bedtime.     ? ferrous sulfate 325 (65 FE) MG EC tablet Take 325 mg by mouth 3 (three) times daily with meals. Takes every Friday.    ? gabapentin (NEURONTIN) 100 MG capsule Take 200 mg by mouth 3 (three) times daily.    ? losartan (COZAAR) 100 MG tablet Take 100 mg by mouth daily.    ? metFORMIN (GLUCOPHAGE) 1000 MG tablet Take 1,000 mg by mouth 2 (two) times daily with a meal.     ? metoprolol tartrate (LOPRESSOR) 50 MG tablet Take 1 tablet (50 mg total) by mouth 2 (two) times daily. 180 tablet 3  ? NOVOLIN 70/30 (70-30) 100 UNIT/ML injection Inject 45 Units into the skin 2 (two) times daily with a meal.     ? omeprazole (PRILOSEC) 40 MG capsule Take 40 mg by mouth daily.    ? oxybutynin (DITROPAN-XL) 5 MG 24 hr tablet Take 5 mg by mouth at bedtime.    ? Oxycodone HCl 10 MG TABS Take 10 mg by mouth every 4 (four) hours as needed (for pain).     ? potassium chloride (KLOR-CON) 20 MEQ packet Take by mouth 2 (two) times daily.    ? Resveratrol 100 MG CAPS Take by mouth.    ? rivaroxaban (XARELTO) 20 MG TABS tablet TAKE ONE  TABLET BY MOUTH DAILY WITH SUPPER 30 tablet 5  ? tadalafil (CIALIS) 5 MG tablet Take 5 mg by mouth daily as needed for erectile dysfunction.    ? tamsulosin (FLOMAX) 0.4 MG CAPS capsule Take 0.4 mg by mouth in the morning and at bedtime.    ? Turmeric 500 MG CAPS Take by mouth. TAKE EVERY THIRD DAY    ? vitamin B-12 (CYANOCOBALAMIN) 1000 MCG tablet Take 1,000 mcg by mouth daily.    ? ?No current facility-administered medications for this visit.  ? ? ? ?Past Medical History:  ?Diagnosis Date  ? Diabetes mellitus   ? Fall   ? FUO (fever of unknown origin) 01/19/2016  ? GERD (gastroesophageal reflux disease)   ? History of hiatal hernia   ? Hypertension   ? Occasional tremors   ? Prostatitis 01/19/2016  ? Sleep apnea   ? Marland Kitchen Last test was before 2007 , not sure name of test site. uses CPAP  ? Syncope   ? Wears glasses   ? ? ?Past Surgical History:  ?Procedure Laterality Date  ? BACK SURGERY    ? CHOLECYSTECTOMY N/A 06/15/2016  ? Procedure: LAPAROSCOPIC CHOLECYSTECTOMY WITH INTRAOPERATIVE CHOLANGIOGRAM;  Surgeon: Armandina Gemma, MD;  Location: [facility];  Service: General;  Laterality: N/A;  ?  Colonoscopy  2011  ? EYE SURGERY    ? Cataract 2013  ? HIATAL HERNIA REPAIR    ? x2  ? KNEE ARTHROSCOPY    ? bil  ? LUMBAR FUSION  2013  ? PATELLA FRACTURE SURGERY    ? ? ?Social History  ? ?Socioeconomic History  ? Marital status: Married  ?  Spouse name: Not on file  ? Number of children: Not on file  ? Years of education: Not on file  ? Highest education level: Not on file  ?Occupational History  ? Not on file  ?Tobacco Use  ? Smoking status: Former  ?  Packs/day: 1.00  ?  Years: 4.00  ?  Pack years: 4.00  ?  Types: Cigarettes  ?  Quit date: 09/04/1980  ?  Years since quitting: 41.5  ? Smokeless tobacco: Never  ?Substance and Sexual Activity  ? Alcohol use: No  ?  Alcohol/week: 0.0 standard drinks  ?  Comment: rarely  ? Drug use: No  ? Sexual activity: Not on file  ?Other Topics Concern  ? Not on file  ?Social History Narrative  ? Not on  file  ? ?Social Determinants of Health  ? ?Financial Resource Strain: Not on file  ?Food Insecurity: Not on file  ?Transportation Needs: Not on file  ?Physical Activity: Not on file  ?Stress: Not on file  ?Social Connections: Not on file  ?Intimate Partner Violence: Not on file  ? ? ?Family History  ?Problem Relation Age of Onset  ? Coronary artery disease Other   ? ? ?ROS: Back pain but no fevers or chills, productive cough, hemoptysis, dysphasia, odynophagia, melena, hematochezia, dysuria, hematuria, rash, seizure activity, orthopnea, PND, pedal edema, claudication. Remaining systems are negative. ? ?Physical Exam: ?Well-developed well-nourished in no acute distress.  ?Skin is warm and dry.  ?HEENT is normal.  ?Neck is supple.  ?Chest is clear to auscultation with normal expansion.  ?Cardiovascular exam is regular rate and rhythm.  ?Abdominal exam nontender or distended. No masses palpated. ?Extremities show no edema. ?neuro grossly intact ? ?A/P ? ?1 paroxysmal atrial fibrillation-patient remains in sinus rhythm on exam today.  We will continue metoprolol for rate control if atrial fibrillation recurs.  Continue Xarelto.  Note we again discussed his prior episode of atrial fibrillation.  This occurred in the setting of an infection however he was asymptomatic and I am concerned that he potentially could be having asymptomatic bouts at home.  We have therefore continued anticoagulation long-term. ? ?2 hypertension-blood pressure controlled.  Continue present medical regimen. ? ?Kirk Ruths, MD ? ? ? ?

## 2022-03-17 ENCOUNTER — Encounter: Payer: Self-pay | Admitting: Cardiology

## 2022-03-17 ENCOUNTER — Other Ambulatory Visit: Payer: Self-pay

## 2022-03-17 ENCOUNTER — Ambulatory Visit: Payer: Medicare HMO | Admitting: Cardiology

## 2022-03-17 VITALS — BP 118/70 | HR 69 | Ht 72.0 in | Wt 246.0 lb

## 2022-03-17 DIAGNOSIS — I10 Essential (primary) hypertension: Secondary | ICD-10-CM | POA: Diagnosis not present

## 2022-03-17 DIAGNOSIS — I480 Paroxysmal atrial fibrillation: Secondary | ICD-10-CM

## 2022-03-17 NOTE — Patient Instructions (Signed)

## 2022-04-10 DIAGNOSIS — G4733 Obstructive sleep apnea (adult) (pediatric): Secondary | ICD-10-CM | POA: Diagnosis not present

## 2022-04-17 DIAGNOSIS — E1142 Type 2 diabetes mellitus with diabetic polyneuropathy: Secondary | ICD-10-CM | POA: Diagnosis not present

## 2022-04-17 DIAGNOSIS — I10 Essential (primary) hypertension: Secondary | ICD-10-CM | POA: Diagnosis not present

## 2022-04-17 DIAGNOSIS — I480 Paroxysmal atrial fibrillation: Secondary | ICD-10-CM | POA: Diagnosis not present

## 2022-04-17 DIAGNOSIS — K219 Gastro-esophageal reflux disease without esophagitis: Secondary | ICD-10-CM | POA: Diagnosis not present

## 2022-04-17 DIAGNOSIS — N401 Enlarged prostate with lower urinary tract symptoms: Secondary | ICD-10-CM | POA: Diagnosis not present

## 2022-04-20 DIAGNOSIS — E1165 Type 2 diabetes mellitus with hyperglycemia: Secondary | ICD-10-CM | POA: Diagnosis not present

## 2022-04-20 DIAGNOSIS — E1142 Type 2 diabetes mellitus with diabetic polyneuropathy: Secondary | ICD-10-CM | POA: Diagnosis not present

## 2022-04-20 DIAGNOSIS — I2510 Atherosclerotic heart disease of native coronary artery without angina pectoris: Secondary | ICD-10-CM | POA: Diagnosis not present

## 2022-04-20 DIAGNOSIS — Z794 Long term (current) use of insulin: Secondary | ICD-10-CM | POA: Diagnosis not present

## 2022-05-01 DIAGNOSIS — G894 Chronic pain syndrome: Secondary | ICD-10-CM | POA: Diagnosis not present

## 2022-05-01 DIAGNOSIS — R69 Illness, unspecified: Secondary | ICD-10-CM | POA: Diagnosis not present

## 2022-05-04 DIAGNOSIS — N401 Enlarged prostate with lower urinary tract symptoms: Secondary | ICD-10-CM | POA: Diagnosis not present

## 2022-05-04 DIAGNOSIS — N200 Calculus of kidney: Secondary | ICD-10-CM | POA: Diagnosis not present

## 2022-05-04 DIAGNOSIS — R351 Nocturia: Secondary | ICD-10-CM | POA: Diagnosis not present

## 2022-05-08 ENCOUNTER — Telehealth: Payer: Self-pay | Admitting: Cardiology

## 2022-05-08 DIAGNOSIS — I700 Atherosclerosis of aorta: Secondary | ICD-10-CM

## 2022-05-08 NOTE — Telephone Encounter (Signed)
? ?  Pre-operative Risk Assessment  ?  ?Patient Name: Kevin [name]  ?DOB: 12/24/1946 ?MRN: [id number]  ? ?  ? ?Request for Surgical Clearance   ? ?Procedure:  Extracorporeal Shockwave Lithotripsy ? ?Date of Surgery:  Clearance TBD                              ?   ?Surgeon:  Dr Harrell Gave Lovena Neighbours  ?Surgeon's Group or Practice Name:  [facility] ?Phone number:  [phone] ext [phone] ?Fax number:  [phone] ?  ?Type of Clearance Requested:   ?- Medical  ?- Pharmacy:  Hold Rivaroxaban (Xarelto) Would need to be held 72 hrs prior to procedure  ?  ?Type of Anesthesia:  Local  ?  ?Additional requests/questions:   Patient must have EKG within the past 6 months  ? ?Signed, ?April Henson   ?05/08/2022, 1:09 PM   ?

## 2022-05-09 DIAGNOSIS — I700 Atherosclerosis of aorta: Secondary | ICD-10-CM | POA: Insufficient documentation

## 2022-05-09 NOTE — Telephone Encounter (Signed)
Left message for the pt to call back and ask to s/w the pre op team. See pre op notes in regard to appt and ekg needed ?

## 2022-05-09 NOTE — Telephone Encounter (Signed)
Patient with diagnosis of afib on Xarelto for anticoagulation.   ? ?Procedure: Extracorporeal Shockwave Lithotripsy ?Date of procedure: TBD ? ?CHA2DS2-VASc Score = 5  ?This indicates a 7.2% annual risk of stroke. ?The patient's score is based upon: ?CHF History: 0 ?HTN History: 1 ?Diabetes History: 1 ?Stroke History: 0 ?Vascular Disease History: 1 ?Age Score: 2 ?Gender Score: 0 ?  ?Aortic atherosclerosis noted on abdominal CT from 02/2020, PMH updated. ? ?CrCl 87m/min using adjusted body weight ?Platelet count 217K ? ?Per office protocol, patient can hold Xarelto for 3 days prior to procedure as requested.   ?

## 2022-05-09 NOTE — Telephone Encounter (Signed)
? ? ?  Name: Kevin [name]  ?DOB: 12-20-1946  ?MRN: [id number] ? ?Primary Cardiologist: Kirk Ruths, MD ? ? ?Preoperative team, please contact this patient and set up a phone call appointment for further preoperative risk assessment. Please obtain consent and complete medication review.  Per surgical clearance request, patient will require EKG for clearance.  Patient was last seen in office on 03/16/2022. If patient prefers an office visit to virtual visit, please schedule. Otherwise, patient may proceed with virtual visit and to have in office EKG separately.  Thank you for your help. ? ?I confirm that guidance regarding antiplatelet and oral anticoagulation therapy has been completed and, if necessary, noted below. ? ?Patient with diagnosis of afib on Xarelto for anticoagulation.   ?  ?Procedure: Extracorporeal Shockwave Lithotripsy ?Date of procedure: TBD ?  ?CHA2DS2-VASc Score = 5  ?This indicates a 7.2% annual risk of stroke. ?The patient's score is based upon: ?CHF History: 0 ?HTN History: 1 ?Diabetes History: 1 ?Stroke History: 0 ?Vascular Disease History: 1 ?Age Score: 2 ?Gender Score: 0 ?  ?Aortic atherosclerosis noted on abdominal CT from 02/2020, PMH updated. ?  ?CrCl 2m/min using adjusted body weight ?Platelet count 217K ?  ?Per office protocol, patient can hold Xarelto for 3 days prior to procedure as requested.   ? ?ELenna Sciara NP ?05/09/2022, 11:08 AM ?[facility]?[address] ?[place] [address]? ? ?

## 2022-05-10 ENCOUNTER — Telehealth: Payer: Self-pay | Admitting: *Deleted

## 2022-05-10 DIAGNOSIS — G4733 Obstructive sleep apnea (adult) (pediatric): Secondary | ICD-10-CM | POA: Diagnosis not present

## 2022-05-10 NOTE — Telephone Encounter (Signed)
Pt agreeable to plan of care. Pt will first need an updated EKG for pre op clearance per surgeon office if not recent with in 6 months. Per pre op provider last EKG was 08/2021, need EKG to be done. Pt will come in the office [address] 11 am for EKG. Pre op CMA tomorrow will be April Garrison. Please call April to do EKG.  ? ?Med rec and consent are done.  ?  ?Pt has then been scheduled for a tele pre op appt 05/12/22 @ 3 pm. Pt thanked me for the call and the help in this matter. I will update the requesting office of the plan of care for the pt at this time.  ?

## 2022-05-10 NOTE — Telephone Encounter (Signed)
Med rec and consent are done.  ? ?  ?Patient Consent for Virtual Visit  ? ? ?   ? ?Kevin [name] has provided verbal consent on 05/10/2022 for a virtual visit (video or telephone). ? ? ?CONSENT FOR VIRTUAL VISIT FOR:  Kevin [name]  ?By participating in this virtual visit I agree to the following: ? ?I hereby voluntarily request, consent and authorize [facility] and its employed or contracted physicians, physician assistants, nurse practitioners or other licensed health care professionals (the Practitioner), to provide me with telemedicine health care services (the ?Services") as deemed necessary by the treating Practitioner. I acknowledge and consent to receive the Services by the Practitioner via telemedicine. I understand that the telemedicine visit will involve communicating with the Practitioner through live audiovisual communication technology and the disclosure of certain medical information by electronic transmission. I acknowledge that I have been given the opportunity to request an in-person assessment or other available alternative prior to the telemedicine visit and am voluntarily participating in the telemedicine visit. ? ?I understand that I have the right to withhold or withdraw my consent to the use of telemedicine in the course of my care at any time, without affecting my right to future care or treatment, and that the Practitioner or I may terminate the telemedicine visit at any time. I understand that I have the right to inspect all information obtained and/or recorded in the course of the telemedicine visit and may receive copies of available information for a reasonable fee.  I understand that some of the potential risks of receiving the Services via telemedicine include:  ?Delay or interruption in medical evaluation due to technological equipment failure or disruption; ?Information transmitted may not be sufficient (e.g. poor resolution of images) to allow for appropriate medical decision  making by the Practitioner; and/or  ?In rare instances, security protocols could fail, causing a breach of personal health information. ? ?Furthermore, I acknowledge that it is my responsibility to provide information about my medical history, conditions and care that is complete and accurate to the best of my ability. I acknowledge that Practitioner's advice, recommendations, and/or decision may be based on factors not within their control, such as incomplete or inaccurate data provided by me or distortions of diagnostic images or specimens that may result from electronic transmissions. I understand that the practice of medicine is not an exact science and that Practitioner makes no warranties or guarantees regarding treatment outcomes. I acknowledge that a copy of this consent can be made available to me via my patient portal ([facility]), or I can request a printed copy by calling the office of [facility].   ? ?I understand that my insurance will be billed for this visit.  ? ?I have read or had this consent read to me. ?I understand the contents of this consent, which adequately explains the benefits and risks of the Services being provided via telemedicine.  ?I have been provided ample opportunity to ask questions regarding this consent and the Services and have had my questions answered to my satisfaction. ?I give my informed consent for the services to be provided through the use of telemedicine in my medical care ? ? ? ?

## 2022-05-10 NOTE — Telephone Encounter (Signed)
Pt agreeable to plan of care. Pt will first need an updated EKG for pre op clearance per surgeon office if not recent with in 6 months. Per pre op provider last EKG was 08/2021, need EKG to be done. Pt will come in the office [address] 11 am for EKG. Pre op CMA tomorrow will be April Garrison. Please call April to do EKG.  ? ?Pt has then been scheduled for a tele pre op appt 05/12/22 @ 3 pm. Pt thanked me for the call and the help in this matter. I will update the requesting office of the plan of care for the pt at this time.  ?

## 2022-05-11 ENCOUNTER — Ambulatory Visit (INDEPENDENT_AMBULATORY_CARE_PROVIDER_SITE_OTHER): Payer: Medicare HMO

## 2022-05-11 VITALS — BP 120/72 | HR 69 | Ht 72.0 in | Wt 247.0 lb

## 2022-05-11 DIAGNOSIS — I480 Paroxysmal atrial fibrillation: Secondary | ICD-10-CM

## 2022-05-11 NOTE — Progress Notes (Signed)
   Nurse Visit   Date of Encounter: 05/11/2022 ID: BERNHARDT RIEMENSCHNEIDER, DOB [birth year], MRN [id number]  PCP:  Lavone Orn, MD   [facility] Providers Cardiologist:  Kirk Ruths, MD      Visit Details   VS:  BP 120/72   Pulse 69   Ht 6' (1.829 m)   Wt 247 lb (112 kg)   SpO2 93%   BMI 33.50 kg/m  , BMI Body mass index is 33.5 kg/m.  Wt Readings from Last 3 Encounters:  05/11/22 247 lb (112 kg)  03/17/22 246 lb (111.6 kg)  09/13/21 246 lb 12.8 oz (111.9 kg)     Reason for visit: EKG for pre-op clearance Performed today: Vitals, EKG, and Provider consulted:EKG showed normal sinus rhythm, no change from prior  Changes (medications, testing, etc.) : NONE Length of Visit: 20 minutes    Medications Adjustments/Labs and Tests Ordered: Orders Placed This Encounter  Procedures   EKG 12-Lead   No orders of the defined types were placed in this encounter.    Kari Baars, RN  05/11/2022 11:36 AM

## 2022-05-12 ENCOUNTER — Ambulatory Visit (INDEPENDENT_AMBULATORY_CARE_PROVIDER_SITE_OTHER): Payer: Medicare HMO | Admitting: Physician Assistant

## 2022-05-12 DIAGNOSIS — Z01810 Encounter for preprocedural cardiovascular examination: Secondary | ICD-10-CM

## 2022-05-12 NOTE — Progress Notes (Signed)
Virtual Visit via Telephone Note   Because of Mayford C Owensby's co-morbid illnesses, he is at least at moderate risk for complications without adequate follow up.  This format is felt to be most appropriate for this patient at this time.  The patient did not have access to video technology/had technical difficulties with video requiring transitioning to audio format only (telephone).  All issues noted in this document were discussed and addressed.  No physical exam could be performed with this format.  Please refer to the patient's chart for his consent to telehealth for [facility].  Evaluation Performed:  Preoperative cardiovascular risk assessment _____________   Date:  05/12/2022   Patient ID:  Kevin [name], DOB [birth year], MRN [id number] Patient Location:  Home Provider location:   Office  Primary Care Provider:  Lavone Orn, MD Primary Cardiologist:  Kirk Ruths, MD  Chief Complaint    76 y.o. y/o male with a h/o paroxysmal atrial fibrillation, HTN, DM, hiatal hernia, sleep apnea, GERD, who is pending Extracorporeal Shockwave Lithotripsy, and presents today for telephonic preoperative cardiovascular risk assessment.  Past Medical History    Past Medical History:  Diagnosis Date   Diabetes mellitus    Fall    FUO (fever of unknown origin) 01/19/2016   GERD (gastroesophageal reflux disease)    History of hiatal hernia    Hypertension    Occasional tremors    Prostatitis 01/19/2016   Sleep apnea    . Last test was before 2007 , not sure name of test site. uses CPAP   Syncope    Wears glasses    Past Surgical History:  Procedure Laterality Date   BACK SURGERY     CHOLECYSTECTOMY N/A 06/15/2016   Procedure: LAPAROSCOPIC CHOLECYSTECTOMY WITH INTRAOPERATIVE CHOLANGIOGRAM;  Surgeon: Armandina Gemma, MD;  Location: [facility];  Service: General;  Laterality: N/A;   Colonoscopy  2011   EYE SURGERY     Cataract 2013   HIATAL HERNIA REPAIR     x2   KNEE ARTHROSCOPY      bil   LUMBAR FUSION  2013   PATELLA FRACTURE SURGERY      Allergies  No Known Allergies  History of Present Illness    Kevin [name] is a 76 y.o. male who presents via audio/video conferencing for a telehealth visit today. Prior notes reviewed. Last echo 02/2020 with Normal LV systolic function; grade 1 diastolic dysfunction; mild LVH; mildly dilated aortic root; mild LAE; trace AI and MR. CT chest 02/2020 did not demonstrate any mention of aortic dilation, but did show aortic atherosclerosis amongst the other medical issues going on at the time. No known history of CAD. Pt was last seen in cardiology clinic on 03/17/22 by Dr. Stanford Breed. At that time Kevin [name] was doing well.  The patient is now pending procedure as outlined above. Since his last visit, he denies any new cardiac symptoms. No chest pain, SOB, palpitations, dizziness or syncope. Follows home exercise routine and also swims at [facility] periodically.   Home Medications    Prior to Admission medications   Medication Sig Start Date End Date Taking? Authorizing Provider  Acai 500 MG CAPS Take by mouth.    [provider]  amLODipine (NORVASC) 5 MG tablet Take 5 mg by mouth daily. 02/17/20   [provider]  atorvastatin (LIPITOR) 80 MG tablet Take 80 mg by mouth at bedtime.     [provider]  ferrous sulfate 325 (65 FE) MG EC tablet  Take 325 mg by mouth 3 (three) times daily with meals. Takes every Friday.    [provider]  gabapentin (NEURONTIN) 100 MG capsule Take 200 mg by mouth 3 (three) times daily.    [provider]  losartan (COZAAR) 100 MG tablet Take 100 mg by mouth daily.    [provider]  metFORMIN (GLUCOPHAGE) 1000 MG tablet Take 1,000 mg by mouth 2 (two) times daily with a meal.     [provider]  metoprolol tartrate (LOPRESSOR) 50 MG tablet Take 1 tablet (50 mg total) by mouth 2 (two) times daily. 09/13/21   Almyra Deforest, PA  NOVOLIN 70/30 (70-30)  100 UNIT/ML injection Inject 45 Units into the skin 2 (two) times daily with a meal.  02/25/16   [provider]  omeprazole (PRILOSEC) 40 MG capsule Take 40 mg by mouth daily.    [provider]  oxybutynin (DITROPAN-XL) 5 MG 24 hr tablet Take 5 mg by mouth at bedtime.    [provider]  Oxycodone HCl 10 MG TABS Take 10 mg by mouth every 4 (four) hours as needed (for pain).  02/22/20   [provider]  potassium chloride (KLOR-CON) 20 MEQ packet Take by mouth 2 (two) times daily.    [provider]  Resveratrol 100 MG CAPS Take by mouth.    [provider]  rivaroxaban (XARELTO) 20 MG TABS tablet TAKE ONE TABLET BY MOUTH DAILY WITH SUPPER 11/24/21   Lelon Perla, MD  tadalafil (CIALIS) 5 MG tablet Take 5 mg by mouth daily as needed for erectile dysfunction.    [provider]  tamsulosin (FLOMAX) 0.4 MG CAPS capsule Take 0.4 mg by mouth in the morning and at bedtime. 12/28/19   [provider]  Turmeric 500 MG CAPS Take by mouth. TAKE EVERY THIRD DAY    [provider]  vitamin B-12 (CYANOCOBALAMIN) 1000 MCG tablet Take 1,000 mcg by mouth daily.    [provider]    Physical Exam    Vital Signs:  Kevin [name] does not have vital signs available for review today but reviewed VS from nurse visit yesterday which were normal.  Given telephonic nature of communication, physical exam is limited. AAOx3. NAD. Normal affect.  Speech and respirations are unlabored.  Accessory Clinical Findings    EKG yesterday showed NSR 69bpm, moderate voltage criteria for LVH, nonspecific STTW changes, DOD reviewed, indicated no changes. The inferior leads appeared slightly different than prior so I reviewed with Dr. Stanford Breed to ensure these did not represent Q waves; he felt this was stable from prior without any acute changes, no concerns at this time.  Assessment & Plan    1.  Preoperative Cardiovascular Risk  Assessment: RCRI 0.4% indicating low CV risk.The patient affirms he has been doing well without any new cardiac symptoms. They are able to achieve over 4 METS without cardiac limitations. Therefore, based on ACC/AHA guidelines, the patient would be at acceptable risk for the planned procedure without further cardiovascular testing. The patient was advised that if he develops new symptoms prior to surgery to contact our office to arrange for a follow-up visit, and he verbalized understanding.    Per pharmD review, Per office protocol, patient can hold Xarelto for 3 days prior to procedure as requested.    A copy of this note will be routed to requesting surgeon.  Time:   Today, I have spent 9.5 minutes with the patient with telehealth technology discussing  medical history, symptoms, and management plan. He had several questions about atrial fibrillation in general that I did my best to answer.    Charlie Pitter, PA-C  05/12/2022, 3:02 PM

## 2022-05-15 ENCOUNTER — Other Ambulatory Visit: Payer: Self-pay | Admitting: Urology

## 2022-05-25 ENCOUNTER — Encounter (HOSPITAL_BASED_OUTPATIENT_CLINIC_OR_DEPARTMENT_OTHER): Payer: Self-pay | Admitting: Urology

## 2022-05-25 NOTE — Progress Notes (Signed)
Patient did not answer for pre-op phone call. Voicemail box was full and message could not be left. Will attempt another call before end of shift.

## 2022-05-25 NOTE — Progress Notes (Signed)
Pre-op phone call attempted for second time with no answer. Will try to call patient on 05/26/2022.

## 2022-05-26 NOTE — Progress Notes (Addendum)
Talked with pt. Went over instructions. Hx and meds reviewed. Stopped Xarelto friday  at 0730  05/26/22. Wife is the driver. Arrival time 0800, clear liquids until 0600  Only to take 1/2 dose of Insulin Sunday night with snack.   Will bring CPAP machine

## 2022-05-28 NOTE — H&P (Addendum)
Recurrent UTIs   Mr. Dome is a 76 year old male with a history of BPH and UTI.   Last PSA- none on file. No personal/family history of prostate cancer   PMHx: CAD, HTN, A-fib, DM2 w/ neuropathy, CKD2, chronic back pain (s/p lumbar fusion in 2013) and ED   Urine culture 08/15/2020: 10,000 CFU of mixed growth  Urine culture 07/27/2020: Staph pseudintermedius (resistant to Cipro and penicillin)   10/04/20: From a urinary standpoint, the patient is currently on tamsulosin once daily and oxybutynin. He reports a adequate FOS and feels like he is emptying his bladder well. He notes that he will void 6-10 times per day with sporadic episodes of urinary urgency. Nocturia x 1-2--denies alcohol or caffeine intake in the evenings. No issues with constipation. Currently on oxycodone due to ongoing back pain. He notes two UTIs over the past several years. During his "UTI" back in August, that was associated with dysuria. He notes that his urinary symptoms quickly resolved after a course of levaquin. No prior history kidney stones.   04/05/21: 76 year old male with the above noted past medical history. Presents today due to gross hematuria. He reports that he had has never had any radiation to his pelvis. He is currently on Xarelto for atrial fibrillation. He has noticed his urine has been reddish in color but has not seen any overt clots. He does have some slight discomfort when he urinates before his flow actually starts. He reports a good flow of stream. He denies straining. Is not a smoker currently but was 1 back in 1980. He denies fevers and chills. He denies a history of nocturia. He denies frequency and urgency. He had a negative urine culture from his primary care doctor and with [place] that to [place]. He had some blood work also done at his primary care doctor on 03/25/2021 and this did include a renal function test which showed a creatinine of 1.06 and a GFR of 73.   04/28/21: The patient is here today for a  routine follow-up and cystoscopy to complete his hematuria evaluation. Recent CTU revealed bilateral non-obstructing (R>L) renal stone with no other GU abnormalities. He denies interval episodes of gross hematuria, UTIs or dysuria. He continues to take tamsulosin with improvement in his force of stream and urinary urgency.   05/04/22: The patient is here today for a routine follow-up and KUB. He has done well over the past year and reports no new health issues. He continues to take tamsulosin and oxybutynin and reports stable LUTS. He denies interval UTIs, dysuria or hematuria. He also denies interval episodes of flank pain or kidney stone passage.     ALLERGIES: None   MEDICATIONS: Metformin Hcl  Metoprolol Tartrate 25 mg tablet  Omeprazole 40 mg capsule,delayed release  Oxybutynin Chloride 5 mg tablet  Tamsulosin Hcl 0.4 mg capsule  Tamsulosin Hcl  Toviaz  Acai  Amlodipine Besylate 5 mg tablet  Atorvastatin Calcium 80 mg tablet  Furosemide  Gabapentin 100 mg capsule  Iron 325 mg (65 mg iron) tablet  Klor-Con M20  Losartan Potassium 100 mg tablet  Multivitamin Women 50 Plus  Novolin 70-30  Oxycodone-Acetaminophen 10 mg-325 mg tablet  Ozempic 2 mg/0.75 ml (8 mg/3 ml) pen injector  Resveratrol 100 mg capsule  Tadalafil 5 mg tablet  Victoza 2-Pak  Victoza 2-Pak 0.6 mg/0.1 ml (18 mg/3 ml) pen injector  Vitamin B12  Xarelto 20 mg tablet     GU PSH: Hernia Repair W/mesh Locm 300-'399Mg'$ /Ml Iodine,1Ml - 04/19/2021  NON-GU PSH: Back Surgery (Unspecified) Cholecystectomy (laparoscopic)     GU PMH: Gross hematuria - 04/28/2021, - 04/19/2021, - 04/05/2021 Renal calculus - 04/28/2021 BPH w/LUTS - 10/04/2020 Nocturia - 10/04/2020    NON-GU PMH: Bacteriuria - 10/04/2020 Arrhythmia, Atrial fibrillation  Arthritis Atrial Fibrillation Diabetes Type 2 Gout Hypercholesterolemia Hypertension Sleep Apnea    FAMILY HISTORY: Cancer - Mother Heart problem - Father   SOCIAL HISTORY:  Marital Status: Married Preferred Language: English Current Smoking Status: Patient does not smoke anymore. Has not smoked since 09/24/1980. Smoked for 5 years.   Tobacco Use Assessment Completed: Used Tobacco in last 30 days? Has never drank.  Drinks 1 caffeinated drink per day.    REVIEW OF SYSTEMS:    GU Review Male:   Patient denies frequent urination, hard to postpone urination, burning/ pain with urination, get up at night to urinate, leakage of urine, stream starts and stops, trouble starting your stream, have to strain to urinate , erection problems, and penile pain.  Gastrointestinal (Upper):   Patient denies nausea, vomiting, and indigestion/ heartburn.  Gastrointestinal (Lower):   Patient denies diarrhea and constipation.  Constitutional:   Patient denies night sweats, weight loss, fatigue, and fever.  Skin:   Patient denies skin rash/ lesion and itching.  Eyes:   Patient denies blurred vision and double vision.  Ears/ Nose/ Throat:   Patient denies sore throat and sinus problems.  Hematologic/Lymphatic:   Patient denies swollen glands and easy bruising.  Cardiovascular:   Patient denies leg swelling and chest pains.  Respiratory:   Patient denies cough and shortness of breath.  Endocrine:   Patient denies excessive thirst.  Musculoskeletal:   Patient denies back pain and joint pain.  Neurological:   Patient denies headaches and dizziness.  Psychologic:   Patient denies depression and anxiety.   VITAL SIGNS:      05/04/2022 04:08 PM  BP 134/72 mmHg  Pulse 67 /min  Temperature 97.1 F / 36.1 C   MULTI-SYSTEM PHYSICAL EXAMINATION:    Constitutional: Well-nourished. No physical deformities. Normally developed. Good grooming.  Neurologic / Psychiatric: Oriented to time, oriented to place, oriented to person. No depression, no anxiety, no agitation.  Musculoskeletal: Requires a wheelchair     Complexity of Data:  Records Review:   Previous Patient Records  X-Ray Review:  KUB: Reviewed Films. Discussed With Patient.     PROCEDURES:         KUB - [id number]  A single view of the abdomen is obtained.      . Patient confirmed No Neulasta OnPro Device.   There is an 8 mm calcification in the lower confines of the right renal shadow. There are no identifiable calcifications seen within the left renal shadow or along the expected course of either ureter. No bladder calcifications are appreciated. Lumbar spine hardware noted with no other bony abnormalities. No abnormal bowel gas patterns are appreciated.   ASSESSMENT:      ICD-10 Details  1 GU:   Renal calculus - N20.0 Right, Chronic, Stable  2   BPH w/LUTS - N40.1   3   Nocturia - R35.1    PLAN:           Schedule Return Visit/Planned Activity: 1 Year - Office Visit, Follow up MD  Return Visit/Planned Activity: Next Available Appointment - Schedule Surgery          Document Letter(s):  Created for Patient: Clinical Summary         Notes:  The risks, benefits and alternatives of RIGHT ESWL was discussed with the patient. I described the risks which include arrhythmia, kidney contusion, kidney hemorrhage, need for transfusion, back discomfort, flank ecchymosis, flank abrasion, inability to break up stone, inability to pass stone fragments, Steinstrasse, infection associated with obstructing stones, need for different surgical procedure and possible need for repeat shockwave lithotripsy. The patient voices understanding and wishes to proceed.

## 2022-05-29 ENCOUNTER — Ambulatory Visit (HOSPITAL_COMMUNITY): Payer: Medicare HMO

## 2022-05-29 ENCOUNTER — Ambulatory Visit (HOSPITAL_BASED_OUTPATIENT_CLINIC_OR_DEPARTMENT_OTHER)
Admission: RE | Admit: 2022-05-29 | Discharge: 2022-05-29 | Disposition: A | Discharge status: Home / Self Care | Payer: Medicare HMO | Attending: Urology | Admitting: Urology

## 2022-05-29 ENCOUNTER — Encounter (HOSPITAL_BASED_OUTPATIENT_CLINIC_OR_DEPARTMENT_OTHER): Payer: Self-pay | Admitting: Urology

## 2022-05-29 ENCOUNTER — Encounter (HOSPITAL_BASED_OUTPATIENT_CLINIC_OR_DEPARTMENT_OTHER)
Admission: RE | Disposition: A | Discharge status: Home / Self Care | Payer: Self-pay | Source: Home / Self Care | Attending: Urology

## 2022-05-29 DIAGNOSIS — I4891 Unspecified atrial fibrillation: Secondary | ICD-10-CM | POA: Insufficient documentation

## 2022-05-29 DIAGNOSIS — Z9049 Acquired absence of other specified parts of digestive tract: Secondary | ICD-10-CM | POA: Diagnosis not present

## 2022-05-29 DIAGNOSIS — Z7901 Long term (current) use of anticoagulants: Secondary | ICD-10-CM | POA: Diagnosis not present

## 2022-05-29 DIAGNOSIS — Z01818 Encounter for other preprocedural examination: Secondary | ICD-10-CM | POA: Diagnosis not present

## 2022-05-29 DIAGNOSIS — N200 Calculus of kidney: Secondary | ICD-10-CM | POA: Diagnosis not present

## 2022-05-29 DIAGNOSIS — Z981 Arthrodesis status: Secondary | ICD-10-CM | POA: Diagnosis not present

## 2022-05-29 HISTORY — PX: EXTRACORPOREAL SHOCK WAVE LITHOTRIPSY: SHX1557

## 2022-05-29 LAB — PROTIME-INR
INR: 1.1 (ref 0.8–1.2)
Prothrombin Time: 14 seconds (ref 11.4–15.2)

## 2022-05-29 LAB — APTT: aPTT: 32 seconds (ref 24–36)

## 2022-05-29 SURGERY — LITHOTRIPSY, ESWL
Anesthesia: LOCAL | Laterality: Right

## 2022-05-29 MED ORDER — SODIUM CHLORIDE 0.9% FLUSH: 3.0000 mL | Freq: Two times a day (BID) | INTRAVENOUS | Status: DC

## 2022-05-29 MED ORDER — DIAZEPAM 5 MG PO TABS
ORAL_TABLET | ORAL | Status: AC
  Filled 2022-05-29: qty 2

## 2022-05-29 MED ORDER — DIPHENHYDRAMINE HCL 25 MG PO CAPS
25.0000 mg | ORAL_CAPSULE | ORAL | Status: AC
  Administered 2022-05-29: 25 mg via ORAL

## 2022-05-29 MED ORDER — CIPROFLOXACIN HCL 500 MG PO TABS
500.0000 mg | ORAL_TABLET | ORAL | Status: AC
  Administered 2022-05-29: 500 mg via ORAL

## 2022-05-29 MED ORDER — CIPROFLOXACIN HCL 500 MG PO TABS
ORAL_TABLET | ORAL | Status: AC
  Filled 2022-05-29: qty 1

## 2022-05-29 MED ORDER — DIAZEPAM 5 MG PO TABS
10.0000 mg | ORAL_TABLET | ORAL | Status: AC
  Administered 2022-05-29: 10 mg via ORAL

## 2022-05-29 MED ORDER — SODIUM CHLORIDE 0.9 % IV SOLN: INTRAVENOUS | Status: DC

## 2022-05-29 MED ORDER — DIPHENHYDRAMINE HCL 25 MG PO CAPS
ORAL_CAPSULE | ORAL | Status: AC
  Filled 2022-05-29: qty 1

## 2022-05-29 NOTE — Interval H&P Note (Signed)
History and Physical Interval Note:  05/29/2022 11:56 AM  Kevin [name]  has presented today for surgery, with the diagnosis of RIGHT RENAL STONE.  The various methods of treatment have been discussed with the patient and family. After consideration of risks, benefits and other options for treatment, the patient has consented to  Procedure(s): EXTRACORPOREAL SHOCK WAVE LITHOTRIPSY (ESWL) (Right) as a surgical intervention.  The patient's history has been reviewed, patient examined, no change in status, stable for surgery.  I have reviewed the patient's chart and labs.  Questions were answered to the patient's satisfaction.     [name]

## 2022-05-29 NOTE — Discharge Instructions (Addendum)
Do not resume Xarelto until we get you in in 2 days for an Ultrasound to make sure you don't have any bleeding around the kidney.  Post Anesthesia Home Care Instructions  Activity: Get plenty of rest for the remainder of the day. A responsible adult should stay with you for 24 hours following the procedure.  For the next 24 hours, DO NOT: -Drive a car -Paediatric nurse -Drink alcoholic beverages -Take any medication unless instructed by your physician -Make any legal decisions or sign important papers.  Meals: Start with liquid foods such as gelatin or soup. Progress to regular foods as tolerated. Avoid greasy, spicy, heavy foods. If nausea and/or vomiting occur, drink only clear liquids until the nausea and/or vomiting subsides. Call your physician if vomiting continues.  Special Instructions/Symptoms: Your throat may feel dry or sore from the anesthesia or the breathing tube placed in your throat during surgery. If this causes discomfort, gargle with warm salt water. The discomfort should disappear within 24 hours.  If you had a scopolamine patch placed behind your ear for the management of post- operative nausea and/or vomiting:  1. The medication in the patch is effective for 72 hours, after which it should be removed.  Wrap patch in a tissue and discard in the trash. Wash hands thoroughly with soap and water. 2. You may remove the patch earlier than 72 hours if you experience unpleasant side effects which may include dry mouth, dizziness or visual disturbances. 3. Avoid touching the patch. Wash your hands with soap and water after contact with the patch.

## 2022-05-30 ENCOUNTER — Encounter (HOSPITAL_BASED_OUTPATIENT_CLINIC_OR_DEPARTMENT_OTHER): Payer: Self-pay | Admitting: Urology

## 2022-05-30 DIAGNOSIS — I700 Atherosclerosis of aorta: Secondary | ICD-10-CM | POA: Diagnosis not present

## 2022-05-30 DIAGNOSIS — I480 Paroxysmal atrial fibrillation: Secondary | ICD-10-CM | POA: Diagnosis not present

## 2022-05-30 DIAGNOSIS — D6869 Other thrombophilia: Secondary | ICD-10-CM | POA: Diagnosis not present

## 2022-05-30 DIAGNOSIS — K219 Gastro-esophageal reflux disease without esophagitis: Secondary | ICD-10-CM | POA: Diagnosis not present

## 2022-05-30 DIAGNOSIS — I10 Essential (primary) hypertension: Secondary | ICD-10-CM | POA: Diagnosis not present

## 2022-05-31 DIAGNOSIS — N200 Calculus of kidney: Secondary | ICD-10-CM | POA: Diagnosis not present

## 2022-06-10 DIAGNOSIS — G4733 Obstructive sleep apnea (adult) (pediatric): Secondary | ICD-10-CM | POA: Diagnosis not present

## 2022-06-25 ENCOUNTER — Other Ambulatory Visit: Payer: Self-pay | Admitting: Cardiology

## 2022-06-25 DIAGNOSIS — I4891 Unspecified atrial fibrillation: Secondary | ICD-10-CM

## 2022-06-26 NOTE — Telephone Encounter (Signed)
Xarelto '20mg'$  refill request received. Pt is 76 years old, weight-111.5kg, Crea-0.97 on 09/13/2021, last seen by Melina Copa on 05/12/2022, Diagnosis-, CrCl-102.32m/min; Dose is appropriate based on dosing criteria. Will send in refill to requested pharmacy.

## 2022-07-12 DIAGNOSIS — G4733 Obstructive sleep apnea (adult) (pediatric): Secondary | ICD-10-CM | POA: Diagnosis not present

## 2022-07-14 DIAGNOSIS — N200 Calculus of kidney: Secondary | ICD-10-CM | POA: Diagnosis not present

## 2022-07-25 DIAGNOSIS — R69 Illness, unspecified: Secondary | ICD-10-CM | POA: Diagnosis not present

## 2022-07-25 DIAGNOSIS — M961 Postlaminectomy syndrome, not elsewhere classified: Secondary | ICD-10-CM | POA: Diagnosis not present

## 2022-07-25 DIAGNOSIS — G894 Chronic pain syndrome: Secondary | ICD-10-CM | POA: Diagnosis not present

## 2022-08-02 DIAGNOSIS — K219 Gastro-esophageal reflux disease without esophagitis: Secondary | ICD-10-CM | POA: Diagnosis not present

## 2022-08-02 DIAGNOSIS — I480 Paroxysmal atrial fibrillation: Secondary | ICD-10-CM | POA: Diagnosis not present

## 2022-08-02 DIAGNOSIS — I10 Essential (primary) hypertension: Secondary | ICD-10-CM | POA: Diagnosis not present

## 2022-08-02 DIAGNOSIS — I2510 Atherosclerotic heart disease of native coronary artery without angina pectoris: Secondary | ICD-10-CM | POA: Diagnosis not present

## 2022-08-02 DIAGNOSIS — E1142 Type 2 diabetes mellitus with diabetic polyneuropathy: Secondary | ICD-10-CM | POA: Diagnosis not present

## 2022-08-02 DIAGNOSIS — N182 Chronic kidney disease, stage 2 (mild): Secondary | ICD-10-CM | POA: Diagnosis not present

## 2022-08-02 DIAGNOSIS — N401 Enlarged prostate with lower urinary tract symptoms: Secondary | ICD-10-CM | POA: Diagnosis not present

## 2022-08-07 DIAGNOSIS — R300 Dysuria: Secondary | ICD-10-CM | POA: Diagnosis not present

## 2022-08-07 DIAGNOSIS — R310 Gross hematuria: Secondary | ICD-10-CM | POA: Diagnosis not present

## 2022-08-12 DIAGNOSIS — G4733 Obstructive sleep apnea (adult) (pediatric): Secondary | ICD-10-CM | POA: Diagnosis not present

## 2022-08-16 DIAGNOSIS — G4733 Obstructive sleep apnea (adult) (pediatric): Secondary | ICD-10-CM | POA: Diagnosis not present

## 2022-08-22 DIAGNOSIS — G4730 Sleep apnea, unspecified: Secondary | ICD-10-CM | POA: Diagnosis not present

## 2022-08-22 DIAGNOSIS — H02831 Dermatochalasis of right upper eyelid: Secondary | ICD-10-CM | POA: Diagnosis not present

## 2022-08-22 DIAGNOSIS — H04123 Dry eye syndrome of bilateral lacrimal glands: Secondary | ICD-10-CM | POA: Diagnosis not present

## 2022-08-22 DIAGNOSIS — D492 Neoplasm of unspecified behavior of bone, soft tissue, and skin: Secondary | ICD-10-CM | POA: Diagnosis not present

## 2022-08-22 DIAGNOSIS — Z961 Presence of intraocular lens: Secondary | ICD-10-CM | POA: Diagnosis not present

## 2022-08-22 DIAGNOSIS — E119 Type 2 diabetes mellitus without complications: Secondary | ICD-10-CM | POA: Diagnosis not present

## 2022-08-22 DIAGNOSIS — H02834 Dermatochalasis of left upper eyelid: Secondary | ICD-10-CM | POA: Diagnosis not present

## 2022-08-22 DIAGNOSIS — H26491 Other secondary cataract, right eye: Secondary | ICD-10-CM | POA: Diagnosis not present

## 2022-09-11 DIAGNOSIS — N182 Chronic kidney disease, stage 2 (mild): Secondary | ICD-10-CM | POA: Diagnosis not present

## 2022-09-11 DIAGNOSIS — K219 Gastro-esophageal reflux disease without esophagitis: Secondary | ICD-10-CM | POA: Diagnosis not present

## 2022-09-11 DIAGNOSIS — N401 Enlarged prostate with lower urinary tract symptoms: Secondary | ICD-10-CM | POA: Diagnosis not present

## 2022-09-11 DIAGNOSIS — I480 Paroxysmal atrial fibrillation: Secondary | ICD-10-CM | POA: Diagnosis not present

## 2022-09-11 DIAGNOSIS — I2510 Atherosclerotic heart disease of native coronary artery without angina pectoris: Secondary | ICD-10-CM | POA: Diagnosis not present

## 2022-09-11 DIAGNOSIS — E1142 Type 2 diabetes mellitus with diabetic polyneuropathy: Secondary | ICD-10-CM | POA: Diagnosis not present

## 2022-09-11 DIAGNOSIS — I10 Essential (primary) hypertension: Secondary | ICD-10-CM | POA: Diagnosis not present

## 2022-09-12 DIAGNOSIS — G4733 Obstructive sleep apnea (adult) (pediatric): Secondary | ICD-10-CM | POA: Diagnosis not present

## 2022-10-01 ENCOUNTER — Other Ambulatory Visit: Payer: Self-pay | Admitting: Physician Assistant

## 2022-10-04 DIAGNOSIS — E785 Hyperlipidemia, unspecified: Secondary | ICD-10-CM | POA: Diagnosis not present

## 2022-10-04 DIAGNOSIS — Z794 Long term (current) use of insulin: Secondary | ICD-10-CM | POA: Diagnosis not present

## 2022-10-04 DIAGNOSIS — E669 Obesity, unspecified: Secondary | ICD-10-CM | POA: Diagnosis not present

## 2022-10-04 DIAGNOSIS — M1990 Unspecified osteoarthritis, unspecified site: Secondary | ICD-10-CM | POA: Diagnosis not present

## 2022-10-04 DIAGNOSIS — E876 Hypokalemia: Secondary | ICD-10-CM | POA: Diagnosis not present

## 2022-10-04 DIAGNOSIS — K219 Gastro-esophageal reflux disease without esophagitis: Secondary | ICD-10-CM | POA: Diagnosis not present

## 2022-10-04 DIAGNOSIS — D6869 Other thrombophilia: Secondary | ICD-10-CM | POA: Diagnosis not present

## 2022-10-04 DIAGNOSIS — G4733 Obstructive sleep apnea (adult) (pediatric): Secondary | ICD-10-CM | POA: Diagnosis not present

## 2022-10-04 DIAGNOSIS — I4891 Unspecified atrial fibrillation: Secondary | ICD-10-CM | POA: Diagnosis not present

## 2022-10-04 DIAGNOSIS — E1142 Type 2 diabetes mellitus with diabetic polyneuropathy: Secondary | ICD-10-CM | POA: Diagnosis not present

## 2022-10-04 DIAGNOSIS — I2510 Atherosclerotic heart disease of native coronary artery without angina pectoris: Secondary | ICD-10-CM | POA: Diagnosis not present

## 2022-10-04 DIAGNOSIS — I10 Essential (primary) hypertension: Secondary | ICD-10-CM | POA: Diagnosis not present

## 2022-10-19 DIAGNOSIS — G894 Chronic pain syndrome: Secondary | ICD-10-CM | POA: Diagnosis not present

## 2022-10-19 DIAGNOSIS — R69 Illness, unspecified: Secondary | ICD-10-CM | POA: Diagnosis not present

## 2022-10-19 DIAGNOSIS — F1120 Opioid dependence, uncomplicated: Secondary | ICD-10-CM | POA: Diagnosis not present

## 2022-10-19 DIAGNOSIS — M961 Postlaminectomy syndrome, not elsewhere classified: Secondary | ICD-10-CM | POA: Diagnosis not present

## 2022-10-23 DIAGNOSIS — R531 Weakness: Secondary | ICD-10-CM | POA: Diagnosis not present

## 2022-10-23 DIAGNOSIS — I2510 Atherosclerotic heart disease of native coronary artery without angina pectoris: Secondary | ICD-10-CM | POA: Diagnosis not present

## 2022-10-23 DIAGNOSIS — Z794 Long term (current) use of insulin: Secondary | ICD-10-CM | POA: Diagnosis not present

## 2022-10-23 DIAGNOSIS — R2689 Other abnormalities of gait and mobility: Secondary | ICD-10-CM | POA: Diagnosis not present

## 2022-10-23 DIAGNOSIS — I959 Hypotension, unspecified: Secondary | ICD-10-CM | POA: Diagnosis not present

## 2022-10-23 DIAGNOSIS — E1165 Type 2 diabetes mellitus with hyperglycemia: Secondary | ICD-10-CM | POA: Diagnosis not present

## 2022-10-23 DIAGNOSIS — E1142 Type 2 diabetes mellitus with diabetic polyneuropathy: Secondary | ICD-10-CM | POA: Diagnosis not present

## 2022-11-03 ENCOUNTER — Encounter: Payer: Self-pay | Admitting: Physical Medicine & Rehabilitation

## 2022-12-04 DIAGNOSIS — G4733 Obstructive sleep apnea (adult) (pediatric): Secondary | ICD-10-CM | POA: Diagnosis not present

## 2022-12-08 ENCOUNTER — Encounter: Payer: Medicare HMO | Admitting: Physical Medicine & Rehabilitation

## 2022-12-12 DIAGNOSIS — I10 Essential (primary) hypertension: Secondary | ICD-10-CM | POA: Diagnosis not present

## 2022-12-12 DIAGNOSIS — E1142 Type 2 diabetes mellitus with diabetic polyneuropathy: Secondary | ICD-10-CM | POA: Diagnosis not present

## 2022-12-12 DIAGNOSIS — N401 Enlarged prostate with lower urinary tract symptoms: Secondary | ICD-10-CM | POA: Diagnosis not present

## 2022-12-12 DIAGNOSIS — N182 Chronic kidney disease, stage 2 (mild): Secondary | ICD-10-CM | POA: Diagnosis not present

## 2022-12-12 DIAGNOSIS — I2510 Atherosclerotic heart disease of native coronary artery without angina pectoris: Secondary | ICD-10-CM | POA: Diagnosis not present

## 2022-12-12 DIAGNOSIS — I480 Paroxysmal atrial fibrillation: Secondary | ICD-10-CM | POA: Diagnosis not present

## 2022-12-12 DIAGNOSIS — K219 Gastro-esophageal reflux disease without esophagitis: Secondary | ICD-10-CM | POA: Diagnosis not present

## 2022-12-23 ENCOUNTER — Other Ambulatory Visit: Payer: Self-pay | Admitting: Cardiology

## 2022-12-23 DIAGNOSIS — I4891 Unspecified atrial fibrillation: Secondary | ICD-10-CM

## 2022-12-29 ENCOUNTER — Other Ambulatory Visit: Payer: Self-pay | Admitting: Physician Assistant

## 2023-01-01 NOTE — Therapy (Unsigned)
OUTPATIENT PHYSICAL THERAPY LOWER EXTREMITY EVALUATION   Patient Name: Kevin [name] MRN: [id number] DOB:1946-02-01, 77 y.o., male Today's Date: 01/01/2023  END OF SESSION:   Past Medical History:  Diagnosis Date   Diabetes mellitus    Fall    FUO (fever of unknown origin) 01/19/2016   GERD (gastroesophageal reflux disease)    History of hiatal hernia    Hypertension    Occasional tremors    Prostatitis 01/19/2016   Sleep apnea    . Last test was before 2007 , not sure name of test site. uses CPAP   Syncope    Wears glasses    Past Surgical History:  Procedure Laterality Date   BACK SURGERY     CHOLECYSTECTOMY N/A 06/15/2016   Procedure: LAPAROSCOPIC CHOLECYSTECTOMY WITH INTRAOPERATIVE CHOLANGIOGRAM;  Surgeon: Armandina Gemma, MD;  Location: [facility];  Service: General;  Laterality: N/A;   Colonoscopy  2011   EXTRACORPOREAL SHOCK WAVE LITHOTRIPSY Right 05/29/2022   Procedure: EXTRACORPOREAL SHOCK WAVE LITHOTRIPSY (ESWL);  Surgeon: [name], MD;  Location: [facility];  Service: Urology;  Laterality: Right;   EYE SURGERY     Cataract 2013   HIATAL HERNIA REPAIR     x2   KNEE ARTHROSCOPY     bil   LUMBAR FUSION  2013   PATELLA FRACTURE SURGERY     Patient Active Problem List   Diagnosis Date Noted   Aortic atherosclerosis ([place]) 05/09/2022   New onset atrial fibrillation ([place]) 02/28/2020   Sepsis due to undetermined organism ([place]) 67/67/2094   Acute metabolic encephalopathy 70/96/2836   Chronic pain 02/26/2020   Cholelithiasis with chronic cholecystitis 06/15/2016   Cholelithiasis with cholecystitis 06/14/2016   FUO (fever of unknown origin) 01/19/2016   Prostatitis 01/19/2016   Pseudomonas aeruginosa infection 01/21/2015   Vertebral osteomyelitis ([place]) 04/24/2013   Diskitis 01/23/2013   Swelling of arm 01/23/2013   Encephalopathy acute 12/28/2012   UTI (lower urinary tract infection) 12/28/2012   Spondylolisthesis of lumbar region 08/30/2012   COUGH  VARIANT ASTHMA 10/09/2008   Essential hypertension 09/12/2008   Sleep apnea 09/12/2008   COUGH 09/12/2008   ALLERGIC RHINITIS 09/04/2008   Uncontrolled type 2 diabetes mellitus with hyperglycemia, with long-term current use of insulin ([place]) 09/03/2008   Dyslipidemia 09/03/2008   Class 1 obesity with body mass index (BMI) of 33.0 to 33.9 in adult 09/03/2008   ANEURYSM, 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  Rationale for Evaluation and Treatment: Rehabilitation  ONSET DATE: ***   SUBJECTIVE:  SUBJECTIVE STATEMENT: ***  PERTINENT HISTORY: ***  PAIN:  Are you having pain? Yes:  NPRS scale: ***/10 Pain location: *** Pain description: *** Aggravating factors: *** Relieving factors: ***  PRECAUTIONS: {Therapy precautions:24002}  WEIGHT BEARING RESTRICTIONS: No  FALLS:  Has patient fallen in last 6 months? {fallsyesno:27318}  LIVING ENVIRONMENT: Lives with: {[facility] lives with:25569::"lives with their family"} Lives in: {Lives in:25570} Stairs: {opstairs:27293} Has following equipment at home: {Assistive devices:23999}  PLOF: Independent  PATIENT GOALS: ***   OBJECTIVE:  PATIENT SURVEYS:  FOTO ***% functional status  COGNITION: Overall cognitive status: Within functional limits for tasks assessed     SENSATION: WFL  MUSCLE LENGTH: ***  POSTURE:   ***  PALPATION:  ***  LOWER EXTREMITY MMT:  MMT Right eval Left eval  Hip flexion    Hip extension  Hip abduction    Hip adduction    Hip internal rotation    Hip external rotation    Knee flexion    Knee extension    Ankle dorsiflexion    Ankle plantarflexion    Ankle inversion    Ankle eversion     (Blank rows = not tested)  LOWER EXTREMITY SPECIAL TESTS:  {LEspecialtests:26242}  FUNCTIONAL TESTS:  {Functional  tests:24029}  GAIT: Distance walked: *** Assistive device utilized: {Assistive devices:23999} Level of assistance: {Levels of assistance:24026} Comments: ***   TODAY'S TREATMENT:      [facility] Adult PT Treatment:                                                DATE: 01/02/2022 Therapeutic Exercise: ***  PATIENT EDUCATION:  Education details: Exam findings, POC, HEP Person educated: Patient Education method: Explanation, Demonstration, Tactile cues, Verbal cues, and Handouts Education comprehension: verbalized understanding, returned demonstration, verbal cues required, tactile cues required, and needs further education  HOME EXERCISE PROGRAM: ***   ASSESSMENT: CLINICAL IMPRESSION: Patient is a 77 y.o. male who was seen today for physical therapy evaluation and treatment for ***.   OBJECTIVE IMPAIRMENTS: {opptimpairments:25111}.   ACTIVITY LIMITATIONS: {activitylimitations:27494}  PARTICIPATION LIMITATIONS: {participationrestrictions:25113}  PERSONAL FACTORS: {Personal factors:25162} are also affecting patient's functional outcome.   REHAB POTENTIAL: {rehabpotential:25112}  CLINICAL DECISION MAKING: {clinical decision making:25114}  EVALUATION COMPLEXITY: {Evaluation complexity:25115}   GOALS: Goals reviewed with patient? Yes  SHORT TERM GOALS: Target date: ***  Patient will be I with initial HEP in order to progress with therapy. Baseline: HEP provided at eval Goal status: INITIAL  2.  PT will review FOTO with patient by 3rd visit in order to understand expected progress and outcome with therapy. Baseline: FOTO assessed at eval Goal status: INITIAL  3.  *** Baseline:  Goal status: INITIAL  LONG TERM GOALS: Target date: ***  Patient will be I with final HEP to maintain progress from PT. Baseline: HEP provided at eval Goal status: INITIAL  2.  Patient will report >/= ***% status on FOTO to indicate improved functional ability. Baseline: % functional  status Goal status: INITIAL  3.  *** Baseline:  Goal status: INITIAL  4.  *** Baseline:  Goal status: INITIAL   PLAN: PT FREQUENCY: {rehab frequency:25116}  PT DURATION: {rehab duration:25117}  PLANNED INTERVENTIONS: {rehab planned interventions:25118::"Therapeutic exercises","Therapeutic activity","Neuromuscular re-education","Balance training","Gait training","Patient/Family education","Self Care","Joint mobilization"}  PLAN FOR NEXT SESSION: Review HEP and progress PRN, ***   Hilda Blades, PT, DPT, LAT, ATC 01/01/23  1:04 PM Phone: [phone] Fax: [phone]

## 2023-01-02 ENCOUNTER — Other Ambulatory Visit: Payer: Self-pay

## 2023-01-02 ENCOUNTER — Ambulatory Visit: Payer: Medicare HMO | Attending: Internal Medicine | Admitting: Physical Therapy

## 2023-01-02 ENCOUNTER — Encounter: Payer: Self-pay | Admitting: Physical Therapy

## 2023-01-02 DIAGNOSIS — M6281 Muscle weakness (generalized): Secondary | ICD-10-CM | POA: Diagnosis not present

## 2023-01-02 DIAGNOSIS — M5459 Other low back pain: Secondary | ICD-10-CM | POA: Diagnosis not present

## 2023-01-02 DIAGNOSIS — R2689 Other abnormalities of gait and mobility: Secondary | ICD-10-CM | POA: Diagnosis not present

## 2023-01-02 NOTE — Patient Instructions (Signed)
Access Code: [email or web address] URL: [email or web address] Date: 01/02/2023 Prepared by: Hilda Blades  Exercises - Sit to Stand with Armchair  - 3 x daily - 5-10 reps - Heel Raises with Counter Support  - 2 x daily - 5-10 reps - Standing Hip Abduction with Counter Support  - 2 x daily - 04-1009 reps - Standing March with Counter Support  - 2 x daily - 5-10 reps - Narrow Stance with Counter Support  - 2 x daily - 3 sets - 10 seconds hold

## 2023-01-04 DIAGNOSIS — R69 Illness, unspecified: Secondary | ICD-10-CM | POA: Diagnosis not present

## 2023-01-04 DIAGNOSIS — G4733 Obstructive sleep apnea (adult) (pediatric): Secondary | ICD-10-CM | POA: Diagnosis not present

## 2023-01-04 DIAGNOSIS — F1120 Opioid dependence, uncomplicated: Secondary | ICD-10-CM | POA: Diagnosis not present

## 2023-01-04 DIAGNOSIS — M5416 Radiculopathy, lumbar region: Secondary | ICD-10-CM | POA: Diagnosis not present

## 2023-01-04 DIAGNOSIS — G894 Chronic pain syndrome: Secondary | ICD-10-CM | POA: Diagnosis not present

## 2023-01-04 DIAGNOSIS — Z6831 Body mass index (BMI) 31.0-31.9, adult: Secondary | ICD-10-CM | POA: Diagnosis not present

## 2023-01-10 DIAGNOSIS — Z1331 Encounter for screening for depression: Secondary | ICD-10-CM | POA: Diagnosis not present

## 2023-01-10 DIAGNOSIS — D6869 Other thrombophilia: Secondary | ICD-10-CM | POA: Diagnosis not present

## 2023-01-10 DIAGNOSIS — G4733 Obstructive sleep apnea (adult) (pediatric): Secondary | ICD-10-CM | POA: Diagnosis not present

## 2023-01-10 DIAGNOSIS — I700 Atherosclerosis of aorta: Secondary | ICD-10-CM | POA: Diagnosis not present

## 2023-01-10 DIAGNOSIS — I10 Essential (primary) hypertension: Secondary | ICD-10-CM | POA: Diagnosis not present

## 2023-01-10 DIAGNOSIS — Z0000 Encounter for general adult medical examination without abnormal findings: Secondary | ICD-10-CM | POA: Diagnosis not present

## 2023-01-10 DIAGNOSIS — I480 Paroxysmal atrial fibrillation: Secondary | ICD-10-CM | POA: Diagnosis not present

## 2023-01-10 DIAGNOSIS — N400 Enlarged prostate without lower urinary tract symptoms: Secondary | ICD-10-CM | POA: Diagnosis not present

## 2023-01-10 DIAGNOSIS — K219 Gastro-esophageal reflux disease without esophagitis: Secondary | ICD-10-CM | POA: Diagnosis not present

## 2023-01-10 DIAGNOSIS — E1122 Type 2 diabetes mellitus with diabetic chronic kidney disease: Secondary | ICD-10-CM | POA: Diagnosis not present

## 2023-01-10 DIAGNOSIS — M792 Neuralgia and neuritis, unspecified: Secondary | ICD-10-CM | POA: Diagnosis not present

## 2023-01-10 DIAGNOSIS — R2689 Other abnormalities of gait and mobility: Secondary | ICD-10-CM | POA: Diagnosis not present

## 2023-01-11 ENCOUNTER — Ambulatory Visit: Payer: Medicare HMO | Admitting: Physical Therapy

## 2023-01-16 NOTE — Therapy (Signed)
OUTPATIENT PHYSICAL THERAPY TREATMENT NOTE   Patient Name: Kevin [name] MRN: [id number] DOB:April 07, 1946, 77 y.o., male Today's Date: 01/18/2023   PCP: Lavone Orn, MD REFERRING PROVIDER: Delrae Rend, MD   END OF SESSION:   PT End of Session - 01/18/23 1537     Visit Number 2    Number of Visits 9    Date for PT Re-Evaluation 02/27/23    Authorization Type [employer] MCR    Progress Note Due on Visit 10    PT Start Time 8366    PT Stop Time 2947    PT Time Calculation (min) 45 min    Activity Tolerance Patient tolerated treatment well    Behavior During Therapy [facility] for tasks assessed/performed             Past Medical History:  Diagnosis Date   Diabetes mellitus    Fall    FUO (fever of unknown origin) 01/19/2016   GERD (gastroesophageal reflux disease)    History of hiatal hernia    Hypertension    Occasional tremors    Prostatitis 01/19/2016   Sleep apnea    . Last test was before 2007 , not sure name of test site. uses CPAP   Syncope    Wears glasses    Past Surgical History:  Procedure Laterality Date   BACK SURGERY     CHOLECYSTECTOMY N/A 06/15/2016   Procedure: LAPAROSCOPIC CHOLECYSTECTOMY WITH INTRAOPERATIVE CHOLANGIOGRAM;  Surgeon: Armandina Gemma, MD;  Location: [facility];  Service: General;  Laterality: N/A;   Colonoscopy  2011   EXTRACORPOREAL SHOCK WAVE LITHOTRIPSY Right 05/29/2022   Procedure: EXTRACORPOREAL SHOCK WAVE LITHOTRIPSY (ESWL);  Surgeon: [name], MD;  Location: [facility];  Service: Urology;  Laterality: Right;   EYE SURGERY     Cataract 2013   HIATAL HERNIA REPAIR     x2   KNEE ARTHROSCOPY     bil   LUMBAR FUSION  2013   PATELLA FRACTURE SURGERY     Patient Active Problem List   Diagnosis Date Noted   Aortic atherosclerosis ([place]) 05/09/2022   New onset atrial fibrillation ([place]) 02/28/2020   Sepsis due to undetermined organism ([place]) 65/46/5035   Acute metabolic encephalopathy 46/56/8127   Chronic pain 02/26/2020    Cholelithiasis with chronic cholecystitis 06/15/2016   Cholelithiasis with cholecystitis 06/14/2016   FUO (fever of unknown origin) 01/19/2016   Prostatitis 01/19/2016   Pseudomonas aeruginosa infection 01/21/2015   Vertebral osteomyelitis ([place]) 04/24/2013   Diskitis 01/23/2013   Swelling of arm 01/23/2013   Encephalopathy acute 12/28/2012   UTI (lower urinary tract infection) 12/28/2012   Spondylolisthesis of lumbar region 08/30/2012   COUGH VARIANT ASTHMA 10/09/2008   Essential hypertension 09/12/2008   Sleep apnea 09/12/2008   COUGH 09/12/2008   ALLERGIC RHINITIS 09/04/2008   Uncontrolled type 2 diabetes mellitus with hyperglycemia, with long-term current use of insulin ([place]) 09/03/2008   Dyslipidemia 09/03/2008   Class 1 obesity with body mass index (BMI) of 33.0 to 33.9 in adult 09/03/2008   ANEURYSM, THORACIC AORTIC 09/03/2008   ARTHRITIS 09/03/2008    REFERRING DIAG: Balance problem, Weakness   THERAPY DIAG:  Muscle weakness (generalized)  Other low back pain  Other abnormalities of gait and mobility  Rationale for Evaluation and Treatment Rehabilitation  PERTINENT HISTORY: See PMH above   PRECAUTIONS: Fall    SUBJECTIVE:          SUBJECTIVE STATEMENT:  Patient reports he is doing well, he has not been able  to do his exercises as consistently.  PAIN:  Are you having pain? No  NPRS scale: 0/10 Pain location: Low back Pain description:  Aggravating factors: Walking, standing Relieving factors: Medication   OBJECTIVE: (objective measures completed at initial evaluation unless otherwise dated) PATIENT SURVEYS:  FOTO 44 % functional status  POSTURE:  Rounded shoulder and forward head, trunk flexion with standing,    LOWER EXTREMITY MMT:   MMT Right eval Left eval  Hip flexion 4- 4  Hip extension      Hip abduction 3- 3  Knee flexion 4 5  Knee extension 4+ 5  Ankle dorsiflexion 5 5  Ankle plantarflexion      Ankle inversion      Ankle eversion        (Blank rows = not tested)   FUNCTIONAL TESTS:  5 times sit to stand: 28 seconds using bilateral UE support from chair armrest 2 minute walk test: 110 ft using [facility]   Patient demonstrates inability to maintain tandem stance, increased sway and occasional UE support with romberg stance   Sit to stand: patient requires BUE assist on armrests to stand from chair   GAIT: Distance walked: 130 ft Assistive device utilized: Single point cane Level of assistance: SBA Comments: forward trunk lean, bilateral toe out, antalgic on right     TODAY'S TREATMENT:      [facility] Adult PT Treatment:                                                DATE: 01/18/2022 Therapeutic Exercise: NuStep L5 x 6 min with UE/LE while taking subjective LAQ with 5# 2 x 10 each Seated clamshell with red 2 x 10 LAQ with red x 10 each Seated alternating march with red x 10 each Standing heel raises x 10 Standing alternating march x 10 each Standing hip abduction x 5 each Sit to stand using UE to stand and eccentric lowering without UE support x 5 Romberg stance on Airex 5  x 15 sec   [facility] Adult PT Treatment:                                                DATE: 01/02/2022 Therapeutic Exercise: Sit to stand using UE to stand and eccentric lowering without UE support 2 x 5 Standing heel raises x 10 Standing hip abduction x 5 each Standing march x 10 Romberg stance 2 x 10 sec   PATIENT EDUCATION:  Education details: HEP update Person educated: Patient Education method: Explanation, Demonstration, Tactile cues, Verbal cues, and Handouts Education comprehension: verbalized understanding, returned demonstration, verbal cues required, tactile cues required, and needs further education   HOME EXERCISE PROGRAM: Access Code: [email or web address]      ASSESSMENT: CLINICAL IMPRESSION: Patient tolerated therapy well with no adverse effects. Therapy focused on progressing LE strengthening and standing stability. He does require  frequent rest breaks with exercises and reports fatigue, stating he was having some dizziness post session. Provided him some seated exercises for hip and leg strengthening. He does require cueing for posture with standing exercises. Patient would benefit from continued skilled PT to progress mobility and strength to maximize functional ability.     OBJECTIVE IMPAIRMENTS: Abnormal gait, decreased activity  tolerance, decreased balance, decreased endurance, difficulty walking, decreased strength, postural dysfunction, and pain.    ACTIVITY LIMITATIONS: carrying, lifting, standing, squatting, stairs, transfers, dressing, hygiene/grooming, and locomotion level   PARTICIPATION LIMITATIONS: meal prep, cleaning, laundry, driving, shopping, community activity, and yard work   PERSONAL FACTORS: Fitness, Past/current experiences, Time since onset of injury/illness/exacerbation, and 3+ comorbidities: see PMH above  are also affecting patient's functional outcome.      GOALS: Goals reviewed with patient? Yes   SHORT TERM GOALS: Target date: 01/30/2023   Patient will be I with initial HEP in order to progress with therapy. Baseline: HEP provided at eval Goal status: INITIAL   2.  PT will review FOTO with patient by 3rd visit in order to understand expected progress and outcome with therapy. Baseline: FOTO assessed at eval Goal status: INITIAL   3.  Patient will be able to perform sit to stand without UE assist from a standard chair in order to indicate improved strength and mobility Baseline: requires UE support to stand from chair Goal status: INITIAL   LONG TERM GOALS: Target date: 02/27/2023   Patient will be I with final HEP to maintain progress from PT. Baseline: HEP provided at eval Goal status: INITIAL   2.  Patient will report >/= 51% status on FOTO to indicate improved functional ability. Baseline: 44% functional status Goal status: INITIAL   3.  Patient will perform 2MWT >/= 200 ft  using LRAD in order to improve household ambulation and performing household tasks Baseline: 110 ft using SPC Goal status: INITIAL   4.  Patient will demonstrate 5xSTS </= 20 seconds without UE assist to indicate a reduced fall risk and improved mobility Baseline: 28 second using BUE assist Goal status: INITIAL     PLAN: PT FREQUENCY: 1x/week   PT DURATION: 8 weeks   PLANNED INTERVENTIONS: Therapeutic exercises, Therapeutic activity, Neuromuscular re-education, Balance training, Gait training, Patient/Family education, Self Care, Joint mobilization, Stair training, Aquatic Therapy, Dry Needling, Manual therapy, and Re-evaluation   PLAN FOR NEXT SESSION: Review HEP and progress PRN, progress LE strength and balance training as tolerated, workload capacity and endurance training   Hilda Blades, PT, DPT, LAT, ATC 01/18/23  4:34 PM Phone: [phone] Fax: [phone]

## 2023-01-18 ENCOUNTER — Encounter: Payer: Self-pay | Admitting: Physical Therapy

## 2023-01-18 ENCOUNTER — Other Ambulatory Visit: Payer: Self-pay

## 2023-01-18 ENCOUNTER — Ambulatory Visit: Payer: Medicare HMO | Admitting: Physical Therapy

## 2023-01-18 DIAGNOSIS — M5459 Other low back pain: Secondary | ICD-10-CM

## 2023-01-18 DIAGNOSIS — N200 Calculus of kidney: Secondary | ICD-10-CM | POA: Diagnosis not present

## 2023-01-18 DIAGNOSIS — M6281 Muscle weakness (generalized): Secondary | ICD-10-CM

## 2023-01-18 DIAGNOSIS — R2689 Other abnormalities of gait and mobility: Secondary | ICD-10-CM | POA: Diagnosis not present

## 2023-01-18 DIAGNOSIS — R351 Nocturia: Secondary | ICD-10-CM | POA: Diagnosis not present

## 2023-01-18 DIAGNOSIS — N401 Enlarged prostate with lower urinary tract symptoms: Secondary | ICD-10-CM | POA: Diagnosis not present

## 2023-01-18 NOTE — Patient Instructions (Signed)
Access Code: [email or web address] URL: [email or web address] Date: 01/18/2023 Prepared by: Hilda Blades  Exercises - Sit to Stand with Armchair  - 3 x daily - 5-10 reps - Heel Raises with Counter Support  - 2 x daily - 5-10 reps - Standing Hip Abduction with Counter Support  - 2 x daily - 5-10 reps - Standing March with Counter Support  - 2 x daily - 5-10 reps - Narrow Stance with Counter Support  - 2 x daily - 3 sets - 10 seconds hold - Seated Hip Abduction with Resistance  - 1 x daily - 2 sets - 10 reps - Seated Knee Extension with Anchored Resistance  - 1 x daily - 2 sets - 10 reps

## 2023-01-23 DIAGNOSIS — I2510 Atherosclerotic heart disease of native coronary artery without angina pectoris: Secondary | ICD-10-CM | POA: Diagnosis not present

## 2023-01-23 DIAGNOSIS — Z794 Long term (current) use of insulin: Secondary | ICD-10-CM | POA: Diagnosis not present

## 2023-01-23 DIAGNOSIS — E1142 Type 2 diabetes mellitus with diabetic polyneuropathy: Secondary | ICD-10-CM | POA: Diagnosis not present

## 2023-01-23 DIAGNOSIS — E1165 Type 2 diabetes mellitus with hyperglycemia: Secondary | ICD-10-CM | POA: Diagnosis not present

## 2023-01-24 ENCOUNTER — Other Ambulatory Visit: Payer: Self-pay

## 2023-01-24 ENCOUNTER — Ambulatory Visit: Payer: Medicare HMO | Admitting: Physical Therapy

## 2023-01-24 ENCOUNTER — Encounter: Payer: Self-pay | Admitting: Physical Therapy

## 2023-01-24 DIAGNOSIS — M5459 Other low back pain: Secondary | ICD-10-CM

## 2023-01-24 DIAGNOSIS — R2689 Other abnormalities of gait and mobility: Secondary | ICD-10-CM

## 2023-01-24 DIAGNOSIS — M6281 Muscle weakness (generalized): Secondary | ICD-10-CM | POA: Diagnosis not present

## 2023-01-24 NOTE — Therapy (Addendum)
OUTPATIENT PHYSICAL THERAPY TREATMENT NOTE  DISCHARGE   Patient Name: Kevin [name] MRN: [id number] DOB:[birth year], 77 y.o., male Today's Date: 01/24/2023   PCP: Lavone Orn, MD REFERRING PROVIDER: Delrae Rend, MD   END OF SESSION:   PT End of Session - 01/24/23 1510     Visit Number 3    Number of Visits 9    Date for PT Re-Evaluation 02/27/23    Authorization Type [employer] MCR    Progress Note Due on Visit 10    PT Start Time [id number]    PT Stop Time 1530    PT Time Calculation (min) 43 min    Activity Tolerance Patient tolerated treatment well    Behavior During Therapy [facility] for tasks assessed/performed              Past Medical History:  Diagnosis Date   Diabetes mellitus    Fall    FUO (fever of unknown origin) 01/19/2016   GERD (gastroesophageal reflux disease)    History of hiatal hernia    Hypertension    Occasional tremors    Prostatitis 01/19/2016   Sleep apnea    . Last test was before 2007 , not sure name of test site. uses CPAP   Syncope    Wears glasses    Past Surgical History:  Procedure Laterality Date   BACK SURGERY     CHOLECYSTECTOMY N/A 06/15/2016   Procedure: LAPAROSCOPIC CHOLECYSTECTOMY WITH INTRAOPERATIVE CHOLANGIOGRAM;  Surgeon: Armandina Gemma, MD;  Location: [facility];  Service: General;  Laterality: N/A;   Colonoscopy  2011   EXTRACORPOREAL SHOCK WAVE LITHOTRIPSY Right 05/29/2022   Procedure: EXTRACORPOREAL SHOCK WAVE LITHOTRIPSY (ESWL);  Surgeon: [name], MD;  Location: [facility];  Service: Urology;  Laterality: Right;   EYE SURGERY     Cataract 2013   HIATAL HERNIA REPAIR     x2   KNEE ARTHROSCOPY     bil   LUMBAR FUSION  2013   PATELLA FRACTURE SURGERY     Patient Active Problem List   Diagnosis Date Noted   Aortic atherosclerosis ([place]) 05/09/2022   New onset atrial fibrillation ([place]) 02/28/2020   Sepsis due to undetermined organism ([place]) 0000000   Acute metabolic encephalopathy 0000000   Chronic pain  02/26/2020   Cholelithiasis with chronic cholecystitis 06/15/2016   Cholelithiasis with cholecystitis 06/14/2016   FUO (fever of unknown origin) 01/19/2016   Prostatitis 01/19/2016   Pseudomonas aeruginosa infection 01/21/2015   Vertebral osteomyelitis ([place]) 04/24/2013   Diskitis 01/23/2013   Swelling of arm 01/23/2013   Encephalopathy acute 12/28/2012   UTI (lower urinary tract infection) 12/28/2012   Spondylolisthesis of lumbar region 08/30/2012   COUGH VARIANT ASTHMA 10/09/2008   Essential hypertension 09/12/2008   Sleep apnea 09/12/2008   COUGH 09/12/2008   ALLERGIC RHINITIS 09/04/2008   Uncontrolled type 2 diabetes mellitus with hyperglycemia, with long-term current use of insulin ([place]) 09/03/2008   Dyslipidemia 09/03/2008   Class 1 obesity with body mass index (BMI) of 33.0 to 33.9 in adult 09/03/2008   ANEURYSM, THORACIC AORTIC 09/03/2008   ARTHRITIS 09/03/2008    REFERRING DIAG: Balance problem, Weakness   THERAPY DIAG:  Muscle weakness (generalized)  Other low back pain  Other abnormalities of gait and mobility  Rationale for Evaluation and Treatment Rehabilitation  PERTINENT HISTORY: See PMH above   PRECAUTIONS: Fall    SUBJECTIVE:          SUBJECTIVE STATEMENT:  Patient reports he is doing well. Still doing  about the same. Has been working on his exercises.  PAIN:  Are you having pain? No  NPRS scale: 0/10 Pain location: Low back Pain description:  Aggravating factors: Walking, standing Relieving factors: Medication   OBJECTIVE: (objective measures completed at initial evaluation unless otherwise dated) PATIENT SURVEYS:  FOTO 44 % functional status  POSTURE:  Rounded shoulder and forward head, trunk flexion with standing,    LOWER EXTREMITY MMT:   MMT Right eval Left eval  Hip flexion 4- 4  Hip extension      Hip abduction 3- 3  Knee flexion 4 5  Knee extension 4+ 5  Ankle dorsiflexion 5 5  Ankle plantarflexion      Ankle inversion       Ankle eversion       (Blank rows = not tested)   FUNCTIONAL TESTS:  5 times sit to stand: 28 seconds using bilateral UE support from chair armrest 2 minute walk test: 110 ft using [facility]   Patient demonstrates inability to maintain tandem stance, increased sway and occasional UE support with romberg stance   Sit to stand: patient requires BUE assist on armrests to stand from chair  01/24/2023: continues to require UE support to stand   GAIT: Distance walked: 130 ft Assistive device utilized: Single point cane Level of assistance: SBA Comments: forward trunk lean, bilateral toe out, antalgic on right     TODAY'S TREATMENT:      [facility] Adult PT Treatment:                                                DATE: 01/24/2022 Therapeutic Exercise: Supine hamstring and piriformis push/pull stretching LTR x 10 Hooklying clamshell with red x 10 Bridge x 5 SLR x 5 each NuStep L5 x 5 min with UE/LE Standing hip abduction x 10 each Standing alternating march x 10 each Standing heel raises x 10   [facility] Adult PT Treatment:                                                DATE: 01/18/2022 Therapeutic Exercise: NuStep L5 x 6 min with UE/LE while taking subjective LAQ with 5# 2 x 10 each Seated clamshell with red 2 x 10 LAQ with red x 10 each Seated alternating march with red x 10 each Standing heel raises x 10 Standing alternating march x 10 each Standing hip abduction x 5 each Sit to stand using UE to stand and eccentric lowering without UE support x 5 Romberg stance on Airex 5  x 15 sec  [facility] Adult PT Treatment:                                                DATE: 01/02/2022 Therapeutic Exercise: Sit to stand using UE to stand and eccentric lowering without UE support 2 x 5 Standing heel raises x 10 Standing hip abduction x 5 each Standing march x 10 Romberg stance 2 x 10 sec   PATIENT EDUCATION:  Education details: HEP Person educated: Patient Education method: Explanation,  Demonstration, Corporate treasurer cues, Verbal cues Education comprehension:  verbalized understanding, returned demonstration, verbal cues required, tactile cues required, and needs further education   HOME EXERCISE PROGRAM: Access Code: [email or web address]      ASSESSMENT: CLINICAL IMPRESSION: Patient tolerated therapy well with no adverse effects. Therapy incorporated some mat based stretching and strengthening this visit, and continued with standing exercises. He continues to have a decreased tolerance for activity and requires frequent and extended rest breaks. He continues to exhibit gross LE strength deficit and difficulty standing from a chair and extended periods of standing or walking. No changes were made to HEP this visit. Patient would benefit from continued skilled PT to progress mobility and strength to maximize functional ability.     OBJECTIVE IMPAIRMENTS: Abnormal gait, decreased activity tolerance, decreased balance, decreased endurance, difficulty walking, decreased strength, postural dysfunction, and pain.    ACTIVITY LIMITATIONS: carrying, lifting, standing, squatting, stairs, transfers, dressing, hygiene/grooming, and locomotion level   PARTICIPATION LIMITATIONS: meal prep, cleaning, laundry, driving, shopping, community activity, and yard work   PERSONAL FACTORS: Fitness, Past/current experiences, Time since onset of injury/illness/exacerbation, and 3+ comorbidities: see PMH above  are also affecting patient's functional outcome.      GOALS: Goals reviewed with patient? Yes   SHORT TERM GOALS: Target date: 01/30/2023   Patient will be I with initial HEP in order to progress with therapy. Baseline: HEP provided at eval Goal status: INITIAL   2.  PT will review FOTO with patient by 3rd visit in order to understand expected progress and outcome with therapy. Baseline: FOTO assessed at eval Goal status: INITIAL   3.  Patient will be able to perform sit to stand without UE assist from a  standard chair in order to indicate improved strength and mobility Baseline: requires UE support to stand from chair Goal status: INITIAL   LONG TERM GOALS: Target date: 02/27/2023   Patient will be I with final HEP to maintain progress from PT. Baseline: HEP provided at eval Goal status: INITIAL   2.  Patient will report >/= 51% status on FOTO to indicate improved functional ability. Baseline: 44% functional status Goal status: INITIAL   3.  Patient will perform 2MWT >/= 200 ft using LRAD in order to improve household ambulation and performing household tasks Baseline: 110 ft using SPC Goal status: INITIAL   4.  Patient will demonstrate 5xSTS </= 20 seconds without UE assist to indicate a reduced fall risk and improved mobility Baseline: 28 second using BUE assist Goal status: INITIAL     PLAN: PT FREQUENCY: 1x/week   PT DURATION: 8 weeks   PLANNED INTERVENTIONS: Therapeutic exercises, Therapeutic activity, Neuromuscular re-education, Balance training, Gait training, Patient/Family education, Self Care, Joint mobilization, Stair training, Aquatic Therapy, Dry Needling, Manual therapy, and Re-evaluation   PLAN FOR NEXT SESSION: Review HEP and progress PRN, progress LE strength and balance training as tolerated, workload capacity and endurance training   Hilda Blades, PT, DPT, LAT, ATC 01/24/23  3:52 PM Phone: [phone] Fax: [phone]     PHYSICAL THERAPY DISCHARGE SUMMARY  Visits from Start of Care: 3  Current functional level related to goals / functional outcomes: See above   Remaining deficits: See above   Education / Equipment: HEP   Patient agrees to discharge. Patient goals were not met. Patient is being discharged due to the patient's request.  Hilda Blades, PT, DPT, LAT, ATC 02/27/23  3:18 PM Phone: [phone] Fax: [phone]

## 2023-02-01 ENCOUNTER — Ambulatory Visit: Payer: Medicare HMO | Admitting: Physical Therapy

## 2023-02-04 DIAGNOSIS — G4733 Obstructive sleep apnea (adult) (pediatric): Secondary | ICD-10-CM | POA: Diagnosis not present

## 2023-03-07 DIAGNOSIS — G8929 Other chronic pain: Secondary | ICD-10-CM | POA: Diagnosis not present

## 2023-03-07 DIAGNOSIS — R5383 Other fatigue: Secondary | ICD-10-CM | POA: Diagnosis not present

## 2023-03-07 DIAGNOSIS — M545 Low back pain, unspecified: Secondary | ICD-10-CM | POA: Diagnosis not present

## 2023-03-07 DIAGNOSIS — Z79899 Other long term (current) drug therapy: Secondary | ICD-10-CM | POA: Diagnosis not present

## 2023-03-07 DIAGNOSIS — D649 Anemia, unspecified: Secondary | ICD-10-CM | POA: Diagnosis not present

## 2023-03-15 DIAGNOSIS — M961 Postlaminectomy syndrome, not elsewhere classified: Secondary | ICD-10-CM | POA: Diagnosis not present

## 2023-03-15 DIAGNOSIS — Z6831 Body mass index (BMI) 31.0-31.9, adult: Secondary | ICD-10-CM | POA: Diagnosis not present

## 2023-03-15 DIAGNOSIS — G894 Chronic pain syndrome: Secondary | ICD-10-CM | POA: Diagnosis not present

## 2023-03-15 DIAGNOSIS — R69 Illness, unspecified: Secondary | ICD-10-CM | POA: Diagnosis not present

## 2023-03-17 DIAGNOSIS — G4733 Obstructive sleep apnea (adult) (pediatric): Secondary | ICD-10-CM | POA: Diagnosis not present

## 2023-03-30 ENCOUNTER — Other Ambulatory Visit: Payer: Self-pay | Admitting: *Deleted

## 2023-03-30 MED ORDER — METOPROLOL TARTRATE 50 MG PO TABS: 50.0000 mg | ORAL_TABLET | Freq: Two times a day (BID) | ORAL | 0 refills | Status: DC

## 2023-04-13 ENCOUNTER — Other Ambulatory Visit: Payer: Self-pay | Admitting: Cardiology

## 2023-04-13 DIAGNOSIS — I4891 Unspecified atrial fibrillation: Secondary | ICD-10-CM

## 2023-04-13 NOTE — Telephone Encounter (Signed)
Pt last saw Ronie Spies, PA 05/12/22, pt is overdue for follow-up with Dr Jens Som, recall in Epic. Msg sent to schedulers to contact pt for follow-up.  Last labs 03/07/23 Creat 1.07 at [facility] per [facility], age 77, weight 111.5kg, CrCl 91.18, based on CrCl pt is on appropriate dosage of Xarelto  QD will refill x 1 since overdue for follow-up.

## 2023-04-17 DIAGNOSIS — G4733 Obstructive sleep apnea (adult) (pediatric): Secondary | ICD-10-CM | POA: Diagnosis not present

## 2023-04-23 ENCOUNTER — Telehealth: Payer: Self-pay | Admitting: Cardiology

## 2023-04-23 ENCOUNTER — Encounter: Payer: Self-pay | Admitting: Cardiology

## 2023-04-23 NOTE — Telephone Encounter (Signed)
LVM with patient 3x to schedule overdue f/u with Dr. Jens Som or an APP with no success in scheduling. Will send letter to patient.

## 2023-04-24 DIAGNOSIS — Z794 Long term (current) use of insulin: Secondary | ICD-10-CM | POA: Diagnosis not present

## 2023-04-24 DIAGNOSIS — I2510 Atherosclerotic heart disease of native coronary artery without angina pectoris: Secondary | ICD-10-CM | POA: Diagnosis not present

## 2023-04-24 DIAGNOSIS — E1165 Type 2 diabetes mellitus with hyperglycemia: Secondary | ICD-10-CM | POA: Diagnosis not present

## 2023-04-24 DIAGNOSIS — E1142 Type 2 diabetes mellitus with diabetic polyneuropathy: Secondary | ICD-10-CM | POA: Diagnosis not present

## 2023-04-25 ENCOUNTER — Ambulatory Visit: Payer: Medicare HMO | Admitting: Cardiology

## 2023-05-17 DIAGNOSIS — G4733 Obstructive sleep apnea (adult) (pediatric): Secondary | ICD-10-CM | POA: Diagnosis not present

## 2023-05-25 DIAGNOSIS — S91112A Laceration without foreign body of left great toe without damage to nail, initial encounter: Secondary | ICD-10-CM | POA: Diagnosis not present

## 2023-06-05 DIAGNOSIS — Z4802 Encounter for removal of sutures: Secondary | ICD-10-CM | POA: Diagnosis not present

## 2023-06-05 DIAGNOSIS — N481 Balanitis: Secondary | ICD-10-CM | POA: Diagnosis not present

## 2023-06-05 DIAGNOSIS — N5089 Other specified disorders of the male genital organs: Secondary | ICD-10-CM | POA: Diagnosis not present

## 2023-06-05 DIAGNOSIS — M7989 Other specified soft tissue disorders: Secondary | ICD-10-CM | POA: Diagnosis not present

## 2023-06-08 DIAGNOSIS — N5089 Other specified disorders of the male genital organs: Secondary | ICD-10-CM | POA: Diagnosis not present

## 2023-06-08 DIAGNOSIS — N433 Hydrocele, unspecified: Secondary | ICD-10-CM | POA: Diagnosis not present

## 2023-06-08 DIAGNOSIS — R600 Localized edema: Secondary | ICD-10-CM | POA: Diagnosis not present

## 2023-06-08 DIAGNOSIS — M7989 Other specified soft tissue disorders: Secondary | ICD-10-CM | POA: Diagnosis not present

## 2023-06-13 DIAGNOSIS — Z6831 Body mass index (BMI) 31.0-31.9, adult: Secondary | ICD-10-CM | POA: Diagnosis not present

## 2023-06-13 DIAGNOSIS — M961 Postlaminectomy syndrome, not elsewhere classified: Secondary | ICD-10-CM | POA: Diagnosis not present

## 2023-06-13 DIAGNOSIS — F1120 Opioid dependence, uncomplicated: Secondary | ICD-10-CM | POA: Diagnosis not present

## 2023-06-14 ENCOUNTER — Telehealth: Payer: Self-pay | Admitting: *Deleted

## 2023-06-14 NOTE — Telephone Encounter (Signed)
   Pre-operative Risk Assessment    Patient Name: Kevin [name]  DOB: 1946/12/23 MRN: [id number]      Request for Surgical Clearance    Procedure:   RIGHT S1 TFESI  Date of Surgery:  Clearance TBD STAT                                Surgeon:  DR. DAVE [facility] Surgeon's Group or Practice Name:  [facility] Phone number:  [phone] Fax number:  [phone]   Type of Clearance Requested:   - Medical  - Pharmacy:  Hold Rivaroxaban (Xarelto) x 3 DAYS PRIOR AND RESUME THE DAY AFTER PROCEDURE   Type of Anesthesia:  Not Indicated   Additional requests/questions:    Elpidio Anis   06/14/2023, 5:55 PM

## 2023-06-18 NOTE — Telephone Encounter (Signed)
Spoke with patient who is agreeable to do a tele visit on 7/3 at 1:55 pm. I will faxed recommendations to requesting provider's office.

## 2023-06-18 NOTE — Telephone Encounter (Signed)
Patient with diagnosis of atrial fibrillation on Xarelto for anticoagulation.    Procedure:   RIGHT S1 TFESI   Date of Surgery:  Clearance TBD STAT   CHA2DS2-VASc Score = 5   This indicates a 7.2% annual risk of stroke. The patient's score is based upon: CHF History: 0 HTN History: 1 Diabetes History: 1 Stroke History: 0 Vascular Disease History: 1 Age Score: 2 Gender Score: 0    CrCl 92 Platelet count 217  Per office protocol, patient can hold Xarelto for 3 days prior to procedure.    Patient will not need bridging with Lovenox (enoxaparin) around procedure.  **This guidance is not considered finalized until pre-operative APP has relayed final recommendations.**

## 2023-06-18 NOTE — Telephone Encounter (Signed)
    Primary Cardiologist:Brian Jens Som, MD  Chart reviewed as part of pre-operative protocol coverage. Because of Kevin [name]'s past medical history and time since last visit, he/she will require a follow-up visit in order to better assess preoperative cardiovascular risk.  Pre-op covering staff: - Please schedule Office appointment and call patient to inform them. - Please contact requesting surgeon's office via preferred method (i.e, phone, fax) to inform them of need for appointment prior to surgery.  If applicable, this message will also be routed to pharmacy pool and/or primary cardiologist for input on holding anticoagulant/antiplatelet agent as requested below so that this information is available at time of patient's appointment.   Pharmacy has provided recommendations for anticoagulation hold  Ronney Asters, NP  06/18/2023, 10:27 AM

## 2023-06-19 ENCOUNTER — Other Ambulatory Visit: Payer: Self-pay | Admitting: Neurosurgery

## 2023-06-19 DIAGNOSIS — M961 Postlaminectomy syndrome, not elsewhere classified: Secondary | ICD-10-CM

## 2023-06-20 DIAGNOSIS — G4733 Obstructive sleep apnea (adult) (pediatric): Secondary | ICD-10-CM | POA: Diagnosis not present

## 2023-06-25 NOTE — Progress Notes (Unsigned)
Cardiology Clinic Note   Patient Name: Kevin [name] Date of Encounter: 06/27/2023  Primary Care Provider:  Kirby Funk, MD (Inactive) Primary Cardiologist:  Olga Millers, MD  Patient Profile    Kevin [name] 77 year old male presents to the clinic today for follow-up evaluation of his atrial fibrillation, hypertension, and preoperative cardiac evaluation.  Past Medical History    Past Medical History:  Diagnosis Date   Diabetes mellitus    Fall    FUO (fever of unknown origin) 01/19/2016   GERD (gastroesophageal reflux disease)    History of hiatal hernia    Hypertension    Occasional tremors    Prostatitis 01/19/2016   Sleep apnea    . Last test was before 2007 , not sure name of test site. uses CPAP   Syncope    Wears glasses    Past Surgical History:  Procedure Laterality Date   BACK SURGERY     CHOLECYSTECTOMY N/A 06/15/2016   Procedure: LAPAROSCOPIC CHOLECYSTECTOMY WITH INTRAOPERATIVE CHOLANGIOGRAM;  Surgeon: Darnell Level, MD;  Location: [facility];  Service: General;  Laterality: N/A;   Colonoscopy  2011   EXTRACORPOREAL SHOCK WAVE LITHOTRIPSY Right 05/29/2022   Procedure: EXTRACORPOREAL SHOCK WAVE LITHOTRIPSY (ESWL);  Surgeon: Bjorn Pippin, MD;  Location: [facility];  Service: Urology;  Laterality: Right;   EYE SURGERY     Cataract 2013   HIATAL HERNIA REPAIR     x2   KNEE ARTHROSCOPY     bil   LUMBAR FUSION  2013   PATELLA FRACTURE SURGERY      Allergies  No Known Allergies  History of Present Illness    [name] has a PMH of paroxysmal atrial fibrillation and primary hypertension.  He was admitted with encephalopathy and fever.  He was found to have PNA.  He developed atrial fibrillation and converted back to normal sinus rhythm.  His echocardiogram 3/21 showed normal LV function, G1 DD, mild left ventricular hypertrophy, mild left atrial enlargement, trace aortic and mitral regurgitation.  He was seen in follow-up by Dr. Jens Som  03/17/2022.  During that time he noted increased work of breathing with increased physical activity.  His breathing was normal at rest.  He denied exertional chest discomfort.  He denied orthopnea PND and lower extremity swelling.  He denied presyncope and palpitations.    He presents to the clinic today for follow-up evaluation and states he was recently on a stepstool and had a fall.  He got his left great toe and required 2 stitches.  His left leg is slightly bigger than his right.  We reviewed his echocardiogram from 2021.  He and his wife expressed understanding.  We reviewed his upcoming epidural spinal injection and recommendations for holding his Xarelto.  He expressed understanding.  He remained stable from a cardiac standpoint.  Today he denies chest pain, shortness of breath, lower extremity edema, fatigue, palpitations, melena, hematuria, hemoptysis, diaphoresis, weakness, presyncope, syncope, orthopnea, and PND.    Home Medications    Prior to Admission medications   Medication Sig Start Date End Date Taking? Authorizing Provider  Acai 500 MG CAPS Take by mouth.    [provider]  amLODipine (NORVASC) 5 MG tablet Take 5 mg by mouth daily. 02/17/20   [provider]  atorvastatin (LIPITOR) 80 MG tablet Take 80 mg by mouth at bedtime.     [provider]  ferrous sulfate 325 (65 FE) MG EC tablet Take 325 mg by mouth  3 (three) times daily with meals. Takes every Friday.    [provider]  gabapentin (NEURONTIN) 100 MG capsule Take 200 mg by mouth 3 (three) times daily.    [provider]  losartan (COZAAR) 100 MG tablet Take 100 mg by mouth daily.    [provider]  metFORMIN (GLUCOPHAGE) 1000 MG tablet Take 1,000 mg by mouth 2 (two) times daily with a meal.     [provider]  metoprolol tartrate (LOPRESSOR) 50 MG tablet TAKE 1 TABLET BY MOUTH 2 TIMES A DAY 04/17/23   Lewayne Bunting, MD  NOVOLIN 70/30 (70-30) 100  UNIT/ML injection Inject 45 Units into the skin 2 (two) times daily with a meal.  02/25/16   [provider]  omeprazole (PRILOSEC) 40 MG capsule Take 40 mg by mouth daily.    [provider]  oxybutynin (DITROPAN-XL) 5 MG 24 hr tablet Take 5 mg by mouth at bedtime.    [provider]  Oxycodone HCl 10 MG TABS Take 10 mg by mouth every 4 (four) hours as needed (for pain).  02/22/20   [provider]  potassium chloride (KLOR-CON) 20 MEQ packet Take by mouth 2 (two) times daily.    [provider]  Resveratrol 100 MG CAPS Take by mouth.    [provider]  rivaroxaban (XARELTO) 20 MG TABS tablet Take 1 tablet (20 mg total) by mouth daily with supper. OVERDUE for FOLLOW-UP, MUST see MD for FUTURE refills. 04/13/23   Lewayne Bunting, MD  tadalafil (CIALIS) 5 MG tablet Take 5 mg by mouth daily as needed for erectile dysfunction.    [provider]  tamsulosin (FLOMAX) 0.4 MG CAPS capsule Take 0.4 mg by mouth in the morning and at bedtime. 12/28/19   [provider]  Turmeric 500 MG CAPS Take by mouth. TAKE EVERY THIRD DAY    [provider]  vitamin B-12 (CYANOCOBALAMIN) 1000 MCG tablet Take 1,000 mcg by mouth daily.    [provider]    Family History    Family History  Problem Relation Age of Onset   Coronary artery disease Other    He indicated that the status of his other is unknown.  Social History    Social History   Socioeconomic History   Marital status: Married    Spouse name: Not on file   Number of children: Not on file   Years of education: Not on file   Highest education level: Not on file  Occupational History   Not on file  Tobacco Use   Smoking status: Former    Packs/day: 1.00    Years: 4.00    Additional pack years: 0.00    Total pack years: 4.00    Types: Cigarettes    Quit date: 09/04/1980    Years since quitting: 42.8   Smokeless tobacco: Never  Substance and Sexual  Activity   Alcohol use: No    Alcohol/week: 0.0 standard drinks of alcohol    Comment: rarely   Drug use: No   Sexual activity: Not on file  Other Topics Concern   Not on file  Social History Narrative   Not on file   Social Determinants of Health   Financial Resource Strain: Not on file  Food Insecurity: Not on file  Transportation Needs: Not on file  Physical Activity: Not on file  Stress: Not on file  Social Connections: Not on file  Intimate Partner Violence: Not on file  Review of Systems    General:  No chills, fever, night sweats or weight changes.  Cardiovascular:  No chest pain, dyspnea on exertion, edema, orthopnea, palpitations, paroxysmal nocturnal dyspnea. Dermatological: No rash, lesions/masses Respiratory: No cough, dyspnea Urologic: No hematuria, dysuria Abdominal:   No nausea, vomiting, diarrhea, bright red blood per rectum, melena, or hematemesis Neurologic:  No visual changes, wkns, changes in mental status. All other systems reviewed and are otherwise negative except as noted above.  Physical Exam    VS:  BP 128/86   Pulse 71   Ht 6' (1.829 m)   Wt 243 lb 6.4 oz (110.4 kg)   SpO2 96%   BMI 33.01 kg/m  , BMI Body mass index is 33.01 kg/m. GEN: Well nourished, well developed, in no acute distress. HEENT: normal. Neck: Supple, no JVD, carotid bruits, or masses. Cardiac: RRR, no murmurs, rubs, or gallops. No clubbing, cyanosis, generalized nonpitting left greater than right edema.  Radials/DP/PT 2+ and equal bilaterally.  Respiratory:  Respirations regular and unlabored, clear to auscultation bilaterally. GI: Soft, nontender, nondistended, BS + x 4. MS: no deformity or atrophy. Skin: warm and dry, no rash. Neuro:  Strength and sensation are intact. Psych: Normal affect.  Accessory Clinical Findings    Recent Labs: No results found for requested labs within last 365 days.   Recent Lipid Panel No results found for: "CHOL", "TRIG", "HDL",  "CHOLHDL", "VLDL", "LDLCALC", "LDLDIRECT"       ECG personally reviewed by me today-normal sinus rhythm nonspecific T wave abnormality 68 bpm- No acute changes  Echocardiogram 02/27/2020  IMPRESSIONS     1. Normal LV systolic function; grade 1 diastolic dysfunction; mild LVH;  mildly dilated aortic root; mild LAE; trace AI and MR.   2. Left ventricular ejection fraction, by estimation, is 55 to 60%. The  left ventricle has normal function. The left ventricle has no regional  wall motion abnormalities. There is mild left ventricular hypertrophy.  Left ventricular diastolic parameters  are consistent with Grade I diastolic dysfunction (impaired relaxation).   3. Right ventricular systolic function is normal. The right ventricular  size is normal.   4. Left atrial size was mildly dilated.   5. The mitral valve is normal in structure and function. Trivial mitral  valve regurgitation. No evidence of mitral stenosis.   6. The aortic valve has an indeterminant number of cusps. Aortic valve  regurgitation is trivial. No aortic stenosis is present.   7. Aortic dilatation noted. 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   FINDINGS   Left Ventricle: Left ventricular ejection fraction, by estimation, is 55  to 60%. The left ventricle has normal function. The left ventricle has no  regional wall motion abnormalities. The left ventricular internal cavity  size was normal in size. There is   mild left ventricular hypertrophy. Left ventricular diastolic parameters  are consistent with Grade I diastolic dysfunction (impaired relaxation).   Right Ventricle: The right ventricular size is normal. Right ventricular  systolic function is normal.   Left Atrium: Left atrial size was mildly dilated.   Right Atrium: Right atrial size was normal in size.   Pericardium: There is no evidence of pericardial effusion.   Mitral Valve: The mitral valve is normal in structure and  function. Normal  mobility of the mitral valve leaflets. Trivial mitral valve regurgitation.  No evidence of mitral valve stenosis.   Tricuspid Valve: The tricuspid valve is normal in structure. Tricuspid  valve  regurgitation is trivial. No evidence of tricuspid stenosis.   Aortic Valve: The aortic valve has an indeterminant number of cusps.  Aortic valve regurgitation is trivial. Aortic regurgitation PHT measures  960 msec. No aortic stenosis is present. Aortic valve mean gradient  measures 4.0 mmHg. Aortic valve peak  gradient measures 7.0 mmHg. Aortic valve area, by VTI measures 2.77 cm.   Pulmonic Valve: The pulmonic valve was not well visualized. Pulmonic valve  regurgitation is not visualized. No evidence of pulmonic stenosis.   Aorta: Aortic dilatation noted. There is mild dilatation of the aortic  root measuring 40 mm.   Venous: The inferior vena cava was not well visualized.     Assessment & Plan   1.  Paroxysmal atrial fibrillation-EKG today shows normal sinus rhythm nonspecific T wave abnormality 68 bpm.  Reports compliance with Xarelto.  Denies bleeding issues. Continue metoprolol, Xarelto Avoid triggers caffeine, chocolate, EtOH, dehydration etc.  Essential hypertension-BP today 128/86. Maintain blood pressure log Continue current medical therapy Heart healthy low-sodium diet Increase physical activity as tolerated  Preoperative cardiac evaluation- RIGHT S1 TFESI , Dr Aileen Fass, TBD, [phone]   Primary Cardiologist: Olga Millers, MD  Chart reviewed as part of pre-operative protocol coverage. Given past medical history and time since last visit, based on ACC/AHA guidelines, Braelin C Derrig would be at acceptable risk for the planned procedure without further cardiovascular testing.   His RCRI is a class 2 risk, 0.9% risk of major cardiac event.  He is able to complete greater than 4 METS of physical activity.  Per office protocol, patient can hold  Xarelto for 3 days prior to procedure.    Patient will not need bridging with Lovenox (enoxaparin) around procedure  Patient was advised that if he develops new symptoms prior to surgery to contact our office to arrange a follow-up appointment.  He verbalized understanding.  I will route this recommendation to the requesting party via Epic fax function and remove from pre-op pool.   Disposition: Follow-up with Dr. Jens Som or me in 9-12 months.   Thomasene Ripple. Connelly [place] NP-C     06/27/2023, 3:03 PM [facility] [address] Office [phone] Fax [phone]    I spent 14 minutes examining this patient, reviewing medications, and using patient centered shared decision making involving her cardiac care.  Prior to her visit I spent greater than 20 minutes reviewing her past medical history,  medications, and prior cardiac tests.

## 2023-06-26 ENCOUNTER — Other Ambulatory Visit: Payer: Self-pay | Admitting: Neurosurgery

## 2023-06-27 ENCOUNTER — Ambulatory Visit: Payer: Medicare HMO | Attending: General Practice | Admitting: General Practice

## 2023-06-27 ENCOUNTER — Encounter: Payer: Self-pay | Admitting: General Practice

## 2023-06-27 VITALS — BP 128/86 | HR 71 | Ht 72.0 in | Wt 243.4 lb

## 2023-06-27 DIAGNOSIS — I10 Essential (primary) hypertension: Secondary | ICD-10-CM | POA: Diagnosis not present

## 2023-06-27 DIAGNOSIS — Z01818 Encounter for other preprocedural examination: Secondary | ICD-10-CM

## 2023-06-27 DIAGNOSIS — I480 Paroxysmal atrial fibrillation: Secondary | ICD-10-CM

## 2023-06-27 NOTE — Patient Instructions (Signed)
Medication Instructions:  The current medical regimen is effective;  continue present plan and medications as directed. Please refer to the Current Medication list given to you today.  *If you need a refill on your cardiac medications before your next appointment, please call your pharmacy*  Lab Work: NONE If you have labs (blood work) drawn today and your tests are completely normal, you will receive your results only by:  MyChart Message (if you have MyChart) OR  A paper copy in the mail If you have any lab test that is abnormal or we need to change your treatment, we will call you to review the results.  Other Instructions OK FOR INJECTION  Follow-Up: At [facility], you and your health needs are our priority.  As part of our continuing mission to provide you with exceptional heart care, we have created designated Provider Care Teams.  These Care Teams include your primary Cardiologist (physician) and Advanced Practice Providers (APPs -  Physician Assistants and Nurse Practitioners) who all work together to provide you with the care you need, when you need it.  Your next appointment:   12 month(s)  Provider:   Olga Millers, MD

## 2023-06-29 ENCOUNTER — Ambulatory Visit
Admission: RE | Admit: 2023-06-29 | Discharge: 2023-06-29 | Disposition: A | Discharge status: Home / Self Care | Payer: Medicare HMO | Source: Ambulatory Visit | Attending: Neurosurgery | Admitting: Neurosurgery

## 2023-06-29 DIAGNOSIS — M48061 Spinal stenosis, lumbar region without neurogenic claudication: Secondary | ICD-10-CM | POA: Diagnosis not present

## 2023-06-29 DIAGNOSIS — M961 Postlaminectomy syndrome, not elsewhere classified: Secondary | ICD-10-CM

## 2023-06-29 DIAGNOSIS — M47816 Spondylosis without myelopathy or radiculopathy, lumbar region: Secondary | ICD-10-CM | POA: Diagnosis not present

## 2023-06-29 DIAGNOSIS — Z981 Arthrodesis status: Secondary | ICD-10-CM | POA: Diagnosis not present

## 2023-07-05 DIAGNOSIS — M5416 Radiculopathy, lumbar region: Secondary | ICD-10-CM | POA: Diagnosis not present

## 2023-07-12 DIAGNOSIS — Z9989 Dependence on other enabling machines and devices: Secondary | ICD-10-CM | POA: Diagnosis not present

## 2023-07-12 DIAGNOSIS — I480 Paroxysmal atrial fibrillation: Secondary | ICD-10-CM | POA: Diagnosis not present

## 2023-07-12 DIAGNOSIS — D509 Iron deficiency anemia, unspecified: Secondary | ICD-10-CM | POA: Diagnosis not present

## 2023-07-12 DIAGNOSIS — I10 Essential (primary) hypertension: Secondary | ICD-10-CM | POA: Diagnosis not present

## 2023-07-12 DIAGNOSIS — D6869 Other thrombophilia: Secondary | ICD-10-CM | POA: Diagnosis not present

## 2023-07-12 DIAGNOSIS — G4733 Obstructive sleep apnea (adult) (pediatric): Secondary | ICD-10-CM | POA: Diagnosis not present

## 2023-07-12 DIAGNOSIS — E119 Type 2 diabetes mellitus without complications: Secondary | ICD-10-CM | POA: Diagnosis not present

## 2023-07-12 DIAGNOSIS — I700 Atherosclerosis of aorta: Secondary | ICD-10-CM | POA: Diagnosis not present

## 2023-07-12 DIAGNOSIS — N433 Hydrocele, unspecified: Secondary | ICD-10-CM | POA: Diagnosis not present

## 2023-07-19 NOTE — Progress Notes (Deleted)
HPI: Follow-up atrial fibrillation.  Patient previously admitted with encephalopathy and fever and found to have pneumonia.  He developed atrial fibrillation but converted back to sinus rhythm. Echo March 2021 showed normal LV function, grade 1 diastolic dysfunction, mild left ventricular hypertrophy, mild left atrial enlargement, trace aortic and mitral regurgitation.  Since last seen,   Current Outpatient Medications  Medication Sig Dispense Refill   Acai 500 MG CAPS Take by mouth.     amLODipine (NORVASC) 5 MG tablet Take 5 mg by mouth daily.     atorvastatin (LIPITOR) 80 MG tablet Take 80 mg by mouth at bedtime.      ferrous sulfate 325 (65 FE) MG EC tablet Take 325 mg by mouth 3 (three) times daily with meals. Takes every Friday.     gabapentin (NEURONTIN) 100 MG capsule Take 200 mg by mouth 3 (three) times daily.     losartan (COZAAR) 100 MG tablet Take 100 mg by mouth daily.     metFORMIN (GLUCOPHAGE) 1000 MG tablet Take 1,000 mg by mouth 2 (two) times daily with a meal.      metoprolol tartrate (LOPRESSOR) 50 MG tablet TAKE 1 TABLET BY MOUTH 2 TIMES A DAY 180 tablet 0   NOVOLIN 70/30 (70-30) 100 UNIT/ML injection Inject 45 Units into the skin 2 (two) times daily with a meal.      omeprazole (PRILOSEC) 40 MG capsule Take 40 mg by mouth daily.     oxybutynin (DITROPAN-XL) 5 MG 24 hr tablet Take 5 mg by mouth at bedtime.     Oxycodone HCl 10 MG TABS Take 10 mg by mouth every 4 (four) hours as needed (for pain).      potassium chloride (KLOR-CON) 20 MEQ packet Take by mouth 2 (two) times daily.     Resveratrol 100 MG CAPS Take by mouth.     rivaroxaban (XARELTO) 20 MG TABS tablet Take 1 tablet (20 mg total) by mouth daily with supper. OVERDUE for FOLLOW-UP, MUST see MD for FUTURE refills. 90 tablet 0   tadalafil (CIALIS) 5 MG tablet Take 5 mg by mouth daily as needed for erectile dysfunction.     tamsulosin (FLOMAX) 0.4 MG CAPS capsule Take 0.4 mg by mouth in the morning and at  bedtime.     Turmeric 500 MG CAPS Take by mouth. TAKE EVERY THIRD DAY     vitamin B-12 (CYANOCOBALAMIN) 1000 MCG tablet Take 1,000 mcg by mouth daily.     No current facility-administered medications for this visit.     Past Medical History:  Diagnosis Date   Diabetes mellitus    Fall    FUO (fever of unknown origin) 01/19/2016   GERD (gastroesophageal reflux disease)    History of hiatal hernia    Hypertension    Occasional tremors    Prostatitis 01/19/2016   Sleep apnea    . Last test was before 2007 , not sure name of test site. uses CPAP   Syncope    Wears glasses     Past Surgical History:  Procedure Laterality Date   BACK SURGERY     CHOLECYSTECTOMY N/A 06/15/2016   Procedure: LAPAROSCOPIC CHOLECYSTECTOMY WITH INTRAOPERATIVE CHOLANGIOGRAM;  Surgeon: Darnell Level, MD;  Location: [facility];  Service: General;  Laterality: N/A;   Colonoscopy  2011   EXTRACORPOREAL SHOCK WAVE LITHOTRIPSY Right 05/29/2022   Procedure: EXTRACORPOREAL SHOCK WAVE LITHOTRIPSY (ESWL);  Surgeon: Bjorn Pippin, MD;  Location: [facility];  Service: Urology;  Laterality:  Right;   EYE SURGERY     Cataract 2013   HIATAL HERNIA REPAIR     x2   KNEE ARTHROSCOPY     bil   LUMBAR FUSION  2013   PATELLA FRACTURE SURGERY      Social History   Socioeconomic History   Marital status: Married    Spouse name: Not on file   Number of children: Not on file   Years of education: Not on file   Highest education level: Not on file  Occupational History   Not on file  Tobacco Use   Smoking status: Former    Current packs/day: 0.00    Average packs/day: 1 pack/day for 4.0 years (4.0 ttl pk-yrs)    Types: Cigarettes    Start date: 09/04/1976    Quit date: 09/04/1980    Years since quitting: 42.8   Smokeless tobacco: Never  Substance and Sexual Activity   Alcohol use: No    Alcohol/week: 0.0 standard drinks of alcohol    Comment: rarely   Drug use: No   Sexual activity: Not on file  Other  Topics Concern   Not on file  Social History Narrative   Not on file   Social Determinants of Health   Financial Resource Strain: Not on file  Food Insecurity: Not on file  Transportation Needs: Not on file  Physical Activity: Not on file  Stress: Not on file  Social Connections: Not on file  Intimate Partner Violence: Not on file    Family History  Problem Relation Age of Onset   Coronary artery disease Other     ROS: no fevers or chills, productive cough, hemoptysis, dysphasia, odynophagia, melena, hematochezia, dysuria, hematuria, rash, seizure activity, orthopnea, PND, pedal edema, claudication. Remaining systems are negative.  Physical Exam: Well-developed well-nourished in no acute distress.  Skin is warm and dry.  HEENT is normal.  Neck is supple.  Chest is clear to auscultation with normal expansion.  Cardiovascular exam is regular rate and rhythm.  Abdominal exam nontender or distended. No masses palpated. Extremities show no edema. neuro grossly intact  ECG- personally reviewed  A/P  1 PAF-patient is in sinus rhythm.  Continue metoprolol and Xarelto.  2 hypertension-patient's blood pressure is controlled.  Continue present medications and follow.  Olga Millers, MD

## 2023-07-20 DIAGNOSIS — G4733 Obstructive sleep apnea (adult) (pediatric): Secondary | ICD-10-CM | POA: Diagnosis not present

## 2023-07-22 ENCOUNTER — Other Ambulatory Visit: Payer: Medicare HMO

## 2023-07-24 DIAGNOSIS — E1142 Type 2 diabetes mellitus with diabetic polyneuropathy: Secondary | ICD-10-CM | POA: Diagnosis not present

## 2023-07-24 DIAGNOSIS — Z794 Long term (current) use of insulin: Secondary | ICD-10-CM | POA: Diagnosis not present

## 2023-07-24 DIAGNOSIS — D509 Iron deficiency anemia, unspecified: Secondary | ICD-10-CM | POA: Diagnosis not present

## 2023-07-24 DIAGNOSIS — I2510 Atherosclerotic heart disease of native coronary artery without angina pectoris: Secondary | ICD-10-CM | POA: Diagnosis not present

## 2023-07-24 DIAGNOSIS — E669 Obesity, unspecified: Secondary | ICD-10-CM | POA: Diagnosis not present

## 2023-07-24 DIAGNOSIS — E1165 Type 2 diabetes mellitus with hyperglycemia: Secondary | ICD-10-CM | POA: Diagnosis not present

## 2023-07-29 ENCOUNTER — Other Ambulatory Visit: Payer: Self-pay | Admitting: Cardiology

## 2023-07-29 DIAGNOSIS — I4891 Unspecified atrial fibrillation: Secondary | ICD-10-CM

## 2023-07-30 NOTE — Telephone Encounter (Signed)
Prescription refill request for Xarelto received.  Indication:afib Last office visit:7/24 Weight:110.4  kg Age:77 Scr:0.93  7/24 CrCl:103.87  ml/min  Prescription refilled

## 2023-08-01 ENCOUNTER — Ambulatory Visit: Payer: Medicare HMO | Admitting: Cardiology

## 2023-08-06 NOTE — Progress Notes (Signed)
HPI: Follow-up atrial fibrillation.  Patient previously admitted with encephalopathy and fever and found to have pneumonia.  He developed atrial fibrillation but converted back to sinus rhythm.  Echocardiogram March 2021 showed normal LV function, grade 1 diastolic dysfunction, mild left ventricular hypertrophy, mild left atrial enlargement, trace aortic and mitral regurgitation.  Since last seen, pt denies CP, dyspnea or palpitations; no bleeding.  Current Outpatient Medications  Medication Sig Dispense Refill   Acai 500 MG CAPS Take 1 capsule by mouth. Every 3 days     amLODipine (NORVASC) 5 MG tablet Take 5 mg by mouth daily.     atorvastatin (LIPITOR) 80 MG tablet Take 80 mg by mouth at bedtime.      chlorhexidine (PERIDEX) 0.12 % solution Use as directed 15 mLs in the mouth or throat 2 (two) times daily.     ferrous sulfate 325 (65 FE) MG EC tablet Take 325 mg by mouth 3 (three) times a week. Takes Mon wed and fri     furosemide (LASIX) 20 MG tablet Take 20 mg by mouth as needed.     JARDIANCE 10 MG TABS tablet Take 10 mg by mouth daily.     losartan (COZAAR) 100 MG tablet Take 100 mg by mouth daily.     metFORMIN (GLUCOPHAGE) 1000 MG tablet Take 1,000 mg by mouth 2 (two) times daily with a meal.      metoprolol tartrate (LOPRESSOR) 50 MG tablet TAKE 1 TABLET BY MOUTH 2 TIMES A DAY 180 tablet 0   NOVOLIN 70/30 (70-30) 100 UNIT/ML injection Inject 45 Units into the skin 2 (two) times daily with a meal.      omeprazole (PRILOSEC) 40 MG capsule Take 40 mg by mouth daily.     ONETOUCH VERIO test strip 1 each in the morning, at noon, and at bedtime.     oxybutynin (DITROPAN-XL) 5 MG 24 hr tablet Take 5 mg by mouth at bedtime.     Oxycodone HCl 10 MG TABS Take 10 mg by mouth every 4 (four) hours as needed (for pain).      OZEMPIC, 0.25 OR 0.5 MG/DOSE, 2 MG/3ML SOPN Inject into the skin once a week.     potassium chloride (KLOR-CON) 20 MEQ packet Take by mouth 2 (two) times daily. As needed      pregabalin (LYRICA) 50 MG capsule Take 50 mg by mouth daily.     Resveratrol 100 MG CAPS Take by mouth. Every other 3rd day     rivaroxaban (XARELTO) 20 MG TABS tablet Take 1 tablet (20 mg total) by mouth daily with supper. 90 tablet 1   tadalafil (CIALIS) 5 MG tablet Take 5 mg by mouth daily as needed for erectile dysfunction.     tamsulosin (FLOMAX) 0.4 MG CAPS capsule Take 0.4 mg by mouth in the morning and at bedtime.     Turmeric 500 MG CAPS Take by mouth. TAKE EVERY THIRD DAY     vitamin B-12 (CYANOCOBALAMIN) 1000 MCG tablet Take 1,000 mcg by mouth 3 (three) times a week.     gabapentin (NEURONTIN) 100 MG capsule Take 200 mg by mouth 3 (three) times daily.     No current facility-administered medications for this visit.     Past Medical History:  Diagnosis Date   Diabetes mellitus    Fall    FUO (fever of unknown origin) 01/19/2016   GERD (gastroesophageal reflux disease)    History of hiatal hernia    Hypertension  Occasional tremors    Prostatitis 01/19/2016   Sleep apnea    . Last test was before 2007 , not sure name of test site. uses CPAP   Syncope    Wears glasses     Past Surgical History:  Procedure Laterality Date   BACK SURGERY     CHOLECYSTECTOMY N/A 06/15/2016   Procedure: LAPAROSCOPIC CHOLECYSTECTOMY WITH INTRAOPERATIVE CHOLANGIOGRAM;  Surgeon: Darnell Level, MD;  Location: [facility];  Service: General;  Laterality: N/A;   Colonoscopy  2011   EXTRACORPOREAL SHOCK WAVE LITHOTRIPSY Right 05/29/2022   Procedure: EXTRACORPOREAL SHOCK WAVE LITHOTRIPSY (ESWL);  Surgeon: Bjorn Pippin, MD;  Location: [facility];  Service: Urology;  Laterality: Right;   EYE SURGERY     Cataract 2013   HIATAL HERNIA REPAIR     x2   KNEE ARTHROSCOPY     bil   LUMBAR FUSION  2013   PATELLA FRACTURE SURGERY      Social History   Socioeconomic History   Marital status: Married    Spouse name: Not on file   Number of children: Not on file   Years of education: Not  on file   Highest education level: Not on file  Occupational History   Not on file  Tobacco Use   Smoking status: Former    Current packs/day: 0.00    Average packs/day: 1 pack/day for 4.0 years (4.0 ttl pk-yrs)    Types: Cigarettes    Start date: 09/04/1976    Quit date: 09/04/1980    Years since quitting: 42.9   Smokeless tobacco: Never  Substance and Sexual Activity   Alcohol use: No    Alcohol/week: 0.0 standard drinks of alcohol    Comment: rarely   Drug use: No   Sexual activity: Not on file  Other Topics Concern   Not on file  Social History Narrative   Not on file   Social Determinants of Health   Financial Resource Strain: Not on file  Food Insecurity: Not on file  Transportation Needs: Not on file  Physical Activity: Not on file  Stress: Not on file  Social Connections: Not on file  Intimate Partner Violence: Not on file    Family History  Problem Relation Age of Onset   Coronary artery disease Other     ROS: no fevers or chills, productive cough, hemoptysis, dysphasia, odynophagia, melena, hematochezia, dysuria, hematuria, rash, seizure activity, orthopnea, PND, pedal edema, claudication. Remaining systems are negative.  Physical Exam: Well-developed well-nourished in no acute distress.  Skin is warm and dry.  HEENT is normal.  Neck is supple.  Chest is clear to auscultation with normal expansion.  Cardiovascular exam is regular rate and rhythm.  Abdominal exam nontender or distended. No masses palpated. Extremities show trace edema. neuro grossly intact  ECG-June 27, 2023-normal sinus rhythm with nonspecific ST changes.  Personally reviewed  A/P  1 paroxysmal atrial fibrillation-patient is in sinus rhythm.  Will continue Xarelto.  As outlined in previous notes his initial episode occurred in the setting of infection.  However he was asymptomatic and I was concerned that he may be having paroxysms at home.  I have therefore elected to continue  long-term anticoagulation.  2 hypertension-blood pressure controlled.  Continue present medications.  Olga Millers, MD

## 2023-08-14 ENCOUNTER — Encounter: Payer: Self-pay | Admitting: Cardiology

## 2023-08-14 ENCOUNTER — Ambulatory Visit: Payer: Medicare HMO | Attending: Cardiology | Admitting: Cardiology

## 2023-08-14 ENCOUNTER — Encounter: Payer: Self-pay | Admitting: *Deleted

## 2023-08-14 VITALS — BP 100/60 | HR 72 | Ht 72.0 in | Wt 247.0 lb

## 2023-08-14 DIAGNOSIS — I480 Paroxysmal atrial fibrillation: Secondary | ICD-10-CM | POA: Diagnosis not present

## 2023-08-14 DIAGNOSIS — I10 Essential (primary) hypertension: Secondary | ICD-10-CM

## 2023-08-14 NOTE — Addendum Note (Signed)
Addended by: Freddi Starr on: 08/14/2023 03:45 PM   Modules accepted: Orders

## 2023-08-14 NOTE — Patient Instructions (Signed)

## 2023-08-20 DIAGNOSIS — G4733 Obstructive sleep apnea (adult) (pediatric): Secondary | ICD-10-CM | POA: Diagnosis not present

## 2023-08-28 DIAGNOSIS — E119 Type 2 diabetes mellitus without complications: Secondary | ICD-10-CM | POA: Diagnosis not present

## 2023-08-28 DIAGNOSIS — Z961 Presence of intraocular lens: Secondary | ICD-10-CM | POA: Diagnosis not present

## 2023-08-28 DIAGNOSIS — H04123 Dry eye syndrome of bilateral lacrimal glands: Secondary | ICD-10-CM | POA: Diagnosis not present

## 2023-08-28 DIAGNOSIS — H26491 Other secondary cataract, right eye: Secondary | ICD-10-CM | POA: Diagnosis not present

## 2023-08-28 DIAGNOSIS — G4730 Sleep apnea, unspecified: Secondary | ICD-10-CM | POA: Diagnosis not present

## 2023-08-28 DIAGNOSIS — D492 Neoplasm of unspecified behavior of bone, soft tissue, and skin: Secondary | ICD-10-CM | POA: Diagnosis not present

## 2023-08-28 DIAGNOSIS — H02831 Dermatochalasis of right upper eyelid: Secondary | ICD-10-CM | POA: Diagnosis not present

## 2023-08-28 DIAGNOSIS — H02834 Dermatochalasis of left upper eyelid: Secondary | ICD-10-CM | POA: Diagnosis not present

## 2023-08-31 DIAGNOSIS — N430 Encysted hydrocele: Secondary | ICD-10-CM | POA: Diagnosis not present

## 2023-09-05 DIAGNOSIS — Z6831 Body mass index (BMI) 31.0-31.9, adult: Secondary | ICD-10-CM | POA: Diagnosis not present

## 2023-09-05 DIAGNOSIS — F1120 Opioid dependence, uncomplicated: Secondary | ICD-10-CM | POA: Diagnosis not present

## 2023-09-05 DIAGNOSIS — M961 Postlaminectomy syndrome, not elsewhere classified: Secondary | ICD-10-CM | POA: Diagnosis not present

## 2023-09-05 DIAGNOSIS — G894 Chronic pain syndrome: Secondary | ICD-10-CM | POA: Diagnosis not present

## 2023-09-13 ENCOUNTER — Other Ambulatory Visit: Payer: Self-pay | Admitting: Cardiology

## 2023-09-24 DIAGNOSIS — G4733 Obstructive sleep apnea (adult) (pediatric): Secondary | ICD-10-CM | POA: Diagnosis not present

## 2023-10-19 DIAGNOSIS — M960 Pseudarthrosis after fusion or arthrodesis: Secondary | ICD-10-CM | POA: Diagnosis not present

## 2023-10-19 DIAGNOSIS — E1169 Type 2 diabetes mellitus with other specified complication: Secondary | ICD-10-CM | POA: Diagnosis not present

## 2023-10-19 DIAGNOSIS — M8580 Other specified disorders of bone density and structure, unspecified site: Secondary | ICD-10-CM | POA: Diagnosis not present

## 2023-10-19 DIAGNOSIS — M5030 Other cervical disc degeneration, unspecified cervical region: Secondary | ICD-10-CM | POA: Diagnosis not present

## 2023-10-19 DIAGNOSIS — M5134 Other intervertebral disc degeneration, thoracic region: Secondary | ICD-10-CM | POA: Diagnosis not present

## 2023-10-19 DIAGNOSIS — M21161 Varus deformity, not elsewhere classified, right knee: Secondary | ICD-10-CM | POA: Diagnosis not present

## 2023-10-19 DIAGNOSIS — M4316 Spondylolisthesis, lumbar region: Secondary | ICD-10-CM | POA: Diagnosis not present

## 2023-10-19 DIAGNOSIS — M4312 Spondylolisthesis, cervical region: Secondary | ICD-10-CM | POA: Diagnosis not present

## 2023-10-19 DIAGNOSIS — M4030 Flatback syndrome, site unspecified: Secondary | ICD-10-CM | POA: Diagnosis not present

## 2023-10-19 DIAGNOSIS — M21162 Varus deformity, not elsewhere classified, left knee: Secondary | ICD-10-CM | POA: Diagnosis not present

## 2023-10-19 DIAGNOSIS — M51369 Other intervertebral disc degeneration, lumbar region without mention of lumbar back pain or lower extremity pain: Secondary | ICD-10-CM | POA: Diagnosis not present

## 2023-10-19 DIAGNOSIS — Z981 Arthrodesis status: Secondary | ICD-10-CM | POA: Diagnosis not present

## 2023-11-20 DIAGNOSIS — M961 Postlaminectomy syndrome, not elsewhere classified: Secondary | ICD-10-CM | POA: Diagnosis not present

## 2023-11-20 DIAGNOSIS — G894 Chronic pain syndrome: Secondary | ICD-10-CM | POA: Diagnosis not present

## 2023-11-20 DIAGNOSIS — F1120 Opioid dependence, uncomplicated: Secondary | ICD-10-CM | POA: Diagnosis not present

## 2023-12-13 ENCOUNTER — Telehealth: Payer: Self-pay

## 2023-12-13 DIAGNOSIS — Z01818 Encounter for other preprocedural examination: Secondary | ICD-10-CM

## 2023-12-13 NOTE — Telephone Encounter (Signed)
   Pre-operative Risk Assessment    Patient Name: MAHIN DINSMOOR  DOB: [birth year] MRN: [id number]      Request for Surgical Clearance    Procedure:   COLONOSCOPY/ENDOSCOPY  Date of Surgery:  Clearance TBD                                 Surgeon:  DR. Kerin Salen Surgeon's Group or Practice Name:  [facility] Phone number:  [phone] Fax number:  [phone]   Type of Clearance Requested:   - Pharmacy:  Hold Rivaroxaban (Xarelto) HOLD FOR 3 DASY BEFORE PROCEDURE   Type of Anesthesia:   PROPOFOL   Additional requests/questions:    SignedMichaelle Copas   12/13/2023, 5:27 PM

## 2023-12-14 NOTE — Telephone Encounter (Signed)
Callback team please contact patient's PCP for copy of latest CBC and BMET.  If unable to obtain updated lab work please order CBC and BMET to be completed prior to guidance being given for holding Xarelto.  Thank you

## 2023-12-14 NOTE — Telephone Encounter (Signed)
Pharmacy please advise on holding Xarelto prior to colonoscopy scheduled for TBD. Thank you.

## 2023-12-17 NOTE — Telephone Encounter (Signed)
Patient with diagnosis of atrial fibrillation on Xarelto for anticoagulation.    Procedure:   COLONOSCOPY/ENDOSCOPY   Date of Surgery:  Clearance TBD    CHA2DS2-VASc Score = 5   This indicates a 7.2% annual risk of stroke. The patient's score is based upon: CHF History: 0 HTN History: 1 Diabetes History: 1 Stroke History: 0 Vascular Disease History: 1 Age Score: 2 Gender Score: 0    CrCl 104 Platelet count  - no current lab on file  Will need updated CBC for final authorization, if platelet count WNL will be okay to hold Xarelto for 3 days    Patient will not need bridging with Lovenox (enoxaparin) around procedure.  **This guidance is not considered finalized until pre-operative APP has relayed final recommendations.**

## 2023-12-17 NOTE — Telephone Encounter (Signed)
Spoke with patient who is going to call our office back after he sees he other doctor on 12/27/23 to determine if they will do blood work or not.

## 2023-12-17 NOTE — Telephone Encounter (Signed)
Preoperative team, please order CBC.  Once updated labs have resulted we will be able to provide recommendations for upcoming procedure.  Thank you for your help.  Thomasene Ripple. Delayne Sanzo NP-C     12/17/2023, 4:46 PM [facility] [address] Office [phone] Fax [phone]

## 2023-12-27 DIAGNOSIS — I4891 Unspecified atrial fibrillation: Secondary | ICD-10-CM | POA: Diagnosis not present

## 2023-12-27 DIAGNOSIS — Z7901 Long term (current) use of anticoagulants: Secondary | ICD-10-CM | POA: Diagnosis not present

## 2023-12-27 DIAGNOSIS — K921 Melena: Secondary | ICD-10-CM | POA: Diagnosis not present

## 2023-12-27 DIAGNOSIS — D649 Anemia, unspecified: Secondary | ICD-10-CM | POA: Diagnosis not present

## 2023-12-27 DIAGNOSIS — Z860101 Personal history of adenomatous and serrated colon polyps: Secondary | ICD-10-CM | POA: Diagnosis not present

## 2023-12-27 DIAGNOSIS — R6881 Early satiety: Secondary | ICD-10-CM | POA: Diagnosis not present

## 2023-12-30 DIAGNOSIS — G4733 Obstructive sleep apnea (adult) (pediatric): Secondary | ICD-10-CM | POA: Diagnosis not present

## 2024-01-02 ENCOUNTER — Other Ambulatory Visit: Payer: Self-pay | Admitting: *Deleted

## 2024-01-02 DIAGNOSIS — Z01818 Encounter for other preprocedural examination: Secondary | ICD-10-CM

## 2024-01-02 NOTE — Telephone Encounter (Signed)
 Helping in preop today. Given duration of time since previous reply (2 weeks ago) will route to preop to assist in the question below. In addition to CBC would also recommend BMET as we need an updated creatinine on file as well.

## 2024-01-02 NOTE — Telephone Encounter (Signed)
 Call placed to pt, he will come to [facility] tomorrow, 01/03/24 for BMET & CBC.

## 2024-01-02 NOTE — Addendum Note (Signed)
 Addended by: Burnetta Sabin on: 01/02/2024 02:39 PM   Modules accepted: Orders

## 2024-01-03 DIAGNOSIS — Z01818 Encounter for other preprocedural examination: Secondary | ICD-10-CM | POA: Diagnosis not present

## 2024-01-04 LAB — CBC
Hematocrit: 40.4 % (ref 37.5–51.0)
Hemoglobin: 12.8 g/dL — ABNORMAL LOW (ref 13.0–17.7)
MCH: 27.7 pg (ref 26.6–33.0)
MCHC: 31.7 g/dL (ref 31.5–35.7)
MCV: 87 fL (ref 79–97)
Platelets: 188 10*3/uL (ref 150–450)
RBC: 4.62 x10E6/uL (ref 4.14–5.80)
RDW: 13.7 % (ref 11.6–15.4)
WBC: 4.7 10*3/uL (ref 3.4–10.8)

## 2024-01-04 LAB — BASIC METABOLIC PANEL
BUN/Creatinine Ratio: 11 (ref 10–24)
BUN: 11 mg/dL (ref 8–27)
CO2: 21 mmol/L (ref 20–29)
Calcium: 8.9 mg/dL (ref 8.6–10.2)
Chloride: 101 mmol/L (ref 96–106)
Creatinine, Ser: 0.99 mg/dL (ref 0.76–1.27)
Glucose: 241 mg/dL — ABNORMAL HIGH (ref 70–99)
Potassium: 4.2 mmol/L (ref 3.5–5.2)
Sodium: 137 mmol/L (ref 134–144)
eGFR: 78 mL/min/{1.73_m2} (ref >59)

## 2024-01-04 NOTE — Telephone Encounter (Signed)
 Labs now available for preop team

## 2024-01-04 NOTE — Telephone Encounter (Signed)
   Patient Name: Kevin [name]  DOB: 22-Aug-1946 MRN: [id number]  Primary Cardiologist: Redell Shallow, MD  Chart reviewed as part of pre-operative protocol coverage. Pre-op clearance already addressed by colleagues in earlier phone notes. To summarize recommendations:  -Platelet count WNL, 188k. He can hold xarelto  x 3 days prior to his colonoscopy and resume when medically safe to do so.  No medical clearance requested.  Will route this bundled recommendation to requesting provider via Epic fax function and remove from pre-op pool. Please call with questions.  Orren LOISE Fabry, PA-C 01/04/2024, 8:35 AM

## 2024-01-16 DIAGNOSIS — M545 Low back pain, unspecified: Secondary | ICD-10-CM | POA: Diagnosis not present

## 2024-01-16 DIAGNOSIS — D509 Iron deficiency anemia, unspecified: Secondary | ICD-10-CM | POA: Diagnosis not present

## 2024-01-16 DIAGNOSIS — I2510 Atherosclerotic heart disease of native coronary artery without angina pectoris: Secondary | ICD-10-CM | POA: Diagnosis not present

## 2024-01-16 DIAGNOSIS — R600 Localized edema: Secondary | ICD-10-CM | POA: Diagnosis not present

## 2024-01-16 DIAGNOSIS — G4733 Obstructive sleep apnea (adult) (pediatric): Secondary | ICD-10-CM | POA: Diagnosis not present

## 2024-01-16 DIAGNOSIS — Z0000 Encounter for general adult medical examination without abnormal findings: Secondary | ICD-10-CM | POA: Diagnosis not present

## 2024-01-16 DIAGNOSIS — Z1331 Encounter for screening for depression: Secondary | ICD-10-CM | POA: Diagnosis not present

## 2024-01-16 DIAGNOSIS — D6869 Other thrombophilia: Secondary | ICD-10-CM | POA: Diagnosis not present

## 2024-01-16 DIAGNOSIS — E7800 Pure hypercholesterolemia, unspecified: Secondary | ICD-10-CM | POA: Diagnosis not present

## 2024-01-16 DIAGNOSIS — I10 Essential (primary) hypertension: Secondary | ICD-10-CM | POA: Diagnosis not present

## 2024-01-16 DIAGNOSIS — Z79899 Other long term (current) drug therapy: Secondary | ICD-10-CM | POA: Diagnosis not present

## 2024-01-16 DIAGNOSIS — I480 Paroxysmal atrial fibrillation: Secondary | ICD-10-CM | POA: Diagnosis not present

## 2024-01-16 DIAGNOSIS — N400 Enlarged prostate without lower urinary tract symptoms: Secondary | ICD-10-CM | POA: Diagnosis not present

## 2024-01-16 DIAGNOSIS — I700 Atherosclerosis of aorta: Secondary | ICD-10-CM | POA: Diagnosis not present

## 2024-01-21 DIAGNOSIS — N200 Calculus of kidney: Secondary | ICD-10-CM | POA: Diagnosis not present

## 2024-02-05 DIAGNOSIS — Z860101 Personal history of adenomatous and serrated colon polyps: Secondary | ICD-10-CM | POA: Diagnosis not present

## 2024-02-05 DIAGNOSIS — Z09 Encounter for follow-up examination after completed treatment for conditions other than malignant neoplasm: Secondary | ICD-10-CM | POA: Diagnosis not present

## 2024-02-05 DIAGNOSIS — D128 Benign neoplasm of rectum: Secondary | ICD-10-CM | POA: Diagnosis not present

## 2024-02-05 DIAGNOSIS — D649 Anemia, unspecified: Secondary | ICD-10-CM | POA: Diagnosis not present

## 2024-02-05 DIAGNOSIS — D123 Benign neoplasm of transverse colon: Secondary | ICD-10-CM | POA: Diagnosis not present

## 2024-02-05 DIAGNOSIS — D120 Benign neoplasm of cecum: Secondary | ICD-10-CM | POA: Diagnosis not present

## 2024-02-05 DIAGNOSIS — K3189 Other diseases of stomach and duodenum: Secondary | ICD-10-CM | POA: Diagnosis not present

## 2024-02-05 DIAGNOSIS — D122 Benign neoplasm of ascending colon: Secondary | ICD-10-CM | POA: Diagnosis not present

## 2024-02-05 DIAGNOSIS — K317 Polyp of stomach and duodenum: Secondary | ICD-10-CM | POA: Diagnosis not present

## 2024-02-05 DIAGNOSIS — K2930 Chronic superficial gastritis without bleeding: Secondary | ICD-10-CM | POA: Diagnosis not present

## 2024-02-12 DIAGNOSIS — K317 Polyp of stomach and duodenum: Secondary | ICD-10-CM | POA: Diagnosis not present

## 2024-02-12 DIAGNOSIS — D120 Benign neoplasm of cecum: Secondary | ICD-10-CM | POA: Diagnosis not present

## 2024-02-12 DIAGNOSIS — D128 Benign neoplasm of rectum: Secondary | ICD-10-CM | POA: Diagnosis not present

## 2024-02-12 DIAGNOSIS — D123 Benign neoplasm of transverse colon: Secondary | ICD-10-CM | POA: Diagnosis not present

## 2024-02-12 DIAGNOSIS — D122 Benign neoplasm of ascending colon: Secondary | ICD-10-CM | POA: Diagnosis not present

## 2024-02-12 DIAGNOSIS — K2930 Chronic superficial gastritis without bleeding: Secondary | ICD-10-CM | POA: Diagnosis not present

## 2024-02-20 DIAGNOSIS — M5416 Radiculopathy, lumbar region: Secondary | ICD-10-CM | POA: Diagnosis not present

## 2024-02-20 DIAGNOSIS — M961 Postlaminectomy syndrome, not elsewhere classified: Secondary | ICD-10-CM | POA: Diagnosis not present

## 2024-02-20 DIAGNOSIS — G894 Chronic pain syndrome: Secondary | ICD-10-CM | POA: Diagnosis not present

## 2024-02-20 DIAGNOSIS — F1120 Opioid dependence, uncomplicated: Secondary | ICD-10-CM | POA: Diagnosis not present

## 2024-04-01 DIAGNOSIS — G4733 Obstructive sleep apnea (adult) (pediatric): Secondary | ICD-10-CM | POA: Diagnosis not present

## 2024-04-15 DIAGNOSIS — Z862 Personal history of diseases of the blood and blood-forming organs and certain disorders involving the immune mechanism: Secondary | ICD-10-CM | POA: Diagnosis not present

## 2024-05-14 ENCOUNTER — Other Ambulatory Visit: Payer: Self-pay | Admitting: Cardiology

## 2024-05-14 DIAGNOSIS — G894 Chronic pain syndrome: Secondary | ICD-10-CM | POA: Diagnosis not present

## 2024-05-14 DIAGNOSIS — I4891 Unspecified atrial fibrillation: Secondary | ICD-10-CM

## 2024-05-14 DIAGNOSIS — F1120 Opioid dependence, uncomplicated: Secondary | ICD-10-CM | POA: Diagnosis not present

## 2024-05-14 NOTE — Telephone Encounter (Signed)
 Prescription refill request for Xarelto  received.  Indication: PAF Last office visit: 08/14/23  Isobel Marie MD Weight: 112kg Age: 78 Scr:  0.99 on 01/03/24  Epic CrCl: 97.42  Based on above findings Xarelto  20mg  daily is the appropriate dose.  Refill approved.

## 2024-05-23 DIAGNOSIS — M545 Low back pain, unspecified: Secondary | ICD-10-CM | POA: Diagnosis not present

## 2024-05-30 ENCOUNTER — Encounter: Payer: Self-pay | Admitting: Cardiology

## 2024-07-03 DIAGNOSIS — G4733 Obstructive sleep apnea (adult) (pediatric): Secondary | ICD-10-CM | POA: Diagnosis not present

## 2024-07-07 DIAGNOSIS — Z008 Encounter for other general examination: Secondary | ICD-10-CM | POA: Diagnosis not present

## 2024-07-16 DIAGNOSIS — Z79899 Other long term (current) drug therapy: Secondary | ICD-10-CM | POA: Diagnosis not present

## 2024-07-16 DIAGNOSIS — Z862 Personal history of diseases of the blood and blood-forming organs and certain disorders involving the immune mechanism: Secondary | ICD-10-CM | POA: Diagnosis not present

## 2024-08-20 DIAGNOSIS — F1120 Opioid dependence, uncomplicated: Secondary | ICD-10-CM | POA: Diagnosis not present

## 2024-08-20 DIAGNOSIS — G894 Chronic pain syndrome: Secondary | ICD-10-CM | POA: Diagnosis not present

## 2024-08-20 DIAGNOSIS — M961 Postlaminectomy syndrome, not elsewhere classified: Secondary | ICD-10-CM | POA: Diagnosis not present

## 2024-09-03 DIAGNOSIS — H04123 Dry eye syndrome of bilateral lacrimal glands: Secondary | ICD-10-CM | POA: Diagnosis not present

## 2024-09-03 DIAGNOSIS — Z961 Presence of intraocular lens: Secondary | ICD-10-CM | POA: Diagnosis not present

## 2024-09-03 DIAGNOSIS — Z23 Encounter for immunization: Secondary | ICD-10-CM | POA: Diagnosis not present

## 2024-09-03 DIAGNOSIS — E119 Type 2 diabetes mellitus without complications: Secondary | ICD-10-CM | POA: Diagnosis not present

## 2024-09-03 DIAGNOSIS — H02831 Dermatochalasis of right upper eyelid: Secondary | ICD-10-CM | POA: Diagnosis not present

## 2024-09-03 DIAGNOSIS — D492 Neoplasm of unspecified behavior of bone, soft tissue, and skin: Secondary | ICD-10-CM | POA: Diagnosis not present

## 2024-09-03 DIAGNOSIS — H02834 Dermatochalasis of left upper eyelid: Secondary | ICD-10-CM | POA: Diagnosis not present

## 2024-09-25 ENCOUNTER — Other Ambulatory Visit: Payer: Self-pay | Admitting: Cardiology

## 2024-10-23 ENCOUNTER — Other Ambulatory Visit: Payer: Self-pay | Admitting: Cardiology

## 2024-11-08 ENCOUNTER — Other Ambulatory Visit: Payer: Self-pay | Admitting: Cardiology

## 2024-11-10 ENCOUNTER — Other Ambulatory Visit: Payer: Self-pay | Admitting: Cardiology

## 2024-11-12 ENCOUNTER — Other Ambulatory Visit: Payer: Self-pay

## 2024-11-12 DIAGNOSIS — I4891 Unspecified atrial fibrillation: Secondary | ICD-10-CM

## 2024-11-12 MED ORDER — RIVAROXABAN 20 MG PO TABS
20.0000 mg | ORAL_TABLET | Freq: Every day | ORAL | 0 refills | Status: DC
Start: 2024-11-12 — End: 2024-11-19

## 2024-11-12 NOTE — Telephone Encounter (Signed)
 Prescription refill request for Xarelto  received.  Indication: Last office visit:needs appt Weight: Age: Scr: CrCl: Prescription refilled

## 2024-11-18 DIAGNOSIS — M961 Postlaminectomy syndrome, not elsewhere classified: Secondary | ICD-10-CM | POA: Diagnosis not present

## 2024-11-18 DIAGNOSIS — M5416 Radiculopathy, lumbar region: Secondary | ICD-10-CM | POA: Diagnosis not present

## 2024-11-18 DIAGNOSIS — F1120 Opioid dependence, uncomplicated: Secondary | ICD-10-CM | POA: Diagnosis not present

## 2024-11-18 DIAGNOSIS — G894 Chronic pain syndrome: Secondary | ICD-10-CM | POA: Diagnosis not present

## 2024-11-19 ENCOUNTER — Telehealth: Payer: Self-pay | Admitting: Cardiology

## 2024-11-19 ENCOUNTER — Telehealth (HOSPITAL_BASED_OUTPATIENT_CLINIC_OR_DEPARTMENT_OTHER): Payer: Self-pay | Admitting: *Deleted

## 2024-11-19 DIAGNOSIS — I4891 Unspecified atrial fibrillation: Secondary | ICD-10-CM

## 2024-11-19 MED ORDER — RIVAROXABAN 20 MG PO TABS: 20.0000 mg | ORAL_TABLET | Freq: Every day | ORAL | 0 refills | Status: DC

## 2024-11-19 NOTE — Telephone Encounter (Signed)
 Patient has appt 12/11/24 with Josefa Beauvais, NP and at that time preop clearance will be addressed.  Will update surgeons office.

## 2024-11-19 NOTE — Telephone Encounter (Signed)
 Pt c/o medication issue:  1. Name of Medication:   metoprolol  tartrate (LOPRESSOR ) 50 MG tablet  rivaroxaban  (XARELTO ) 20 MG TABS tablet   2. How are you currently taking this medication (dosage and times per day)? Unknown   3. Are you having a reaction (difficulty breathing--STAT)? No   4. What is your medication issue? Pt wanted to verify medication and dose please advise

## 2024-11-19 NOTE — Telephone Encounter (Signed)
   Name: Kevin [name]  DOB: 10/23/1946  MRN: [id number]  Primary Cardiologist: Redell Shallow, MD  Chart reviewed as part of pre-operative protocol coverage. The patient has an upcoming visit scheduled with Josefa Beauvais, NP on 12/11/24 at which time clearance can be addressed in case there are any issues that would impact surgical recommendations.  L1-2-ESI- Right is not scheduled as below. I added preop FYI to appointment note so that provider is aware to address at time of outpatient visit.  Per office protocol the cardiology provider should forward their finalized clearance decision and recommendations regarding antiplatelet therapy to the requesting party below.    This message will also be routed to pharmacy pool for input on holding Xarelto  as requested below so that this information is available to the clearing provider at time of patient's appointment.   I will route this message as FYI to requesting party and remove this message from the preop box as separate preop APP input not needed at this time.   Please call with any questions.  Brayam Boeke D Gitty Osterlund, NP  11/19/2024, 2:25 PM

## 2024-11-19 NOTE — Telephone Encounter (Signed)
   Pre-operative Risk Assessment    Patient Name: Kevin [name]  DOB: April 22, 1946 MRN: [id number]   Date of last office visit: 08/14/23 DR. CRENSHAW Date of next office visit: NONE   Request for Surgical Clearance    Procedure:  L1-2-ESI-RIGHT  Date of Surgery:  Clearance TBD                                Surgeon:  DR. DAVE [facility] Surgeon's Group or Practice Name:  [facility] Phone number:  [phone] Fax number:  [phone]   Type of Clearance Requested:   - Medical  - Pharmacy:  Hold Rivaroxaban  (Xarelto ) x 3 DAYS AND RESUME THE NEXT DAY   Type of Anesthesia:  Not Indicated   Additional requests/questions:    Bonney Niels Jest   11/19/2024, 2:08 PM

## 2024-11-19 NOTE — Telephone Encounter (Signed)
 Patient with diagnosis of afib on Xarelto  for anticoagulation.    Procedure: L1-2-ESI-RIGHT  Date of procedure: TBD   CHA2DS2-VASc Score = 5   This indicates a 7.2% annual risk of stroke. The patient's score is based upon: CHF History: 0 HTN History: 1 Diabetes History: 1 Stroke History: 0 Vascular Disease History: 1 Age Score: 2 Gender Score: 0      CrCl 79 ml/min Platelet count 188  Patient has not had an Afib/aflutter ablation in the last 3 months, DCCV within the last 4 weeks or a watchman implanted in the last 45 days   Per office protocol, patient can hold Xarelto  for 3 days prior to procedure.    **This guidance is not considered finalized until pre-operative APP has relayed final recommendations.**

## 2024-11-19 NOTE — Telephone Encounter (Signed)
 Spoke to patient advised he has appointment with Josefa Beauvais NP 12/18 at 2:45 pm for clearance.Advised he should be taking Xarelto .I will send a 30 day supply until his appointment.

## 2024-11-26 ENCOUNTER — Other Ambulatory Visit: Payer: Self-pay | Admitting: Cardiology

## 2024-12-09 NOTE — Progress Notes (Unsigned)
 Cardiology Clinic Note   Patient Name: Kevin [name] Date of Encounter: 12/11/2024  Primary Care Provider:  Dwight Trula SQUIBB, MD Primary Cardiologist:  Redell Shallow, MD  Patient Profile    Kevin [name] 78 year old male presents to the clinic today for follow-up evaluation of his PAF and essential hypertension.  Past Medical History    Past Medical History:  Diagnosis Date   Diabetes mellitus    Fall    FUO (fever of unknown origin) 01/19/2016   GERD (gastroesophageal reflux disease)    History of hiatal hernia    Hypertension    Occasional tremors    Prostatitis 01/19/2016   Sleep apnea    . Last test was before 2007 , not sure name of test site. uses CPAP   Syncope    Wears glasses    Past Surgical History:  Procedure Laterality Date   BACK SURGERY     CHOLECYSTECTOMY N/A 06/15/2016   Procedure: LAPAROSCOPIC CHOLECYSTECTOMY WITH INTRAOPERATIVE CHOLANGIOGRAM;  Surgeon: Krystal Spinner, MD;  Location: [facility];  Service: General;  Laterality: N/A;   Colonoscopy  2011   EXTRACORPOREAL SHOCK WAVE LITHOTRIPSY Right 05/29/2022   Procedure: EXTRACORPOREAL SHOCK WAVE LITHOTRIPSY (ESWL);  Surgeon: Watt Rush, MD;  Location: [facility];  Service: Urology;  Laterality: Right;   EYE SURGERY     Cataract 2013   HIATAL HERNIA REPAIR     x2   KNEE ARTHROSCOPY     bil   LUMBAR FUSION  2013   PATELLA FRACTURE SURGERY      Allergies  Allergies[1]  History of Present Illness    Kevin [name] has a PMH of paroxysmal atrial fibrillation and primary hypertension.  He was admitted with encephalopathy and fever.  He was found to have PNA.  He developed atrial fibrillation and converted back to normal sinus rhythm.  His echocardiogram 3/21 showed normal LV function, G1 DD, mild left ventricular hypertrophy, mild left atrial enlargement, trace aortic and mitral regurgitation.   He was seen in follow-up by Dr. Shallow 03/17/2022.  During that time he noted increased work  of breathing with increased physical activity.  His breathing was normal at rest.  He denied exertional chest discomfort.  He denied orthopnea PND and lower extremity swelling.  He denied presyncope and palpitations.     He presented to the clinic 06/27/23 for follow-up evaluation and stated he had fallen off of a stepstool.  He required stitches in his left great toe.  His left leg was slightly bigger than his right.  We reviewed his echocardiogram from 2021.  We reviewed his upcoming epidural spinal injection and recommendations for holding his Xarelto . He remained stable from a cardiac standpoint.  He was seen in follow-up by Dr. Shallow on 08/14/2023.  During that time he continued to be stable from a cardiac standpoint.  His Xarelto  was continued.  His blood pressure was well-controlled.  He presents to the clinic today for follow-up evaluation and states he feels well.  He is fairly sedentary.  He presents with his wife.  We reviewed his previous cardiac visit.  He expressed understanding.  Initially his heart rate is elevated at 120 with ambulation in the room and movement to the exam table.  On exam his pulse is noted to be in the 60s-70s.  His repeat EKG showed atrial fibrillation with a rate of 107.  I will increase his metoprolol  to 75 mg in the a.m. and continue 25 mg metoprolol   in the p.m.  We will plan follow-up in 3 to 4 months..   Today he denies chest pain, shortness of breath, lower extremity edema, fatigue, palpitations, melena, hematuria, hemoptysis, diaphoresis, weakness, presyncope, syncope, orthopnea, and PND.   Home Medications    Prior to Admission medications  Medication Sig Start Date End Date Taking? Authorizing Provider  Acai 500 MG CAPS Take 1 capsule by mouth. Every 3 days    [provider]  amLODipine  (NORVASC ) 5 MG tablet Take 5 mg by mouth daily. 02/17/20   [provider]  atorvastatin  (LIPITOR ) 80 MG tablet Take 80 mg by mouth at bedtime.      [provider]  chlorhexidine  (PERIDEX) 0.12 % solution Use as directed 15 mLs in the mouth or throat 2 (two) times daily. 06/06/23   [provider]  clotrimazole (LOTRIMIN) 1 % cream Apply 1 Application topically 2 (two) times daily.    [provider]  ferrous sulfate  325 (65 FE) MG EC tablet Take 325 mg by mouth 3 (three) times a week. Takes Mon wed and Musician, Historical, MD  furosemide  (LASIX ) 20 MG tablet Take 20 mg by mouth as needed. 08/08/23   [provider]  JARDIANCE 10 MG TABS tablet Take 10 mg by mouth daily. 07/24/23   [provider]  losartan  (COZAAR ) 100 MG tablet Take 100 mg by mouth daily.    [provider]  metFORMIN  (GLUCOPHAGE ) 1000 MG tablet Take 1,000 mg by mouth 2 (two) times daily with a meal.     [provider]  metoprolol  tartrate (LOPRESSOR ) 50 MG tablet Take 1 tablet (50 mg total) by mouth 2 (two) times daily. Pt must keep upcoming followup appt with Cardiology in December 2025 for any more refills. Thank You 12/02/24   Pietro Redell RAMAN, MD  Multiple Vitamins-Minerals (WOMENS 50+ MULTI VITAMIN PO) Take 1 tablet by mouth daily.    [provider]  NOVOLIN 70/30 (70-30) 100 UNIT/ML injection Inject 45 Units into the skin 2 (two) times daily with a meal.  02/25/16   [provider]  omeprazole (PRILOSEC) 40 MG capsule Take 40 mg by mouth daily.    [provider]  [facility] VERIO test strip 1 each in the morning, at noon, and at bedtime. 04/18/23   [provider]  oxybutynin (DITROPAN-XL) 5 MG 24 hr tablet Take 5 mg by mouth at bedtime.    [provider]  Oxycodone  HCl 10 MG TABS Take 10 mg by mouth every 4 (four) hours as needed (for pain).  02/22/20   [provider]  OZEMPIC, 0.25 OR 0.5 MG/DOSE, 2 MG/3ML SOPN Inject into the skin once a week. 08/07/23   [provider]  potassium chloride  (KLOR-CON ) 20 MEQ packet Take by mouth 2 (two)  times daily. As needed    [provider]  pregabalin (LYRICA) 50 MG capsule Take 50 mg by mouth daily.    [provider]  Resveratrol 100 MG CAPS Take by mouth. Every other 3rd day    [provider]  rivaroxaban  (XARELTO ) 20 MG TABS tablet Take 1 tablet (20 mg total) by mouth daily with supper. Keep appointment with Josefa Beauvais NP 12/18 at 2:45 pm 11/19/24   Beauvais Josefa HERO, NP  tadalafil (CIALIS) 5 MG tablet Take 5 mg by mouth daily as needed for erectile dysfunction.    [provider]  tamsulosin  (FLOMAX ) 0.4 MG CAPS capsule Take 0.4 mg by mouth  in the morning and at bedtime. 12/28/19   [provider]  Turmeric 500 MG CAPS Take by mouth. TAKE EVERY THIRD DAY    [provider]  vitamin B-12 (CYANOCOBALAMIN ) 1000 MCG tablet Take 1,000 mcg by mouth 3 (three) times a week.    [provider]    Family History    Family History  Problem Relation Age of Onset   Coronary artery disease Other    He indicated that the status of his other is unknown.  Social History    Social History   Socioeconomic History   Marital status: Married    Spouse name: Not on file   Number of children: Not on file   Years of education: Not on file   Highest education level: Not on file  Occupational History   Not on file  Tobacco Use   Smoking status: Former    Current packs/day: 0.00    Average packs/day: 1 pack/day for 4.0 years (4.0 ttl pk-yrs)    Types: Cigarettes    Start date: 09/04/1976    Quit date: 09/04/1980    Years since quitting: 44.2   Smokeless tobacco: Never  Substance and Sexual Activity   Alcohol use: No    Alcohol/week: 0.0 standard drinks of alcohol    Comment: rarely   Drug use: No   Sexual activity: Not on file  Other Topics Concern   Not on file  Social History Narrative   Not on file   Social Drivers of Health   Tobacco Use: Medium Risk (12/11/2024)   Patient History    Smoking Tobacco Use: Former     Smokeless Tobacco Use: Never    Passive Exposure: Not on Actuary Strain: Low Risk  (10/19/2023)   Received from [facility]   Overall Financial Resource Strain (CARDIA)    Difficulty of Paying Living Expenses: Not hard at all  Food Insecurity: No Food Insecurity (10/19/2023)   Received from [facility]   Epic    Within the past 12 months, you worried that your food would run out before you got the money to buy more.: Never true    Within the past 12 months, the food you bought just didn't last and you didn't have money to get more.: Never true  Transportation Needs: No Transportation Needs (10/19/2023)   Received from [facility] - Transportation    In the past 12 months, has lack of transportation kept you from medical appointments or from getting medications?: No    Lack of Transportation (Non-Medical): No  Physical Activity: Not on file  Stress: Not on file  Social Connections: Not on file  Intimate Partner Violence: Not on file  Depression (EYV7-0): Not on file  Alcohol Screen: Not on file  Housing: Unknown (10/19/2023)   Received from [facility]   Epic    In the last 12 months, was there a time when you were not able to pay the mortgage or rent on time?: No    Number of Times Moved in the Last Year: Not on file    At any time in the past 12 months, were you homeless or living in a shelter (including now)?: No  Utilities: Not on file  Health Literacy: Not on file     Review of Systems    General:  No chills, fever, night sweats or weight changes.  Cardiovascular:  No chest pain, dyspnea  on exertion, edema, orthopnea, palpitations, paroxysmal nocturnal dyspnea. Dermatological: No rash, lesions/masses Respiratory: No cough, dyspnea Urologic: No hematuria, dysuria Abdominal:   No nausea, vomiting, diarrhea, bright red blood per rectum, melena, or hematemesis Neurologic:  No  visual changes, wkns, changes in mental status. All other systems reviewed and are otherwise negative except as noted above.  Physical Exam    VS:  BP 130/74   Pulse 64   Ht 6' (1.829 m)   Wt 242 lb (109.8 kg)   SpO2 96%   BMI 32.82 kg/m  , BMI Body mass index is 32.82 kg/m. GEN: Well nourished, well developed, in no acute distress. HEENT: normal. Neck: Supple, no JVD, carotid bruits, or masses. Cardiac: Irregularly irregular, no murmurs, rubs, or gallops. No clubbing, cyanosis, edema.  Radials/DP/PT 2+ and equal bilaterally.  Respiratory:  Respirations regular and unlabored, clear to auscultation bilaterally. GI: Soft, nontender, nondistended, BS + x 4. MS: no deformity or atrophy. Skin: warm and dry, no rash. Neuro:  Strength and sensation are intact. Psych: Normal affect.  Accessory Clinical Findings    Recent Labs: 01/03/2024: BUN 11; Creatinine, Ser 0.99; Hemoglobin 12.8; Platelets 188; Potassium 4.2; Sodium 137   Recent Lipid Panel No results found for: CHOL, TRIG, HDL, CHOLHDL, VLDL, LDLCALC, LDLDIRECT       ECG personally reviewed by me today- EKG Interpretation Date/Time:  Thursday December 11 2024 15:08:16 EST Ventricular Rate:  107 PR Interval:    QRS Duration:  82 QT Interval:  310 QTC Calculation: 413 R Axis:   -29  Text Interpretation: Atrial fibrillation with rapid ventricular response Nonspecific T wave abnormality When compared with ECG of 11-Dec-2024 14:36, Atrial fibrillation has replaced Atrial flutter Incomplete right bundle branch block is no longer Present Confirmed by Emelia Hazy [phone]) on 12/11/2024 3:09:46 PM   Echocardiogram 02/27/2020   IMPRESSIONS     1. Normal LV systolic function; grade 1 diastolic dysfunction; mild LVH;  mildly dilated aortic root; mild LAE; trace AI and MR.   2. Left ventricular ejection fraction, by estimation, is 55 to 60%. The  left ventricle has normal function. The left ventricle has no regional   wall motion abnormalities. There is mild left ventricular hypertrophy.  Left ventricular diastolic parameters  are consistent with Grade I diastolic dysfunction (impaired relaxation).   3. Right ventricular systolic function is normal. The right ventricular  size is normal.   4. Left atrial size was mildly dilated.   5. The mitral valve is normal in structure and function. Trivial mitral  valve regurgitation. No evidence of mitral stenosis.   6. The aortic valve has an indeterminant number of cusps. Aortic valve  regurgitation is trivial. No aortic stenosis is present.   7. Aortic dilatation noted. 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   FINDINGS   Left Ventricle: Left ventricular ejection fraction, by estimation, is 55  to 60%. The left ventricle has normal function. The left ventricle has no  regional wall motion abnormalities. The left ventricular internal cavity  size was normal in size. There is   mild left ventricular hypertrophy. Left ventricular diastolic parameters  are consistent with Grade I diastolic dysfunction (impaired relaxation).   Right Ventricle: The right ventricular size is normal. Right ventricular  systolic function is normal.   Left Atrium: Left atrial size was mildly dilated.   Right Atrium: Right atrial size was normal in size.   Pericardium: There is no evidence of pericardial effusion.  Mitral Valve: The mitral valve is normal in structure and function. Normal  mobility of the mitral valve leaflets. Trivial mitral valve regurgitation.  No evidence of mitral valve stenosis.   Tricuspid Valve: The tricuspid valve is normal in structure. Tricuspid  valve regurgitation is trivial. No evidence of tricuspid stenosis.   Aortic Valve: The aortic valve has an indeterminant number of cusps.  Aortic valve regurgitation is trivial. Aortic regurgitation PHT measures  960 msec. No aortic stenosis is present. Aortic valve mean gradient   measures 4.0 mmHg. Aortic valve peak  gradient measures 7.0 mmHg. Aortic valve area, by VTI measures 2.77 cm.   Pulmonic Valve: The pulmonic valve was not well visualized. Pulmonic valve  regurgitation is not visualized. No evidence of pulmonic stenosis.   Aorta: Aortic dilatation noted. There is mild dilatation of the aortic  root measuring 40 mm.   Venous: The inferior vena cava was not well visualized.      Assessment & Plan   1.  Paroxysmal atrial fibrillation-EKG today shows atrial fibrillation/atrial flutter 107 bpm.  Asymptomatic.  Cardiac unaware. Avoid triggers caffeine, chocolate, EtOH, dehydration excetra. Continue metoprolol , Xarelto  Increase metoprolol  to 75 mg a.m. and continue 25 mg p.m.  Essential hypertension-BP today 130/74. Maintain blood pressure log Continue amlodipine , losartan , metoprolol  Heart healthy low-sodium diet  Preoperative cardiac evaluation-DR. DAVE [facility] Surgeon's Group or Practice Name:  [facility] Phone number:  [phone] Fax number:  [phone]     Primary Cardiologist: Redell Shallow, MD  Chart reviewed as part of pre-operative protocol coverage. Given past medical history and time since last visit, based on ACC/AHA guidelines, Kevin [name] would be at acceptable risk for the planned procedure without further cardiovascular testing.   Patient was advised that if he develops new symptoms prior to surgery to contact our office to arrange a follow-up appointment.  He verbalized understanding.  His Xarelto  may be held for 3 days prior to his injection.  Please resume as soon as hemostasis is achieved.  I will route this recommendation to the requesting party via Epic fax function and remove from pre-op pool.  Please call with questions.      Disposition: Follow-up with Dr. Shallow or me in 3-4 months.   Josefa HERO. Kevin Mcclarty NP-C     12/11/2024, 3:21 PM [facility] [address] [place], [place] [address] Office [phone]    Notice: This dictation was prepared with Dragon dictation along with smaller phrase technology. Any transcriptional errors that result from this process are unintentional and may not be corrected upon review.   I spent 14 minutes examining this patient, reviewing medications, and using patient centered shared decision making involving their cardiac care.   I spent  20 minutes reviewing past medical history,  medications, and prior cardiac tests.     [1]  Allergies Allergen Reactions   Empagliflozin     Other Reaction(s): dizziness   Gabapentin Other (See Comments)

## 2024-12-11 ENCOUNTER — Encounter: Payer: Self-pay | Admitting: General Practice

## 2024-12-11 ENCOUNTER — Ambulatory Visit: Attending: Cardiovascular Disease | Admitting: General Practice

## 2024-12-11 VITALS — BP 130/74 | HR 64 | Ht 72.0 in | Wt 242.0 lb

## 2024-12-11 DIAGNOSIS — Z01818 Encounter for other preprocedural examination: Secondary | ICD-10-CM | POA: Diagnosis not present

## 2024-12-11 DIAGNOSIS — I480 Paroxysmal atrial fibrillation: Secondary | ICD-10-CM | POA: Diagnosis not present

## 2024-12-11 DIAGNOSIS — I10 Essential (primary) hypertension: Secondary | ICD-10-CM

## 2024-12-11 MED ORDER — METOPROLOL TARTRATE 50 MG PO TABS: ORAL_TABLET | ORAL | 1 refills | Status: AC

## 2024-12-11 NOTE — Patient Instructions (Addendum)
 Medication Instructions:  INCREASE Metoprolol  Tartrate 75 mg in the morning and 25 mg in the evening  HOLD Xarelto  3 days prior procedure *If you need a refill on your cardiac medications before your next appointment, please call your pharmacy*  Lab Work: NONE ORDERED  Testing/Procedures: NONE ORDERED  Follow-Up: At [facility], you and your health needs are our priority.  As part of our continuing mission to provide you with exceptional heart care, our providers are all part of one team.  This team includes your primary Cardiologist (physician) and Advanced Practice Providers or APPs (Physician Assistants and Nurse Practitioners) who all work together to provide you with the care you need, when you need it.  Your next appointment:   3-4 month(s)    Provider:   Josefa Beauvais, NP   We recommend signing up for the patient portal called MyChart.  Sign up information is provided on this After Visit Summary.  MyChart is used to connect with patients for Virtual Visits (Telemedicine).  Patients are able to view lab/test results, encounter notes, upcoming appointments, etc.  Non-urgent messages can be sent to your provider as well.   To learn more about what you can do with MyChart, go to [email or web address].

## 2024-12-21 ENCOUNTER — Other Ambulatory Visit: Payer: Self-pay | Admitting: Cardiology

## 2024-12-21 DIAGNOSIS — I4891 Unspecified atrial fibrillation: Secondary | ICD-10-CM

## 2024-12-22 NOTE — Telephone Encounter (Signed)
 Prescription refill request for Xarelto  received.  Indication: PAF Last office visit: 12/11/24  Kevin Beauvais NP Weight: 109.8kg Age: 78 Scr: 0.99 on 01/03/24  Epic CrCl: 93.96  Based on above findings Xarelto  20mg  daily is the appropriate dose.  Refill approved.

## 2025-01-06 ENCOUNTER — Telehealth: Payer: Self-pay | Admitting: Cardiology

## 2025-01-06 NOTE — Telephone Encounter (Signed)
 Office note faxed for clearance via EPIC

## 2025-01-06 NOTE — Telephone Encounter (Signed)
 Caller Jordis) is following-up on patient's clearance and checking that their form will be faxed back.

## 2025-01-29 ENCOUNTER — Telehealth (HOSPITAL_BASED_OUTPATIENT_CLINIC_OR_DEPARTMENT_OTHER): Payer: Self-pay

## 2025-01-29 NOTE — Telephone Encounter (Signed)
"  ° °  Pre-operative Risk Assessment    Patient Name: Kevin [name]  DOB: Mar 01, 1946 MRN: [id number]   Date of last office visit: 12/11/2024 with Josefa Beauvais, NP Date of next office visit: 03/19/2025 with Josefa Beauvais, NP  Request for Surgical Clearance    Procedure:  Colonoscopy   Date of Surgery:  Clearance TBD                                 Surgeon:  Dr. Estelita Manas Surgeon's Group or Practice Name:  [facility]  Phone number:  [phone] Fax number:  [phone]   Type of Clearance Requested:   - Medical  - Pharmacy:  Hold Rivaroxaban  (Xarelto ) -does not specify   Type of Anesthesia:  Propofol    Additional requests/questions:  None  SignedPatrcia Iverson CROME   01/29/2025, 10:29 AM   "

## 2025-03-19 ENCOUNTER — Ambulatory Visit: Admitting: General Practice
